# Patient Record
Sex: Male | Born: 1935
Health system: Southern US, Community
[De-identification: ages and names within clinical notes are randomized; demographics above are authoritative.]

## PROBLEM LIST (undated history)

## (undated) DIAGNOSIS — E7800 Pure hypercholesterolemia, unspecified: Secondary | ICD-10-CM

## (undated) DIAGNOSIS — B351 Tinea unguium: Secondary | ICD-10-CM

## (undated) DIAGNOSIS — R413 Other amnesia: Secondary | ICD-10-CM

## (undated) DIAGNOSIS — I10 Essential (primary) hypertension: Secondary | ICD-10-CM

## (undated) DIAGNOSIS — Z951 Presence of aortocoronary bypass graft: Secondary | ICD-10-CM

## (undated) DIAGNOSIS — F0390 Unspecified dementia without behavioral disturbance: Secondary | ICD-10-CM

## (undated) DIAGNOSIS — C61 Malignant neoplasm of prostate: Secondary | ICD-10-CM

## (undated) DIAGNOSIS — A498 Other bacterial infections of unspecified site: Secondary | ICD-10-CM

## (undated) DIAGNOSIS — I219 Acute myocardial infarction, unspecified: Secondary | ICD-10-CM

## (undated) DIAGNOSIS — N390 Urinary tract infection, site not specified: Secondary | ICD-10-CM

## (undated) DIAGNOSIS — I2510 Atherosclerotic heart disease of native coronary artery without angina pectoris: Secondary | ICD-10-CM

## (undated) DIAGNOSIS — H9190 Unspecified hearing loss, unspecified ear: Secondary | ICD-10-CM

## (undated) DIAGNOSIS — N139 Obstructive and reflux uropathy, unspecified: Secondary | ICD-10-CM

## (undated) HISTORY — DX: Essential (primary) hypertension: I10

## (undated) HISTORY — PX: MULTIPLE TOOTH EXTRACTIONS: SHX2053

## (undated) HISTORY — DX: Tinea unguium: B35.1

## (undated) HISTORY — DX: Pure hypercholesterolemia, unspecified: E78.00

## (undated) HISTORY — PX: CORONARY ARTERY BYPASS GRAFT: SHX141

## (undated) HISTORY — DX: Other bacterial infections of unspecified site: A49.8

## (undated) HISTORY — PX: APPENDECTOMY: SHX54

## (undated) HISTORY — DX: Acute myocardial infarction, unspecified: I21.9

## (undated) HISTORY — DX: Other amnesia: R41.3

## (undated) HISTORY — DX: Obstructive and reflux uropathy, unspecified: N13.9

## (undated) HISTORY — DX: Urinary tract infection, site not specified: N39.0

## (undated) HISTORY — DX: Unspecified hearing loss, unspecified ear: H91.90

---

## 1989-12-21 DIAGNOSIS — I2510 Atherosclerotic heart disease of native coronary artery without angina pectoris: Secondary | ICD-10-CM

## 1989-12-21 HISTORY — DX: Atherosclerotic heart disease of native coronary artery without angina pectoris: I25.10

## 1989-12-27 DIAGNOSIS — Z951 Presence of aortocoronary bypass graft: Secondary | ICD-10-CM

## 1989-12-27 DIAGNOSIS — I2510 Atherosclerotic heart disease of native coronary artery without angina pectoris: Secondary | ICD-10-CM | POA: Diagnosis present

## 1989-12-27 HISTORY — DX: Presence of aortocoronary bypass graft: Z95.1

## 1999-12-05 ENCOUNTER — Encounter: Payer: Self-pay | Admitting: Cardiology

## 1999-12-05 ENCOUNTER — Encounter: Admission: RE | Admit: 1999-12-05 | Discharge: 1999-12-05 | Payer: Self-pay | Admitting: Cardiology

## 2001-12-30 ENCOUNTER — Encounter: Admission: RE | Admit: 2001-12-30 | Discharge: 2001-12-30 | Payer: Self-pay | Admitting: Cardiology

## 2001-12-30 ENCOUNTER — Encounter: Payer: Self-pay | Admitting: Cardiology

## 2002-02-10 ENCOUNTER — Encounter (INDEPENDENT_AMBULATORY_CARE_PROVIDER_SITE_OTHER): Payer: Self-pay | Admitting: Specialist

## 2002-02-10 ENCOUNTER — Ambulatory Visit (HOSPITAL_COMMUNITY): Admission: RE | Admit: 2002-02-10 | Discharge: 2002-02-10 | Payer: Self-pay | Admitting: *Deleted

## 2004-03-21 ENCOUNTER — Encounter (INDEPENDENT_AMBULATORY_CARE_PROVIDER_SITE_OTHER): Payer: Self-pay | Admitting: *Deleted

## 2004-03-21 ENCOUNTER — Ambulatory Visit (HOSPITAL_COMMUNITY): Admission: RE | Admit: 2004-03-21 | Discharge: 2004-03-21 | Payer: Self-pay | Admitting: *Deleted

## 2005-03-07 ENCOUNTER — Encounter: Admission: RE | Admit: 2005-03-07 | Discharge: 2005-03-07 | Payer: Self-pay | Admitting: Urology

## 2006-09-03 ENCOUNTER — Ambulatory Visit (HOSPITAL_COMMUNITY): Admission: RE | Admit: 2006-09-03 | Discharge: 2006-09-03 | Payer: Self-pay | Admitting: *Deleted

## 2008-02-15 ENCOUNTER — Encounter: Admission: RE | Admit: 2008-02-15 | Discharge: 2008-02-15 | Payer: Self-pay | Admitting: Internal Medicine

## 2008-04-09 ENCOUNTER — Encounter: Admission: RE | Admit: 2008-04-09 | Discharge: 2008-04-09 | Payer: Self-pay | Admitting: Internal Medicine

## 2008-07-01 ENCOUNTER — Encounter: Admission: RE | Admit: 2008-07-01 | Discharge: 2008-07-01 | Payer: Self-pay | Admitting: Internal Medicine

## 2009-11-07 ENCOUNTER — Encounter: Admission: RE | Admit: 2009-11-07 | Discharge: 2010-01-04 | Payer: Self-pay | Admitting: *Deleted

## 2010-12-08 NOTE — Op Note (Signed)
NAME:  Henry Curtis, Henry Curtis                 ACCOUNT NO.:  [id number]   MEDICAL RECORD NO.:  [id number]          PATIENT TYPE:  AMB   LOCATION:  ENDO                         FACILITY:  [facility]   PHYSICIAN:  Georgiana Spinner, M.D.    DATE OF BIRTH:  Apr 21, 1922   DATE OF PROCEDURE:  09/03/2006  DATE OF DISCHARGE:                               OPERATIVE REPORT   PROCEDURE:  Colonoscopy.   INDICATIONS:  Colon polyps.   ANESTHESIA:  Fentanyl 75 mcg and Versed 6 mg.   PROCEDURE:  With the patient mildly sedated in the left lateral  decubitus position, the Pentax videoscopic colonoscope was inserted in  the rectum, passed through an area of diverticular filled sigmoid colon  to reach the cecum, identified by ileocecal valve and base of cecum.  We  could not get fully into the cecum but we were able to visualize it.  No  gross lesions were seen, despite turning to his back and right side and  with pressure applied, so elected from that point to withdraw the  colonoscope taking circumferential views of colonic mucosa stopping only  in the rectum which appeared normal on direct and showed hemorrhoids on  retroflexed view.  The endoscope was straightened and withdrawn.  The  patient's vital signs and pulse oximeter remained stable.  The patient  tolerated procedure well without apparent complications.   FINDINGS:  Diverticulosis of sigmoid colon, moderately severe, internal  hemorrhoids.   PLAN:  Repeat examination possibly in 5 years           ______________________________  Georgiana Spinner, M.D.     GMO/MEDQ  D:  09/03/2006  T:  09/03/2006  Job:  [id number]

## 2010-12-08 NOTE — Op Note (Signed)
NAME:  Henry Curtis, Henry Curtis                           ACCOUNT NO.:  [id number]   MEDICAL RECORD NO.:  [id number]                   PATIENT TYPE:  AMB   LOCATION:  ENDO                                 FACILITY:  [facility]   PHYSICIAN:  Georgiana Spinner, M.D.                 DATE OF BIRTH:  May 10, 1922   DATE OF PROCEDURE:  DATE OF DISCHARGE:                                 OPERATIVE REPORT   PROCEDURE:  Colonoscopy with biopsy.   INDICATIONS FOR PROCEDURE:  Colon polyps.   ANESTHESIA:  Demerol 60, Versed 6 mg.   DESCRIPTION OF PROCEDURE:  With the patient mildly sedated in the left  lateral decubitus position, rectal examination was performed which was  unremarkable.  Subsequently the Olympus videoscopic colonoscope was inserted  in the rectum, passed under direct vision to the ascending colon and after  numerous position changes and pressure on the abdomen, we were able to reach  to the cecum identified by the __________  and ileocecal valve both of which  were photographed.  From this point, the colonoscope was slowly withdrawn,  taking circumferential views of the colonic mucosa __________  as we  withdrew all the way to the rectum and the area of the hepatic flexure where  a polyp was seen, photographed and removed with hot biopsy forceps  technique, setting of 20/200 with the ERBE pulse generator __________  splenic flexure where a second polyp distal to the bladder, sessile polyp.  It too was photographed.  This too was removed with same hot biopsy forceps  technique.  We then withdrew all the way to the rectum which appeared normal  __________  .  The endoscope was straightened __________  .  The patient's  vital signs __________  .                                               Georgiana Spinner, M.D.    GMO/MEDQ  D:  03/21/2004  T:  03/21/2004  Job:  [id number]   cc:   Al Decant. Janey Greaser, MD  [address]  [place]  [place] [address]  Fax: [phone]

## 2010-12-08 NOTE — Procedures (Signed)
[place]  Patient:    Henry Curtis, Henry Curtis Visit Number: [id number] MRN: [id number]          Service Type: Attending:  Sabino Gasser, M.D. Dictated by:   Sabino Gasser, M.D.                             Procedure Report  PROCEDURE:  Colonoscopy with polypectomy and biopsy.  SURGEON:  Sabino Gasser, M.D.  INDICATIONS:  Colon polyps.  ANESTHESIA:  None further given.  DESCRIPTION OF PROCEDURE:  With the patient mildly sedated in the left lateral decubitus position, the Olympus videoscopic colonoscope was inserted in the rectum and passed under direct vision to the cecum, identified by the ileocecal valve and appendiceal orifice.  In the cecum was a small polyp that was removed using cold biopsies.  Subsequently, the endoscope was then slowly withdrawn, taking circumferential views of the remaining colonic mucosa, stopping then next in the cecum where polyps were adjacent to the ileocecal valve, and these were removed after being photographed.  They were removed using snare cautery technique with a setting of 20/20 blunted current.  All the tissue was retrieved.  We next stopped in the sigmoid colon where a larger polyp at approximately 25 cm was seen and removed using snare cautery technique.  This was done and the tissue retrieved.  The endoscope was withdrawn all the way to the rectum as mentioned, which otherwise appeared normal on direct and retroflexed view.  The endoscope was then straightened and withdrawn.  The patients vital signs and pulse oximeter remained stable.  The patient tolerated the procedure well without apparent complications.  FINDINGS:  Polyps of cecum and sigmoid.  PLAN:  Await biopsy report.  The patient will call me for results and follow up with me as an outpatient. Dictated by:   Sabino Gasser, M.D. Attending:  Sabino Gasser, M.D. DD:  02/10/02 TD:  02/13/02 Job: [id number]

## 2011-02-08 ENCOUNTER — Encounter: Payer: Self-pay | Admitting: Podiatry

## 2011-05-28 ENCOUNTER — Other Ambulatory Visit (HOSPITAL_COMMUNITY): Payer: Self-pay | Admitting: Urology

## 2011-05-28 DIAGNOSIS — C61 Malignant neoplasm of prostate: Secondary | ICD-10-CM

## 2011-06-04 ENCOUNTER — Encounter (HOSPITAL_COMMUNITY)
Admission: RE | Admit: 2011-06-04 | Discharge: 2011-06-04 | Disposition: A | Discharge status: Home / Self Care | Payer: Medicare Other | Source: Ambulatory Visit | Attending: Urology | Admitting: Urology

## 2011-06-04 DIAGNOSIS — C61 Malignant neoplasm of prostate: Secondary | ICD-10-CM

## 2011-06-04 MED ORDER — TECHNETIUM TC 99M MEDRONATE IV KIT: 25.0000 | PACK | Freq: Once | INTRAVENOUS | Status: DC | PRN

## 2011-07-30 ENCOUNTER — Ambulatory Visit: Payer: Medicare Other | Attending: Internal Medicine | Admitting: Physical Therapy

## 2011-07-30 DIAGNOSIS — R269 Unspecified abnormalities of gait and mobility: Secondary | ICD-10-CM | POA: Insufficient documentation

## 2011-07-30 DIAGNOSIS — IMO0001 Reserved for inherently not codable concepts without codable children: Secondary | ICD-10-CM | POA: Insufficient documentation

## 2011-08-06 ENCOUNTER — Ambulatory Visit: Payer: Medicare Other | Admitting: Physical Therapy

## 2011-08-10 ENCOUNTER — Ambulatory Visit: Payer: Medicare Other | Admitting: Physical Therapy

## 2011-08-13 ENCOUNTER — Ambulatory Visit: Payer: Medicare Other | Admitting: Physical Therapy

## 2011-08-15 ENCOUNTER — Ambulatory Visit: Payer: Medicare Other | Admitting: Physical Therapy

## 2011-08-16 ENCOUNTER — Encounter: Payer: Medicare Other | Admitting: Physical Therapy

## 2011-08-20 ENCOUNTER — Ambulatory Visit: Payer: Medicare Other | Admitting: Physical Therapy

## 2011-08-24 ENCOUNTER — Ambulatory Visit: Payer: Medicare Other | Attending: Internal Medicine | Admitting: Physical Therapy

## 2011-08-24 DIAGNOSIS — R269 Unspecified abnormalities of gait and mobility: Secondary | ICD-10-CM | POA: Insufficient documentation

## 2011-08-24 DIAGNOSIS — IMO0001 Reserved for inherently not codable concepts without codable children: Secondary | ICD-10-CM | POA: Insufficient documentation

## 2011-08-27 ENCOUNTER — Ambulatory Visit: Payer: Medicare Other | Admitting: Physical Therapy

## 2011-08-30 ENCOUNTER — Ambulatory Visit: Payer: Medicare Other | Admitting: Physical Therapy

## 2011-09-03 ENCOUNTER — Ambulatory Visit: Payer: Medicare Other | Admitting: Physical Therapy

## 2011-09-10 ENCOUNTER — Ambulatory Visit: Payer: Medicare Other | Admitting: Physical Therapy

## 2011-09-19 ENCOUNTER — Ambulatory Visit: Payer: Medicare Other | Admitting: Physical Therapy

## 2011-09-21 ENCOUNTER — Ambulatory Visit: Payer: Medicare Other | Attending: Internal Medicine | Admitting: Physical Therapy

## 2011-09-21 DIAGNOSIS — R269 Unspecified abnormalities of gait and mobility: Secondary | ICD-10-CM | POA: Insufficient documentation

## 2011-09-21 DIAGNOSIS — IMO0001 Reserved for inherently not codable concepts without codable children: Secondary | ICD-10-CM | POA: Insufficient documentation

## 2011-09-26 ENCOUNTER — Ambulatory Visit: Payer: Medicare Other | Admitting: Physical Therapy

## 2011-09-28 ENCOUNTER — Ambulatory Visit: Payer: Medicare Other | Admitting: Physical Therapy

## 2011-10-02 ENCOUNTER — Other Ambulatory Visit: Payer: Self-pay | Admitting: Internal Medicine

## 2011-10-02 DIAGNOSIS — M25562 Pain in left knee: Secondary | ICD-10-CM

## 2011-10-03 ENCOUNTER — Encounter: Payer: Medicare Other | Admitting: Physical Therapy

## 2011-10-05 ENCOUNTER — Ambulatory Visit
Admission: RE | Admit: 2011-10-05 | Discharge: 2011-10-05 | Disposition: A | Discharge status: Home / Self Care | Payer: Medicare Other | Source: Ambulatory Visit | Attending: Internal Medicine | Admitting: Internal Medicine

## 2011-10-05 ENCOUNTER — Encounter: Payer: Medicare Other | Admitting: Physical Therapy

## 2011-10-05 DIAGNOSIS — M25562 Pain in left knee: Secondary | ICD-10-CM

## 2011-10-10 ENCOUNTER — Encounter: Payer: Medicare Other | Admitting: Physical Therapy

## 2011-10-12 ENCOUNTER — Encounter: Payer: Medicare Other | Admitting: Physical Therapy

## 2013-01-19 ENCOUNTER — Other Ambulatory Visit: Payer: Self-pay | Admitting: Urology

## 2013-01-19 DIAGNOSIS — C61 Malignant neoplasm of prostate: Secondary | ICD-10-CM

## 2013-04-06 ENCOUNTER — Encounter (HOSPITAL_COMMUNITY): Payer: Self-pay

## 2013-04-06 ENCOUNTER — Encounter (HOSPITAL_COMMUNITY)
Admission: RE | Admit: 2013-04-06 | Discharge: 2013-04-06 | Disposition: A | Discharge status: Home / Self Care | Payer: Medicare Other | Source: Ambulatory Visit | Attending: Urology | Admitting: Urology

## 2013-04-06 DIAGNOSIS — C61 Malignant neoplasm of prostate: Secondary | ICD-10-CM | POA: Insufficient documentation

## 2013-04-06 HISTORY — DX: Malignant neoplasm of prostate: C61

## 2013-04-06 MED ORDER — TECHNETIUM TC 99M MEDRONATE IV KIT
25.0000 | PACK | Freq: Once | INTRAVENOUS | Status: AC | PRN
  Administered 2013-04-06: 25 via INTRAVENOUS

## 2013-05-13 ENCOUNTER — Ambulatory Visit: Payer: Self-pay | Admitting: Podiatry

## 2013-06-08 ENCOUNTER — Ambulatory Visit: Payer: Self-pay | Admitting: Podiatry

## 2013-06-08 ENCOUNTER — Ambulatory Visit (INDEPENDENT_AMBULATORY_CARE_PROVIDER_SITE_OTHER): Payer: Medicare Other | Admitting: Podiatry

## 2013-06-08 ENCOUNTER — Encounter: Payer: Self-pay | Admitting: Podiatry

## 2013-06-08 VITALS — BP 151/82 | HR 76 | Resp 24 | Ht 68.0 in | Wt 180.0 lb

## 2013-06-08 DIAGNOSIS — B351 Tinea unguium: Secondary | ICD-10-CM

## 2013-06-08 DIAGNOSIS — M79609 Pain in unspecified limb: Secondary | ICD-10-CM

## 2013-06-08 NOTE — Patient Instructions (Signed)
Apply triple antibiotic ointment to the fourth right toe nail daily, cover with Band-Aid until healed.

## 2013-06-08 NOTE — Progress Notes (Signed)
Patient ID: Henry Curtis, male   DOB: November 09, 1921, 79 y.o.   MRN: [id number]  Subjective: He 79 year-old diabetic male presents today requesting debridement of painful toenails. His last visit was 07/31/2012.  Objective: Incurvated, deformed, discolored toenails with palpable tenderness in all nail plates.  Assessment: Symptomatic onychomycoses x10 Diabetes  Plan: All 10 toenails are debrided back down to bleeding. Reappoint at three-month intervals recommended.

## 2013-09-07 ENCOUNTER — Ambulatory Visit: Payer: Medicare Other | Admitting: Podiatry

## 2013-11-23 ENCOUNTER — Ambulatory Visit (INDEPENDENT_AMBULATORY_CARE_PROVIDER_SITE_OTHER): Payer: Medicare Other | Admitting: Podiatry

## 2013-11-23 ENCOUNTER — Encounter: Payer: Self-pay | Admitting: Podiatry

## 2013-11-23 DIAGNOSIS — M79609 Pain in unspecified limb: Secondary | ICD-10-CM

## 2013-11-23 DIAGNOSIS — B351 Tinea unguium: Secondary | ICD-10-CM

## 2013-11-23 DIAGNOSIS — L84 Corns and callosities: Secondary | ICD-10-CM

## 2013-11-24 NOTE — Progress Notes (Signed)
Subjective:     Patient ID: Henry Curtis, male   DOB: 05/04/1922, 80 y.o.   MRN: [id number]  HPI patient presents with nail disease and pain 1-5 both feet and lesion fifth toe left it's painful   Review of Systems     Objective:   Physical Exam Neurovascular status intact with patient well oriented x3 and found to have brittle painful nail bed 1-5 both feet and keratotic lesions that are painful    Assessment:     Mycotic nail infection with pain 1-5 both feet and lesion formation    Plan:     Debridement of nailbeds 1-5 both feet and lesions on both feet with no iatrogenic bleeding noted

## 2014-01-14 ENCOUNTER — Ambulatory Visit: Payer: Medicare Other | Admitting: Podiatry

## 2014-02-08 ENCOUNTER — Ambulatory Visit (INDEPENDENT_AMBULATORY_CARE_PROVIDER_SITE_OTHER): Payer: Medicare Other | Admitting: Podiatry

## 2014-02-08 DIAGNOSIS — B351 Tinea unguium: Secondary | ICD-10-CM

## 2014-02-08 DIAGNOSIS — M79673 Pain in unspecified foot: Secondary | ICD-10-CM

## 2014-02-08 DIAGNOSIS — M79609 Pain in unspecified limb: Secondary | ICD-10-CM

## 2014-02-08 NOTE — Progress Notes (Signed)
   Subjective:    Patient ID: Henry Curtis, male    DOB: 1922/07/21, 80 y.o.   MRN: [id number]  HPI Pt needs nail trim   Review of Systems     Objective:   Physical Exam        Assessment & Plan:

## 2014-02-10 NOTE — Progress Notes (Signed)
Subjective:     Patient ID: Henry Curtis, male   DOB: [birth year], 80 y.o.   MRN: [id number]  HPI patient presents with thick yellow nailbeds 1-5 both feet that are impossible for him to cut   Review of Systems     Objective:   Physical Exam Neurovascular status intact with no did nails that are thick E. long gaited painful and brittle when pressed    Assessment:     Mycotic nail infection with pain 1-5 both feet    Plan:     Debris painful nailbeds 1-5 of both feet

## 2014-04-19 ENCOUNTER — Ambulatory Visit: Payer: Medicare Other | Admitting: Podiatry

## 2014-04-19 ENCOUNTER — Ambulatory Visit (INDEPENDENT_AMBULATORY_CARE_PROVIDER_SITE_OTHER): Payer: Medicare Other | Admitting: Podiatry

## 2014-04-19 DIAGNOSIS — M79609 Pain in unspecified limb: Secondary | ICD-10-CM

## 2014-04-19 DIAGNOSIS — B351 Tinea unguium: Secondary | ICD-10-CM

## 2014-04-19 DIAGNOSIS — M79673 Pain in unspecified foot: Secondary | ICD-10-CM

## 2014-04-19 NOTE — Progress Notes (Signed)
   Subjective:    Patient ID: Henry Curtis, male    DOB: 06/11/1922, 80 y.o.   MRN: [id number]  HPI Pt presents for nail debridement   Review of Systems     Objective:   Physical Exam        Assessment & Plan:

## 2014-04-19 NOTE — Progress Notes (Signed)
Subjective:     Patient ID: Henry Curtis, male   DOB: 04-23-1922, 80 y.o.   MRN: [id number]  HPI patient presents with thick yellow brittle nailbeds 1-5 both feet   Review of Systems     Objective:   Physical Exam Neurovascular status intact with  thick brittle toenails 1-5 both feet that are painful when pressed    Assessment:     Mycotic nail infection with pain 1-5 both feet    Plan:     Debride painful nailbeds 1-5 both feet with no iatrogenic bleeding noted

## 2014-06-23 ENCOUNTER — Inpatient Hospital Stay (HOSPITAL_COMMUNITY): Payer: Medicare Other

## 2014-06-23 ENCOUNTER — Emergency Department (HOSPITAL_COMMUNITY): Payer: Medicare Other

## 2014-06-23 ENCOUNTER — Encounter (HOSPITAL_COMMUNITY): Payer: Self-pay | Admitting: *Deleted

## 2014-06-23 ENCOUNTER — Inpatient Hospital Stay (HOSPITAL_COMMUNITY)
Admission: EM | Admit: 2014-06-23 | Discharge: 2014-06-29 | DRG: 246 | Disposition: A | Discharge status: Skilled Nursing Facility | Payer: Medicare Other | Attending: Internal Medicine | Admitting: Internal Medicine

## 2014-06-23 DIAGNOSIS — R9431 Abnormal electrocardiogram [ECG] [EKG]: Secondary | ICD-10-CM | POA: Diagnosis present

## 2014-06-23 DIAGNOSIS — I252 Old myocardial infarction: Secondary | ICD-10-CM

## 2014-06-23 DIAGNOSIS — E860 Dehydration: Secondary | ICD-10-CM | POA: Diagnosis present

## 2014-06-23 DIAGNOSIS — Z8546 Personal history of malignant neoplasm of prostate: Secondary | ICD-10-CM | POA: Diagnosis not present

## 2014-06-23 DIAGNOSIS — G9340 Encephalopathy, unspecified: Secondary | ICD-10-CM | POA: Diagnosis present

## 2014-06-23 DIAGNOSIS — I25810 Atherosclerosis of coronary artery bypass graft(s) without angina pectoris: Secondary | ICD-10-CM | POA: Diagnosis present

## 2014-06-23 DIAGNOSIS — Z79899 Other long term (current) drug therapy: Secondary | ICD-10-CM | POA: Diagnosis not present

## 2014-06-23 DIAGNOSIS — R000 Tachycardia, unspecified: Secondary | ICD-10-CM | POA: Diagnosis not present

## 2014-06-23 DIAGNOSIS — D649 Anemia, unspecified: Secondary | ICD-10-CM | POA: Diagnosis present

## 2014-06-23 DIAGNOSIS — A0472 Enterocolitis due to Clostridium difficile, not specified as recurrent: Secondary | ICD-10-CM | POA: Diagnosis not present

## 2014-06-23 DIAGNOSIS — I2582 Chronic total occlusion of coronary artery: Secondary | ICD-10-CM | POA: Diagnosis present

## 2014-06-23 DIAGNOSIS — I2510 Atherosclerotic heart disease of native coronary artery without angina pectoris: Secondary | ICD-10-CM | POA: Diagnosis present

## 2014-06-23 DIAGNOSIS — A048 Other specified bacterial intestinal infections: Secondary | ICD-10-CM | POA: Diagnosis not present

## 2014-06-23 DIAGNOSIS — E876 Hypokalemia: Secondary | ICD-10-CM | POA: Diagnosis not present

## 2014-06-23 DIAGNOSIS — I9589 Other hypotension: Secondary | ICD-10-CM | POA: Diagnosis present

## 2014-06-23 DIAGNOSIS — I214 Non-ST elevation (NSTEMI) myocardial infarction: Secondary | ICD-10-CM | POA: Insufficient documentation

## 2014-06-23 DIAGNOSIS — N390 Urinary tract infection, site not specified: Secondary | ICD-10-CM | POA: Diagnosis present

## 2014-06-23 DIAGNOSIS — R339 Retention of urine, unspecified: Secondary | ICD-10-CM | POA: Diagnosis not present

## 2014-06-23 DIAGNOSIS — A047 Enterocolitis due to Clostridium difficile: Secondary | ICD-10-CM | POA: Diagnosis not present

## 2014-06-23 DIAGNOSIS — Z87891 Personal history of nicotine dependence: Secondary | ICD-10-CM | POA: Diagnosis not present

## 2014-06-23 DIAGNOSIS — R5381 Other malaise: Secondary | ICD-10-CM | POA: Diagnosis present

## 2014-06-23 DIAGNOSIS — Z951 Presence of aortocoronary bypass graft: Secondary | ICD-10-CM

## 2014-06-23 DIAGNOSIS — J810 Acute pulmonary edema: Secondary | ICD-10-CM | POA: Diagnosis not present

## 2014-06-23 DIAGNOSIS — I10 Essential (primary) hypertension: Secondary | ICD-10-CM | POA: Diagnosis present

## 2014-06-23 DIAGNOSIS — N179 Acute kidney failure, unspecified: Secondary | ICD-10-CM | POA: Diagnosis present

## 2014-06-23 DIAGNOSIS — R410 Disorientation, unspecified: Secondary | ICD-10-CM

## 2014-06-23 DIAGNOSIS — R338 Other retention of urine: Secondary | ICD-10-CM | POA: Diagnosis present

## 2014-06-23 DIAGNOSIS — I219 Acute myocardial infarction, unspecified: Secondary | ICD-10-CM

## 2014-06-23 HISTORY — DX: Presence of aortocoronary bypass graft: Z95.1

## 2014-06-23 HISTORY — DX: Atherosclerotic heart disease of native coronary artery without angina pectoris: I25.10

## 2014-06-23 LAB — COMPREHENSIVE METABOLIC PANEL
ALT: 19 U/L (ref 0–53)
AST: 42 U/L — ABNORMAL HIGH (ref 0–37)
Albumin: 3.2 g/dL — ABNORMAL LOW (ref 3.5–5.2)
Alkaline Phosphatase: 60 U/L (ref 39–117)
Anion gap: 13 (ref 5–15)
BUN: 34 mg/dL — ABNORMAL HIGH (ref 6–23)
CALCIUM: 9 mg/dL (ref 8.4–10.5)
CO2: 24 meq/L (ref 19–32)
CREATININE: 1.41 mg/dL — AB (ref 0.50–1.35)
Chloride: 102 mEq/L (ref 96–112)
GFR, EST AFRICAN AMERICAN: 49 mL/min — AB (ref >90)
GFR, EST NON AFRICAN AMERICAN: 43 mL/min — AB (ref >90)
GLUCOSE: 104 mg/dL — AB (ref 70–99)
Potassium: 4 mEq/L (ref 3.7–5.3)
Sodium: 139 mEq/L (ref 137–147)
Total Bilirubin: 0.4 mg/dL (ref 0.3–1.2)
Total Protein: 7 g/dL (ref 6.0–8.3)

## 2014-06-23 LAB — URINE MICROSCOPIC-ADD ON

## 2014-06-23 LAB — CBC WITH DIFFERENTIAL/PLATELET
Basophils Absolute: 0 10*3/uL (ref 0.0–0.1)
Basophils Relative: 0 % (ref 0–1)
EOS PCT: 2 % (ref 0–5)
Eosinophils Absolute: 0.2 10*3/uL (ref 0.0–0.7)
HEMATOCRIT: 36.4 % — AB (ref 39.0–52.0)
Hemoglobin: 12.4 g/dL — ABNORMAL LOW (ref 13.0–17.0)
LYMPHS ABS: 2 10*3/uL (ref 0.7–4.0)
LYMPHS PCT: 19 % (ref 12–46)
MCH: 32.1 pg (ref 26.0–34.0)
MCHC: 34.1 g/dL (ref 30.0–36.0)
MCV: 94.3 fL (ref 78.0–100.0)
MONO ABS: 1.7 10*3/uL — AB (ref 0.1–1.0)
Monocytes Relative: 17 % — ABNORMAL HIGH (ref 3–12)
Neutro Abs: 6.5 10*3/uL (ref 1.7–7.7)
Neutrophils Relative %: 62 % (ref 43–77)
Platelets: 159 10*3/uL (ref 150–400)
RBC: 3.86 MIL/uL — AB (ref 4.22–5.81)
RDW: 13.1 % (ref 11.5–15.5)
WBC: 10.3 10*3/uL (ref 4.0–10.5)

## 2014-06-23 LAB — URINALYSIS, ROUTINE W REFLEX MICROSCOPIC
Glucose, UA: NEGATIVE mg/dL
Ketones, ur: 15 mg/dL — AB
LEUKOCYTES UA: NEGATIVE
Nitrite: NEGATIVE
PROTEIN: 30 mg/dL — AB
Specific Gravity, Urine: 1.027 (ref 1.005–1.030)
UROBILINOGEN UA: 1 mg/dL (ref 0.0–1.0)
pH: 5 (ref 5.0–8.0)

## 2014-06-23 LAB — I-STAT TROPONIN, ED: TROPONIN I, POC: 8.58 ng/mL — AB (ref 0.00–0.08)

## 2014-06-23 LAB — TROPONIN I
Troponin I: 6.19 ng/mL (ref <0.30)
Troponin I: 6.91 ng/mL (ref <0.30)

## 2014-06-23 MED ORDER — LOSARTAN POTASSIUM 50 MG PO TABS
100.0000 mg | ORAL_TABLET | Freq: Every day | ORAL | Status: DC
  Filled 2014-06-23: qty 2

## 2014-06-23 MED ORDER — ONDANSETRON HCL 4 MG/2ML IJ SOLN: 4.0000 mg | Freq: Four times a day (QID) | INTRAMUSCULAR | Status: DC | PRN

## 2014-06-23 MED ORDER — ASPIRIN 81 MG PO CHEW
324.0000 mg | CHEWABLE_TABLET | Freq: Once | ORAL | Status: AC
  Administered 2014-06-23: 324 mg via ORAL
  Filled 2014-06-23: qty 4

## 2014-06-23 MED ORDER — SODIUM CHLORIDE 0.9 % IV SOLN: INTRAVENOUS | Status: AC

## 2014-06-23 MED ORDER — HEPARIN BOLUS VIA INFUSION
3000.0000 [IU] | Freq: Once | INTRAVENOUS | Status: AC
  Administered 2014-06-23: 3000 [IU] via INTRAVENOUS
  Filled 2014-06-23: qty 3000

## 2014-06-23 MED ORDER — SODIUM CHLORIDE 0.9 % IJ SOLN
3.0000 mL | Freq: Two times a day (BID) | INTRAMUSCULAR | Status: DC
  Administered 2014-06-23 – 2014-06-26 (×2): 3 mL via INTRAVENOUS

## 2014-06-23 MED ORDER — ASPIRIN EC 325 MG PO TBEC
325.0000 mg | DELAYED_RELEASE_TABLET | Freq: Every day | ORAL | Status: DC
  Administered 2014-06-24 – 2014-06-29 (×5): 325 mg via ORAL
  Filled 2014-06-23 (×6): qty 1

## 2014-06-23 MED ORDER — ATORVASTATIN CALCIUM 10 MG PO TABS: 10.0000 mg | ORAL_TABLET | Freq: Every day | ORAL | Status: DC

## 2014-06-23 MED ORDER — ONDANSETRON HCL 4 MG PO TABS: 4.0000 mg | ORAL_TABLET | Freq: Four times a day (QID) | ORAL | Status: DC | PRN

## 2014-06-23 MED ORDER — HEPARIN (PORCINE) IN NACL 100-0.45 UNIT/ML-% IJ SOLN
1100.0000 [IU]/h | INTRAMUSCULAR | Status: DC
  Administered 2014-06-24: 10000 [IU]/h via INTRAVENOUS
  Filled 2014-06-23 (×10): qty 250

## 2014-06-23 MED ORDER — CEFTRIAXONE SODIUM IN DEXTROSE 20 MG/ML IV SOLN
1.0000 g | Freq: Every day | INTRAVENOUS | Status: DC
  Administered 2014-06-24 – 2014-06-27 (×5): 1 g via INTRAVENOUS
  Filled 2014-06-23 (×7): qty 50

## 2014-06-23 MED ORDER — SODIUM CHLORIDE 0.9 % IV SOLN
INTRAVENOUS | Status: AC
  Administered 2014-06-24: 75 mL/h via INTRAVENOUS

## 2014-06-23 MED ORDER — ATORVASTATIN CALCIUM 80 MG PO TABS
80.0000 mg | ORAL_TABLET | Freq: Every day | ORAL | Status: DC
  Administered 2014-06-24 – 2014-06-28 (×5): 80 mg via ORAL
  Filled 2014-06-23 (×6): qty 1

## 2014-06-23 MED ORDER — ACETAMINOPHEN 325 MG PO TABS
650.0000 mg | ORAL_TABLET | Freq: Four times a day (QID) | ORAL | Status: DC | PRN
  Administered 2014-06-29: 650 mg via ORAL
  Filled 2014-06-23: qty 2

## 2014-06-23 MED ORDER — ACETAMINOPHEN 650 MG RE SUPP: 650.0000 mg | Freq: Four times a day (QID) | RECTAL | Status: DC | PRN

## 2014-06-23 MED ORDER — METOPROLOL TARTRATE 12.5 MG HALF TABLET
12.5000 mg | ORAL_TABLET | Freq: Two times a day (BID) | ORAL | Status: DC
  Filled 2014-06-23 (×3): qty 1

## 2014-06-23 NOTE — ED Notes (Signed)
Dr. Kakrakandy at bedside. 

## 2014-06-23 NOTE — ED Notes (Signed)
PT arrived via [facility] from [facility]. [facility] sent him via ambulance rt new onset of inverted T waves on EKG and a UTI. EMS reports pt denies any SOB, dizziness, chest pain. Daughter reported to EMS the pt was confused on Monday, but seems better today. PT is alert and oriented x4.

## 2014-06-23 NOTE — ED Notes (Signed)
Jeneen Rinks, MD aware of abnormal lab test results

## 2014-06-23 NOTE — ED Notes (Signed)
Report attempted 

## 2014-06-23 NOTE — H&P (Signed)
[facility] History and Physical  ADONYS WILDES GNF:[id number] DOB: 1922/01/30 DOA: 06/23/2014  Referring physician: ER physician. PCP: Tommy Medal, MD   Chief Complaint: Referred by PCP for abnormal EKG.  HPI: Henry Curtis is a 80 y.o. male with history of CAD status post CABG and hypertension was found to be confused and not getting out of his bed by patient the daughter. Patient's daughter drove from [place] to check on her dad and took patient to his PCP to today. At the PCPs office patient's UA showing features concerning for UTI and EKG was showing ST depression and referred to the ER. In the ER patient was found to be confused at times and CTA did not show any acute and UA was not showing clear signs of UTI. Patient's troponin came positive and on-call cardiologist was consulted. EKG was showing ST depression. Patient otherwise denies any chest pain or shortness of breath. On exam patient is following commands and is presently alert awake oriented to time place and person. Patient daughter states that he usually is independent and and patient not ambulating and being active is a big change. As per the patient's daughter of recent patient has become more forgetful.  Review of Systems: As presented in the history of presenting illness, rest negative.  Past Medical History  Diagnosis Date  . Prostate cancer   . Hypertension   . Myocardial infarction    Past Surgical History  Procedure Laterality Date  . Appendectomy    . Multiple tooth extractions Bilateral   . Coronary artery bypass graft     Social History:  reports that he has quit smoking. He does not have any smokeless tobacco history on file. He reports that he does not drink alcohol or use illicit drugs. Where does patient live home. Can patient participate in ADLs? Yes.  No Known Allergies  Family History:  Family History  Problem Relation Age of Onset  . Diabetes Father   . Deep vein thrombosis Father        Prior to Admission medications   Medication Sig Start Date End Date Taking? Authorizing Provider  losartan (COZAAR) 100 MG tablet Take 100 mg by mouth daily.   Yes Historical Provider, MD    Physical Exam: Filed Vitals:   06/23/14 2045 06/23/14 2100 06/23/14 2115 06/23/14 2149  BP: 98/64 105/54 106/75 113/60  Pulse: 55 60 146 55  Temp:    98.1 F (36.7 C)  TempSrc:    Oral  Resp: 21 21 17 18   Height:      Weight:    76.159 kg (167 lb 14.4 oz)  SpO2: 97% 97% 98% 99%     General:  Well-developed and nourished.  Eyes: Anicteric no pallor.  ENT: No discharge from the ears eyes nose mouth.  Neck: No mass felt. No neck rigidity.  Cardiovascular: S1-S2 heard.  Respiratory: No rhonchi or crepitations.  Abdomen: Soft nontender bowel sounds present.  Skin: No rash.  Musculoskeletal: No edema.  Psychiatric: Patient presently is alert awake and oriented to time place and person.  Neurologic: Alert awake oriented to time place and person. Moves all extremities.  Labs on Admission:  Basic Metabolic Panel:  Recent Labs Lab 06/23/14 1800  NA 139  K 4.0  CL 102  CO2 24  GLUCOSE 104*  BUN 34*  CREATININE 1.41*  CALCIUM 9.0   Liver Function Tests:  Recent Labs Lab 06/23/14 1800  AST 42*  ALT 19  ALKPHOS 60  BILITOT 0.4  PROT 7.0  ALBUMIN 3.2*   No results for input(s): LIPASE, AMYLASE in the last 168 hours. No results for input(s): AMMONIA in the last 168 hours. CBC:  Recent Labs Lab 06/23/14 1800  WBC 10.3  NEUTROABS 6.5  HGB 12.4*  HCT 36.4*  MCV 94.3  PLT 159   Cardiac Enzymes:  Recent Labs Lab 06/23/14 1800  TROPONINI 6.19*    BNP (last 3 results) No results for input(s): PROBNP in the last 8760 hours. CBG: No results for input(s): GLUCAP in the last 168 hours.  Radiological Exams on Admission: Ct Head Wo Contrast  06/23/2014   CLINICAL DATA:  Mental status change.  Confusion.  EXAM: CT HEAD WITHOUT CONTRAST  TECHNIQUE: Contiguous  axial images were obtained from the base of the skull through the vertex without intravenous contrast.  COMPARISON:  None.  FINDINGS: Stable age related cerebral atrophy, ventriculomegaly and periventricular white matter disease. No extra-axial fluid collections are identified. No CT findings for acute hemispheric infarction or intracranial hemorrhage. No mass lesions. The brainstem and cerebellum are normal.  The No acute bony findings. The paranasal sinuses and mastoid air cells are clear. The globes are intact.  IMPRESSION: Age related cerebral atrophy, ventriculomegaly periventricular white matter disease. No acute intracranial findings or mass lesions.   Electronically Signed   By: Kalman Jewels M.D.   On: 06/23/2014 19:55    EKG: Independently reviewed. Normal sinus rhythm with ST depression in the lateral leads.  Assessment/Plan Active Problems:   Non-ST elevation MI (NSTEMI)   Acute renal failure   Acute encephalopathy   Essential hypertension   Non-ST elevation (NSTEMI) myocardial infarction   1. Non-ST elevation MI - I have discussed with on-call cardiologist Dr. Jules Husbands who has advised to start patient on heparin infusion and aspirin. Patient will be on statins and beta blocker. Nothing by mouth in anticipation of possible cardiac cath. Cycle cardiac markers check 2-D echo. 2. Acute encephalopathy status confusional state - probably could be from #1. Since it is acute change as per the family will check MRI brain. As per patient's daughter UA done at patient's PCPs office was showing features concerning for UTI but in the hospital it was equivocal. Could also be from patient developing memory issues. 3. Possible UTI - patient has been placed on ceftriaxone. Check urine culture. 4. Renal failure probably acute no baseline creatinine to compare - patient is getting gentle hydration and if creatinine does not improve may have to hold his ARB. 5. Hypertension - continue home  medications. See #4.    Code Status: Full code.  Family Communication: Patient's daughter at the bedside.  Disposition Plan: Admit to inpatient.    Vegas Fritze N. [facility] Pager [phone].  If 7PM-7AM, please contact night-coverage [email or web address] Password [facility] 06/23/2014, 10:51 PM

## 2014-06-23 NOTE — Consult Note (Signed)
Reason for Consult: NSTEMI Referring Physician: Dr. Hal Hope Primary Cardiologist: new, previously Dr. Mindi Curtis is an 80 y.o. male.  HPI: [name] is an 80 yo man with PMH of hypertension and CAD with CABG in the 90s who was brought in by his daughter who drove in from [place] because he has been staying in bed. She typically calls at 5 pm each day and when she called yesterday (Tuesday), he was still in bed and he thought it was 5 am. He has a twin sister who checked on him and thought he was just staying in bed. Otherwise, he completely denies chest pain, dyspnea, shortness of breath or any other symptoms. However, per his daughter this is completely atypical of him and he hasn't behaved like this since the 51s when he had a kidney stone. He was noted to have elevated troponin in the ER. He received aspirin and heparin has been ordered.   Past Medical History  Diagnosis Date  . Prostate cancer   . Hypertension   . Myocardial infarction     Past Surgical History  Procedure Laterality Date  . Appendectomy    . Multiple tooth extractions Bilateral   . Coronary artery bypass graft      Family History  Problem Relation Age of Onset  . Diabetes Father   . Deep vein thrombosis Father     Social History:  reports that he has quit smoking. He does not have any smokeless tobacco history on file. He reports that he does not drink alcohol or use illicit drugs.  Allergies: No Known Allergies  Medications:  I have reviewed the patient's current medications. Prior to Admission:  Prescriptions prior to admission  Medication Sig Dispense Refill Last Dose  . losartan (COZAAR) 100 MG tablet Take 100 mg by mouth daily.   06/23/2014 at Unknown time   Scheduled: . [START ON 06/24/2014] aspirin EC  325 mg Oral Daily  . [START ON 06/24/2014] atorvastatin  10 mg Oral q1800  . cefTRIAXone (ROCEPHIN)  IV  1 g Intravenous QHS  . [START ON 06/24/2014] losartan  100 mg Oral Daily  .  metoprolol tartrate  12.5 mg Oral BID  . sodium chloride  3 mL Intravenous Q12H    Results for orders placed or performed during the hospital encounter of 06/23/14 (from the past 48 hour(s))  CBC with Differential     Status: Abnormal   Collection Time: 06/23/14  6:00 PM  Result Value Ref Range   WBC 10.3 4.0 - 10.5 K/uL   RBC 3.86 (L) 4.22 - 5.81 MIL/uL   Hemoglobin 12.4 (L) 13.0 - 17.0 g/dL   HCT 36.4 (L) 39.0 - 52.0 %   MCV 94.3 78.0 - 100.0 fL   MCH 32.1 26.0 - 34.0 pg   MCHC 34.1 30.0 - 36.0 g/dL   RDW 13.1 11.5 - 15.5 %   Platelets 159 150 - 400 K/uL   Neutrophils Relative % 62 43 - 77 %   Neutro Abs 6.5 1.7 - 7.7 K/uL   Lymphocytes Relative 19 12 - 46 %   Lymphs Abs 2.0 0.7 - 4.0 K/uL   Monocytes Relative 17 (H) 3 - 12 %   Monocytes Absolute 1.7 (H) 0.1 - 1.0 K/uL   Eosinophils Relative 2 0 - 5 %   Eosinophils Absolute 0.2 0.0 - 0.7 K/uL   Basophils Relative 0 0 - 1 %   Basophils Absolute 0.0 0.0 - 0.1 K/uL  Comprehensive metabolic  panel     Status: Abnormal   Collection Time: 06/23/14  6:00 PM  Result Value Ref Range   Sodium 139 137 - 147 mEq/L   Potassium 4.0 3.7 - 5.3 mEq/L   Chloride 102 96 - 112 mEq/L   CO2 24 19 - 32 mEq/L   Glucose, Bld 104 (H) 70 - 99 mg/dL   BUN 34 (H) 6 - 23 mg/dL   Creatinine, Ser 1.41 (H) 0.50 - 1.35 mg/dL   Calcium 9.0 8.4 - 10.5 mg/dL   Total Protein 7.0 6.0 - 8.3 g/dL   Albumin 3.2 (L) 3.5 - 5.2 g/dL   AST 42 (H) 0 - 37 U/L   ALT 19 0 - 53 U/L   Alkaline Phosphatase 60 39 - 117 U/L   Total Bilirubin 0.4 0.3 - 1.2 mg/dL   GFR calc non Af Amer 43 (L) >90 mL/min   GFR calc Af Amer 49 (L) >90 mL/min    Comment: (NOTE) The eGFR has been calculated using the CKD EPI equation. This calculation has not been validated in all clinical situations. eGFR's persistently <90 mL/min signify possible Chronic Kidney Disease.    Anion gap 13 5 - 15  Troponin I     Status: Abnormal   Collection Time: 06/23/14  6:00 PM  Result Value Ref Range     Troponin I 6.19 (HH) <0.30 ng/mL    Comment:        Due to the release kinetics of cTnI, a negative result within the first hours of the onset of symptoms does not rule out myocardial infarction with certainty. If myocardial infarction is still suspected, repeat the test at appropriate intervals. CRITICAL RESULT CALLED TO, READ BACK BY AND VERIFIED WITH: Noralyn Pick 1914 06/23/14 WBOND   I-Stat Troponin, ED (not at [facility])     Status: Abnormal   Collection Time: 06/23/14  6:19 PM  Result Value Ref Range   Troponin i, poc 8.58 (HH) 0.00 - 0.08 ng/mL   Comment NOTIFIED PHYSICIAN    Comment 3            Comment: Due to the release kinetics of cTnI, a negative result within the first hours of the onset of symptoms does not rule out myocardial infarction with certainty. If myocardial infarction is still suspected, repeat the test at appropriate intervals.   Urinalysis, Routine w reflex microscopic     Status: Abnormal   Collection Time: 06/23/14  6:48 PM  Result Value Ref Range   Color, Urine AMBER (A) YELLOW    Comment: BIOCHEMICALS MAY BE AFFECTED BY COLOR   APPearance CLEAR CLEAR   Specific Gravity, Urine 1.027 1.005 - 1.030   pH 5.0 5.0 - 8.0   Glucose, UA NEGATIVE NEGATIVE mg/dL   Hgb urine dipstick SMALL (A) NEGATIVE   Bilirubin Urine SMALL (A) NEGATIVE   Ketones, ur 15 (A) NEGATIVE mg/dL   Protein, ur 30 (A) NEGATIVE mg/dL   Urobilinogen, UA 1.0 0.0 - 1.0 mg/dL   Nitrite NEGATIVE NEGATIVE   Leukocytes, UA NEGATIVE NEGATIVE  Urine microscopic-add on     Status: None   Collection Time: 06/23/14  6:48 PM  Result Value Ref Range   Squamous Epithelial / LPF RARE RARE   WBC, UA 0-2 <3 WBC/hpf   RBC / HPF 7-10 <3 RBC/hpf   Bacteria, UA RARE RARE   Urine-Other MUCOUS PRESENT     Ct Head Wo Contrast  06/23/2014   CLINICAL DATA:  Mental status change.  Confusion.  EXAM: CT HEAD WITHOUT CONTRAST  TECHNIQUE: Contiguous axial images were obtained from the base of the skull  through the vertex without intravenous contrast.  COMPARISON:  None.  FINDINGS: Stable age related cerebral atrophy, ventriculomegaly and periventricular white matter disease. No extra-axial fluid collections are identified. No CT findings for acute hemispheric infarction or intracranial hemorrhage. No mass lesions. The brainstem and cerebellum are normal.  The No acute bony findings. The paranasal sinuses and mastoid air cells are clear. The globes are intact.  IMPRESSION: Age related cerebral atrophy, ventriculomegaly periventricular white matter disease. No acute intracranial findings or mass lesions.   Electronically Signed   By: Kalman Jewels M.D.   On: 06/23/2014 19:55    Review of Systems  Constitutional: Positive for malaise/fatigue. Negative for fever, chills and diaphoresis.  HENT: Positive for hearing loss. Negative for ear discharge.   Eyes: Negative for double vision and pain.  Respiratory: Negative for cough and shortness of breath.   Cardiovascular: Negative for chest pain, claudication and leg swelling.  Gastrointestinal: Negative for heartburn, nausea and vomiting.  Genitourinary: Negative for dysuria and hematuria.  Musculoskeletal: Negative for myalgias and neck pain.  Skin: Negative for rash.  Neurological: Negative for dizziness, tingling, tremors and headaches.  Endo/Heme/Allergies: Negative for polydipsia. Does not bruise/bleed easily.  Psychiatric/Behavioral: Negative for depression, suicidal ideas, hallucinations and substance abuse.   Blood pressure 113/60, pulse 55, temperature 98.1 F (36.7 C), temperature source Oral, resp. rate 18, height _0  (1.753 m), weight 76.159 kg (167 lb 14.4 oz), SpO2 99 %. Physical Exam  Nursing note and vitals reviewed. Constitutional: He is oriented to person, place, and time. He appears well-developed and well-nourished. No distress.  HENT:  Head: Normocephalic and atraumatic.  Nose: Nose normal.  Mouth/Throat: Oropharynx is  clear and moist. No oropharyngeal exudate.  Eyes: Conjunctivae and EOM are normal. Pupils are equal, round, and reactive to light. No scleral icterus.  Neck: Normal range of motion. Neck supple.  Cardiovascular: Normal rate, regular rhythm, normal heart sounds and intact distal pulses.  Exam reveals no gallop.   Respiratory: Effort normal and breath sounds normal. No respiratory distress. He has no wheezes. He has no rales.  GI: Soft. Bowel sounds are normal. He exhibits no distension. There is no tenderness. There is no rebound.  Musculoskeletal: Normal range of motion. He exhibits no edema or tenderness.  Neurological: He is alert and oriented to person, place, and time. No cranial nerve deficit. Coordination normal.  Skin: Skin is warm and dry. No rash noted. He is not diaphoretic. No erythema.  Psychiatric: He has a normal mood and affect. His behavior is normal. Thought content normal.  labs reviewed; bun/cr 34/1.41, ast/alt 42/19, Trop I 6.2 CTH - no acute process, some ? Chronic changes EKG: ST depression laterally (ischemia) and inferior infarct, old  Assessment/Plan: Mr. Kristain Hu is an 80 yo man with PMH of hypertension, CAD and previous CABG who presents with daughter with some changes in behavior largely fatigue/weakness and found to have an NSTEMI. He appears much younger than stated age. I discussed potential LHC with patient and family including risks of bleeding, renal failure/dialysis and rarely death. We will gently hydrate tonight. Based on telemetry and symptoms overnight, will determine if LHC plans tomorrow.  - NPO after MN for potential LHC - telemetry, update hba1c, tsh, lipid panel - daily aspirin, heparin gtt now - atorvastatin 80 mg qHS first dose now - low dose metoprolol 12.5 mg bid with  holding parameters - update echocardiogram  - gentle IV fluids 75 ml/hr x 8 hours  Sabian Kuba 06/23/2014, 11:00 PM

## 2014-06-23 NOTE — Progress Notes (Signed)
ANTICOAGULATION CONSULT NOTE - Initial Consult  Pharmacy Consult for heparin Indication: chest pain/ACS  No Known Allergies  Patient Measurements: Height: 5\' 9"  (175.3 cm) Weight: 167 lb 14.4 oz (76.159 kg) IBW/kg (Calculated) : 70.7 Heparin Dosing Weight:   Vital Signs: Temp: 98.1 F (36.7 C) (12/02 2149) Temp Source: Oral (12/02 2149) BP: 113/60 mmHg (12/02 2149) Pulse Rate: 55 (12/02 2149)  Labs:  Recent Labs  06/23/14 1800  HGB 12.4*  HCT 36.4*  PLT 159  CREATININE 1.41*  TROPONINI 6.19*    Estimated Creatinine Clearance: 35.5 mL/min (by C-G formula based on Cr of 1.41).   Medical History: Past Medical History  Diagnosis Date  . Prostate cancer   . Hypertension   . Myocardial infarction     Medications:  Prescriptions prior to admission  Medication Sig Dispense Refill Last Dose  . losartan (COZAAR) 100 MG tablet Take 100 mg by mouth daily.   06/23/2014 at Unknown time    Assessment: 80 yo male with PMH of htn cad and cabg in the 78's. Troponins elevated. Heparin per acs protocol.  Goal of Therapy:  Heparin level 0.3-0.7 units/ml Monitor platelets by anticoagulation protocol: Yes   Plan:  Heparin 3000 unitsx1 then 1000 units/hr. HL in 8 hours.  Daily HL and cbc starting am 12/4   Curlene Dolphin 06/23/2014,11:11 PM

## 2014-06-23 NOTE — ED Notes (Signed)
Troponin 6.57. Dr. Jeneen Rinks informed.

## 2014-06-23 NOTE — ED Notes (Addendum)
Daughter/POA, Juliann Pulse, reports she flew in today rt the conversation she had with her father on Monday. She states that she calls him every day at [facility]. On Monday and Tuesday she reports that her father was very confused and hadn't been out of the bed all day and he believed it was 5 AM. Daughter states that this concerned her since that is so far from baseline. The daughter also states that he seems back to baseline now with the exception of not doing ADL's-showering and shaving.

## 2014-06-23 NOTE — ED Provider Notes (Addendum)
CSN: [id number]     Arrival date & time 06/23/14  1753 History   First MD Initiated Contact with Patient 06/23/14 1801     Chief Complaint  Patient presents with  . Urinary Tract Infection  . Abnormal ECG      HPI  Patient presents for evaluation via EMS from [facility]. Patient lives alone and functions well at home. He has a sister that checks on him intermittently. Monday. Patient slept all day until 5 PM which is highly unusual for the patient. He states that he has felt "confused" off and on since Monday. He felt weak yesterday and was up and around but not active. No chest pain no shortness of breath no dysuria or fever.  Past Medical History  Diagnosis Date  . Prostate cancer   . Hypertension   . Myocardial infarction    Past Surgical History  Procedure Laterality Date  . Appendectomy    . Multiple tooth extractions Bilateral   . Coronary artery bypass graft     Family History  Problem Relation Age of Onset  . Diabetes Father   . Deep vein thrombosis Father    History  Substance Use Topics  . Smoking status: Former Research scientist (life sciences)  . Smokeless tobacco: Not on file  . Alcohol Use: No    Review of Systems  Constitutional: Positive for activity change. Negative for fever, chills, diaphoresis, appetite change and fatigue.  HENT: Negative for mouth sores, sore throat and trouble swallowing.   Eyes: Negative for visual disturbance.  Respiratory: Negative for cough, chest tightness, shortness of breath and wheezing.   Cardiovascular: Negative for chest pain.  Gastrointestinal: Negative for nausea, vomiting, abdominal pain, diarrhea and abdominal distention.  Endocrine: Negative for polydipsia, polyphagia and polyuria.  Genitourinary: Negative for dysuria, frequency and hematuria.  Musculoskeletal: Negative for gait problem.  Skin: Negative for color change, pallor and rash.  Neurological: Positive for weakness. Negative for dizziness, syncope, light-headedness and  headaches.       Confusion  Hematological: Does not bruise/bleed easily.  Psychiatric/Behavioral: Negative for behavioral problems and confusion.      Allergies  Review of patient's allergies indicates no known allergies.  Home Medications   Prior to Admission medications   Medication Sig Start Date End Date Taking? Authorizing Provider  losartan (COZAAR) 100 MG tablet Take 100 mg by mouth daily.   Yes Historical Provider, MD   BP 106/75 mmHg  Pulse 146  Temp(Src) 98.3 F (36.8 C) (Oral)  Resp 17  Ht 5\' 9"  (1.753 m)  Wt 173 lb (78.472 kg)  BMI 25.54 kg/m2  SpO2 98% Physical Exam  Constitutional: No distress.  Generalized weakness.  HENT:  Head: Normocephalic.  Eyes: Conjunctivae are normal. Pupils are equal, round, and reactive to light. No scleral icterus.  Neck: Normal range of motion. Neck supple. No thyromegaly present.  Cardiovascular: Normal rate and regular rhythm.  Exam reveals no gallop and no friction rub.   No murmur heard. Pulmonary/Chest: Effort normal and breath sounds normal. No respiratory distress. He has no wheezes. He has no rales.  Abdominal: Soft. Bowel sounds are normal. He exhibits no distension. There is no tenderness. There is no rebound.  Musculoskeletal: Normal range of motion.  Neurological: He is alert.  No focal deficits or asymmetry. Generalized weakness.  Skin: Skin is warm and dry. No rash noted.  Psychiatric: He has a normal mood and affect. His behavior is normal.    ED Course  Procedures (including critical care time)  Labs Review Labs Reviewed  CBC WITH DIFFERENTIAL - Abnormal; Notable for the following:    RBC 3.86 (*)    Hemoglobin 12.4 (*)    HCT 36.4 (*)    Monocytes Relative 17 (*)    Monocytes Absolute 1.7 (*)    All other components within normal limits  COMPREHENSIVE METABOLIC PANEL - Abnormal; Notable for the following:    Glucose, Bld 104 (*)    BUN 34 (*)    Creatinine, Ser 1.41 (*)    Albumin 3.2 (*)    AST 42  (*)    GFR calc non Af Amer 43 (*)    GFR calc Af Amer 49 (*)    All other components within normal limits  URINALYSIS, ROUTINE W REFLEX MICROSCOPIC - Abnormal; Notable for the following:    Color, Urine AMBER (*)    Hgb urine dipstick SMALL (*)    Bilirubin Urine SMALL (*)    Ketones, ur 15 (*)    Protein, ur 30 (*)    All other components within normal limits  TROPONIN I - Abnormal; Notable for the following:    Troponin I 6.19 (*)    All other components within normal limits  I-STAT TROPOININ, ED - Abnormal; Notable for the following:    Troponin i, poc 8.58 (*)    All other components within normal limits  URINE CULTURE  URINE MICROSCOPIC-ADD ON    Imaging Review Ct Head Wo Contrast  06/23/2014   CLINICAL DATA:  Mental status change.  Confusion.  EXAM: CT HEAD WITHOUT CONTRAST  TECHNIQUE: Contiguous axial images were obtained from the base of the skull through the vertex without intravenous contrast.  COMPARISON:  None.  FINDINGS: Stable age related cerebral atrophy, ventriculomegaly and periventricular white matter disease. No extra-axial fluid collections are identified. No CT findings for acute hemispheric infarction or intracranial hemorrhage. No mass lesions. The brainstem and cerebellum are normal.  The No acute bony findings. The paranasal sinuses and mastoid air cells are clear. The globes are intact.  IMPRESSION: Age related cerebral atrophy, ventriculomegaly periventricular white matter disease. No acute intracranial findings or mass lesions.   Electronically Signed   By: Kalman Jewels M.D.   On: 06/23/2014 19:55     EKG Interpretation   Date/Time:  Wednesday June 23 2014 18:03:35 EST Ventricular Rate:  61 PR Interval:  208 QRS Duration: 101 QT Interval:  427 QTC Calculation: 430 R Axis:   0 Text Interpretation:  Sinus rhythm Inferior infarct, age indeterminate  Abnrm T, consider ischemia, anterolateral lds Confirmed by Jeneen Rinks  MD, [place]  [phone]) on 06/23/2014  9:34:24 PM      MDM   Final diagnoses:  Confusion  Non-ST elevation (NSTEMI) myocardial infarction    CT shows no acute changes. Urine showed some signs of infection. Culture is pending. He remains asymptomatic. Elevation of troponin noted. Abnormal EKG noted with ST depression and T-wave inversions. Discussed with Dr. Ron Parker of cardiology. Patient will be seen in consult. Discussed with hospitalist. Patient to be admitted.    Tanna Furry, MD 06/23/14 2134  Tanna Furry, MD 07/22/14 [phone]

## 2014-06-23 NOTE — ED Notes (Signed)
Pt denies Dizziness, SOB, and chest pain.

## 2014-06-24 ENCOUNTER — Inpatient Hospital Stay (HOSPITAL_COMMUNITY): Payer: Medicare Other

## 2014-06-24 DIAGNOSIS — I9589 Other hypotension: Secondary | ICD-10-CM | POA: Diagnosis present

## 2014-06-24 LAB — LIPID PANEL
CHOL/HDL RATIO: 3.5 ratio
Cholesterol: 147 mg/dL (ref 0–200)
HDL: 42 mg/dL (ref >39)
LDL CALC: 94 mg/dL (ref 0–99)
TRIGLYCERIDES: 57 mg/dL (ref <150)
VLDL: 11 mg/dL (ref 0–40)

## 2014-06-24 LAB — CBC WITH DIFFERENTIAL/PLATELET
BASOS PCT: 0 % (ref 0–1)
Basophils Absolute: 0 10*3/uL (ref 0.0–0.1)
EOS ABS: 0.2 10*3/uL (ref 0.0–0.7)
Eosinophils Relative: 3 % (ref 0–5)
HEMATOCRIT: 33 % — AB (ref 39.0–52.0)
Hemoglobin: 11.3 g/dL — ABNORMAL LOW (ref 13.0–17.0)
LYMPHS ABS: 1.8 10*3/uL (ref 0.7–4.0)
Lymphocytes Relative: 25 % (ref 12–46)
MCH: 32 pg (ref 26.0–34.0)
MCHC: 34.2 g/dL (ref 30.0–36.0)
MCV: 93.5 fL (ref 78.0–100.0)
MONO ABS: 1.1 10*3/uL — AB (ref 0.1–1.0)
MONOS PCT: 16 % — AB (ref 3–12)
NEUTROS PCT: 56 % (ref 43–77)
Neutro Abs: 3.9 10*3/uL (ref 1.7–7.7)
Platelets: 153 10*3/uL (ref 150–400)
RBC: 3.53 MIL/uL — AB (ref 4.22–5.81)
RDW: 13 % (ref 11.5–15.5)
WBC: 6.9 10*3/uL (ref 4.0–10.5)

## 2014-06-24 LAB — GLUCOSE, CAPILLARY
GLUCOSE-CAPILLARY: 108 mg/dL — AB (ref 70–99)
GLUCOSE-CAPILLARY: 133 mg/dL — AB (ref 70–99)
Glucose-Capillary: 104 mg/dL — ABNORMAL HIGH (ref 70–99)
Glucose-Capillary: 104 mg/dL — ABNORMAL HIGH (ref 70–99)
Glucose-Capillary: 110 mg/dL — ABNORMAL HIGH (ref 70–99)
Glucose-Capillary: 95 mg/dL (ref 70–99)

## 2014-06-24 LAB — COMPREHENSIVE METABOLIC PANEL
ALBUMIN: 2.6 g/dL — AB (ref 3.5–5.2)
ALK PHOS: 52 U/L (ref 39–117)
ALT: 15 U/L (ref 0–53)
AST: 28 U/L (ref 0–37)
Anion gap: 10 (ref 5–15)
BUN: 27 mg/dL — ABNORMAL HIGH (ref 6–23)
CALCIUM: 8.6 mg/dL (ref 8.4–10.5)
CO2: 23 mEq/L (ref 19–32)
Chloride: 104 mEq/L (ref 96–112)
Creatinine, Ser: 1.07 mg/dL (ref 0.50–1.35)
GFR calc Af Amer: 69 mL/min — ABNORMAL LOW (ref >90)
GFR calc non Af Amer: 59 mL/min — ABNORMAL LOW (ref >90)
GLUCOSE: 101 mg/dL — AB (ref 70–99)
Potassium: 3.3 mEq/L — ABNORMAL LOW (ref 3.7–5.3)
Sodium: 137 mEq/L (ref 137–147)
TOTAL PROTEIN: 6 g/dL (ref 6.0–8.3)
Total Bilirubin: 0.8 mg/dL (ref 0.3–1.2)

## 2014-06-24 LAB — HEPARIN LEVEL (UNFRACTIONATED)
Heparin Unfractionated: 0.37 IU/mL (ref 0.30–0.70)
Heparin Unfractionated: 0.28 [IU]/mL — ABNORMAL LOW (ref 0.30–0.70)

## 2014-06-24 LAB — TROPONIN I
TROPONIN I: 6.24 ng/mL — AB (ref <0.30)
Troponin I: 4.69 ng/mL (ref <0.30)
Troponin I: 2.91 ng/mL (ref <0.30)
Troponin I: 3.72 ng/mL (ref <0.30)

## 2014-06-24 LAB — CK TOTAL AND CKMB (NOT AT ARMC)
CK, MB: 4.2 ng/mL — AB (ref 0.3–4.0)
RELATIVE INDEX: 4.2 — AB (ref 0.0–2.5)
Total CK: 101 U/L (ref 7–232)

## 2014-06-24 LAB — URINE CULTURE
COLONY COUNT: NO GROWTH
Culture: NO GROWTH

## 2014-06-24 LAB — TSH: TSH: 2.83 u[IU]/mL (ref 0.350–4.500)

## 2014-06-24 LAB — HEMOGLOBIN A1C
HEMOGLOBIN A1C: 5.2 % (ref <5.7)
Mean Plasma Glucose: 103 mg/dL (ref <117)

## 2014-06-24 MED ORDER — HEPARIN BOLUS VIA INFUSION
1000.0000 [IU] | INTRAVENOUS | Status: AC
  Administered 2014-06-24: 1000 [IU] via INTRAVENOUS
  Filled 2014-06-24: qty 1000

## 2014-06-24 MED ORDER — SODIUM CHLORIDE 0.9 % IJ SOLN: 3.0000 mL | INTRAMUSCULAR | Status: DC | PRN

## 2014-06-24 MED ORDER — PERFLUTREN LIPID MICROSPHERE
1.0000 mL | INTRAVENOUS | Status: AC | PRN
  Administered 2014-06-24: 3 mL via INTRAVENOUS
  Filled 2014-06-24: qty 10

## 2014-06-24 MED ORDER — SODIUM CHLORIDE 0.9 % IV SOLN
INTRAVENOUS | Status: DC
  Administered 2014-06-25: 06:00:00 via INTRAVENOUS

## 2014-06-24 MED ORDER — ASPIRIN 81 MG PO CHEW: 81.0000 mg | CHEWABLE_TABLET | ORAL | Status: DC

## 2014-06-24 MED ORDER — SODIUM CHLORIDE 0.9 % IJ SOLN
3.0000 mL | Freq: Two times a day (BID) | INTRAMUSCULAR | Status: DC
  Administered 2014-06-24: 3 mL via INTRAVENOUS

## 2014-06-24 MED ORDER — ENSURE COMPLETE PO LIQD
237.0000 mL | Freq: Every day | ORAL | Status: DC
  Administered 2014-06-27 – 2014-06-28 (×2): 237 mL via ORAL

## 2014-06-24 MED ORDER — HEPARIN (PORCINE) IN NACL 100-0.45 UNIT/ML-% IJ SOLN
1250.0000 [IU]/h | INTRAMUSCULAR | Status: DC
  Administered 2014-06-24: 1250 [IU]/h via INTRAVENOUS
  Filled 2014-06-24 (×2): qty 250

## 2014-06-24 MED ORDER — POTASSIUM CHLORIDE CRYS ER 20 MEQ PO TBCR
40.0000 meq | EXTENDED_RELEASE_TABLET | Freq: Once | ORAL | Status: AC
  Administered 2014-06-24: 40 meq via ORAL
  Filled 2014-06-24: qty 2

## 2014-06-24 MED ORDER — SODIUM CHLORIDE 0.9 % IV SOLN: 250.0000 mL | INTRAVENOUS | Status: DC | PRN

## 2014-06-24 NOTE — Progress Notes (Signed)
Utilization review completed.  

## 2014-06-24 NOTE — Progress Notes (Addendum)
ANTICOAGULATION CONSULT NOTE - Follow up  Pharmacy Consult for heparin Indication: chest pain/ACS  No Known Allergies  Patient Measurements: Height: 5\' 9"  (175.3 cm) Weight: 167 lb 6.4 oz (75.932 kg) IBW/kg (Calculated) : 70.7 Heparin Dosing Weight: 75.9 kg  Vital Signs: Temp: 98.3 F (36.8 C) (12/03 1920) Temp Source: Oral (12/03 1920) BP: 99/47 mmHg (12/03 1920) Pulse Rate: 65 (12/03 1920)  Labs:  Recent Labs  06/23/14 1800  06/24/14 0406 06/24/14 0741 06/24/14 1027 06/24/14 1646 06/24/14 1845  HGB 12.4*  --  11.3*  --   --   --   --   HCT 36.4*  --  33.0*  --   --   --   --   PLT 159  --  153  --   --   --   --   HEPARINUNFRC  --   --   --  0.37  --   --  0.28*  CREATININE 1.41*  --  1.07  --   --   --   --   CKTOTAL  --   --   --   --  101  --   --   CKMB  --   --   --   --  4.2*  --   --   TROPONINI 6.19*  < > 6.24*  --  4.69* 2.91*  --   < > = values in this interval not displayed.  Estimated Creatinine Clearance: 46.8 mL/min (by C-G formula based on Cr of 1.07).   Medical History: Past Medical History  Diagnosis Date  . Prostate cancer   . Hypertension   . Myocardial infarction     Medications:  Prescriptions prior to admission  Medication Sig Dispense Refill Last Dose  . losartan (COZAAR) 100 MG tablet Take 100 mg by mouth daily.   06/23/2014 at Unknown time    Assessment: 8 heparin level decreased to 0.28 on heparin drip 1100 units/hr for ACS in this 80 yo male.  This AM the heparin level was 0.37 on 1000 units/hr of heparin. CBC trending down, but stable.  Platelets within normal.   No bleeding noted.   Goal of Therapy:  Heparin level 0.3-0.7 units/ml Monitor platelets by anticoagulation protocol: Yes   Plan:  Heparin bolus 1000 units x1 and increase heparin rate to 1250 units/hr. HL with 5AM labs. Daily HL and CBC  Nicole Cella, RPh Clinical Pharmacist Pager: [phone] 06/24/2014,8:55 PM

## 2014-06-24 NOTE — Progress Notes (Addendum)
INITIAL NUTRITION ASSESSMENT  DOCUMENTATION CODES Per approved criteria  -Not Applicable   INTERVENTION: Chocolate Ensure Complete po daily, each supplement provides 350 kcal and 13 grams of protein RD to follow for nutrition care plan  NUTRITION DIAGNOSIS: No nutrition diagnosis at this time  Goal: Pt to meet >/= 90% of their estimated nutrition needs   Monitor:  PO & supplemental intake, weight, labs, I/O's  Reason for Assessment: Malnutrition Screening Tool Report  80 y.o. male  Admitting Dx: non-ST elevation MI  ASSESSMENT: 80 year old male with PMH of CAD s/p CABG, hypertension, was brought to the ED by his daughter after he was found to be more confused and had not gotten out of bed for past 2 days as per his daughter.   In the ED patient was confused. A CT angiogram of the chest was negative for PE. Patient's KG showed ST depression and troponin was artery elevated. Patient placed on IV heparin drip and cardiology consulted.  RD spoke with patient's daughter at bedside.  Reports pt's eating well.  PO intake 100% per flowsheet records.  Endorses however pt's has had some progressive weight loss over the past few months.  Unable to quantify amount or time frame.  RD offered oral nutrition supplements.  Pt's daughter would like her Father to receive during hospitalization stating "he probably needs the electrolytes."  No muscle or subcutaneous fat depletion noticed.  Height: Ht Readings from Last 1 Encounters:  06/23/14 5\' 9"  (1.753 m)    Weight: Wt Readings from Last 1 Encounters:  06/24/14 167 lb 6.4 oz (75.932 kg)    Ideal Body Weight: 160 lb  % Ideal Body Weight: 104%  Wt Readings from Last 10 Encounters:  06/24/14 167 lb 6.4 oz (75.932 kg)  06/08/13 180 lb (81.647 kg)    Usual Body Weight: ---  % Usual Body Weight: ---  BMI:  Body mass index is 24.71 kg/(m^2).  Estimated Nutritional Needs: Kcal: 1750-1950 Protein: 85-95 gm Fluid: 1.7-1.9  L  Skin: Intact  Diet Order: Heart Healthy  EDUCATION NEEDS: -No education needs identified at this time   Intake/Output Summary (Last 24 hours) at 06/24/14 1412 Last data filed at 06/24/14 1245  Gross per 24 hour  Intake    123 ml  Output      0 ml  Net    123 ml    Labs:   Recent Labs Lab 06/23/14 1800 06/24/14 0406  NA 139 137  K 4.0 3.3*  CL 102 104  CO2 24 23  BUN 34* 27*  CREATININE 1.41* 1.07  CALCIUM 9.0 8.6  GLUCOSE 104* 101*    CBG (last 3)   Recent Labs  06/24/14 0614 06/24/14 0833 06/24/14 1133  GLUCAP 108* 104* 133*    Scheduled Meds: . aspirin EC  325 mg Oral Daily  . atorvastatin  80 mg Oral q1800  . cefTRIAXone (ROCEPHIN)  IV  1 g Intravenous QHS  . sodium chloride  3 mL Intravenous Q12H  . sodium chloride  3 mL Intravenous Q12H    Continuous Infusions: . sodium chloride 75 mL/hr (06/24/14 0017)  . heparin 1,100 Units/hr (06/24/14 1057)    Past Medical History  Diagnosis Date  . Prostate cancer   . Hypertension   . Myocardial infarction     Past Surgical History  Procedure Laterality Date  . Appendectomy    . Multiple tooth extractions Bilateral   . Coronary artery bypass graft      Arthur Holms, RD, LDN  Pager #: [phone] After-Hours Pager #: [phone]

## 2014-06-24 NOTE — Progress Notes (Signed)
Echocardiogram 2D Echocardiogram with Definity has been performed.  Mahaila Tischer 06/24/2014, 10:58 AM

## 2014-06-24 NOTE — Plan of Care (Signed)
Problem: Phase I Progression Outcomes Goal: Anginal pain relieved Outcome: Not Applicable Date Met:  02/89/02 Goal: Aspirin unless contraindicated Outcome: Completed/Met Date Met:  06/24/14 Goal: Voiding-avoid urinary catheter unless indicated Outcome: Completed/Met Date Met:  06/24/14

## 2014-06-24 NOTE — Progress Notes (Signed)
Patient Name: Henry Curtis Date of Encounter: 06/24/2014  Primary Cardiologist: new, previously Dr. Pauline Aus - none since Dr. Pauline Aus retired   Active Problems:   Non-ST elevation MI (NSTEMI)   Acute renal failure   Acute encephalopathy   Essential hypertension   Non-ST elevation (NSTEMI) myocardial infarction    SUBJECTIVE  Denies any CP or SOB. Did not sleep good due to loud noice. Denies having any CP prior to arrival.  CURRENT MEDS . aspirin EC  325 mg Oral Daily  . atorvastatin  80 mg Oral q1800  . cefTRIAXone (ROCEPHIN)  IV  1 g Intravenous QHS  . losartan  100 mg Oral Daily  . metoprolol tartrate  12.5 mg Oral BID  . potassium chloride SA  40 mEq Oral Once  . sodium chloride  3 mL Intravenous Q12H    OBJECTIVE  Filed Vitals:   06/23/14 2149 06/24/14 0032 06/24/14 0428 06/24/14 0436  BP: 113/60 94/60 84/49  86/50  Pulse: 55 62 52   Temp: 98.1 F (36.7 C)  98.2 F (36.8 C)   TempSrc: Oral  Oral   Resp: 18  19   Height:      Weight: 167 lb 14.4 oz (76.159 kg)  167 lb 6.4 oz (75.932 kg)   SpO2: 99% 98% 96%     Intake/Output Summary (Last 24 hours) at 06/24/14 0803 Last data filed at 06/23/14 2300  Gross per 24 hour  Intake      3 ml  Output      0 ml  Net      3 ml   Filed Weights   06/23/14 1803 06/23/14 2149 06/24/14 0428  Weight: 173 lb (78.472 kg) 167 lb 14.4 oz (76.159 kg) 167 lb 6.4 oz (75.932 kg)    PHYSICAL EXAM  General: Pleasant, NAD. Neuro: Alert and oriented X 3. Moves all extremities spontaneously. Psych: Normal affect. HEENT:  Normal  Neck: Supple without bruits or JVD. Lungs:  Resp regular and unlabored, CTA. Heart: RRR no s3, s4, or murmurs. Abdomen: Soft, non-tender, non-distended, BS + x 4.  Extremities: No clubbing, cyanosis or edema. DP/PT/Radials 2+ and equal bilaterally.  Accessory Clinical Findings  CBC  Recent Labs  06/23/14 1800 06/24/14 0406  WBC 10.3 6.9  NEUTROABS 6.5 3.9  HGB 12.4* 11.3*  HCT 36.4* 33.0*  MCV  94.3 93.5  PLT 159 332   Basic Metabolic Panel  Recent Labs  06/23/14 1800 06/24/14 0406  NA 139 137  K 4.0 3.3*  CL 102 104  CO2 24 23  GLUCOSE 104* 101*  BUN 34* 27*  CREATININE 1.41* 1.07  CALCIUM 9.0 8.6   Liver Function Tests  Recent Labs  06/23/14 1800 06/24/14 0406  AST 42* 28  ALT 19 15  ALKPHOS 60 52  BILITOT 0.4 0.8  PROT 7.0 6.0  ALBUMIN 3.2* 2.6*   Cardiac Enzymes  Recent Labs  06/23/14 1800 06/23/14 2315 06/24/14 0406  TROPONINI 6.19* 6.91* 6.24*   Fasting Lipid Panel  Recent Labs  06/24/14 0500  CHOL 147  HDL 42  LDLCALC 94  TRIG 57  CHOLHDL 3.5   Thyroid Function Tests  Recent Labs  06/24/14 0406  TSH 2.830    TELE NSR with PACs, HR largely 50s, occasional HR in 30s    ECG  NSR with TWI in V4-V6  Echocardiogram  pending    Radiology/Studies  Ct Head Wo Contrast  06/23/2014   CLINICAL DATA:  Mental status change.  Confusion.  EXAM: CT HEAD WITHOUT CONTRAST  TECHNIQUE: Contiguous axial images were obtained from the base of the skull through the vertex without intravenous contrast.  COMPARISON:  None.  FINDINGS: Stable age related cerebral atrophy, ventriculomegaly and periventricular white matter disease. No extra-axial fluid collections are identified. No CT findings for acute hemispheric infarction or intracranial hemorrhage. No mass lesions. The brainstem and cerebellum are normal.  The No acute bony findings. The paranasal sinuses and mastoid air cells are clear. The globes are intact.  IMPRESSION: Age related cerebral atrophy, ventriculomegaly periventricular white matter disease. No acute intracranial findings or mass lesions.   Electronically Signed   By: Kalman Jewels M.D.   On: 06/23/2014 19:55   Dg Chest Port 1 View  06/23/2014   CLINICAL DATA:  80 year old male with acute myocardial infarction. Initial encounter.  EXAM: PORTABLE CHEST - 1 VIEW  COMPARISON:  07/01/2008 chest CT  FINDINGS: Cardiomegaly and CABG  changes noted.  There is no evidence of focal airspace disease, pulmonary edema, suspicious pulmonary nodule/mass, pleural effusion, or pneumothorax. No acute bony abnormalities are identified.  IMPRESSION: Cardiomegaly without evidence of acute cardiopulmonary disease.   Electronically Signed   By: Hassan Rowan M.D.   On: 06/23/2014 23:48    ASSESSMENT AND PLAN  [name] is an 80 yo man with PMH of hypertension, CAD and previous CABG who presents with daughter with some changes in behavior largely fatigue/weakness and found to have an NSTEMI. He appears much younger than stated age. I discussed potential LHC with patient and family including risks of bleeding, renal failure/dialysis and rarely death. We will gently hydrate tonight. Based on telemetry and symptoms overnight, will determine if [facility] plans tomorrow.  1. NSTEMI  - no obvious CP. Came in with AMS, fatigue and weakness  - will discuss with Dr. Stanford Breed, no obvious cause for elevation trop, EKG shows significant TWI in anterior lead despite having no CP, trop significantly elevated at 6. I will tentatively schedule him for cardiac cath today.  2. CAD s/p CABG in 1990  - no prior record of graft location even though per pt, CABG was done at [facility]  - follow by Dr. Pauline Aus before he retired. No cardiology followup in recent yrs  - per daughter, prior to his CABG, he had a really bad "indigestion" feeling, although lack the same sx this time  - also per daughter, he may had 1 stent before  3. HTN - now hypotensive  - will hold losartan and metoprolol   4. Former smoker 5. Asymptomatic bradycardia: plan to hold morning BP med  Signed, Almyra Deforest PA-C Pager: [phone]  As above, patient seen and examined. Briefly he is an 80 year old male with a past medical history of coronary artery disease status post prior coronary artery bypass graft who was admitted with confusion and weakness. This has been ongoing for approximately 4 days. He  apparently has not been getting out of bed. He has not had chest pain or dyspnea. Cardiac markers were checked and his troponin has been elevated. He also is noted to have mild anemia and was dehydrated on admission. Apparently outside urinalysis suggested urinary tract infection but repeat here less convincing. I am not completely clear about etiology of presentation. He is having absolutely no cardiac symptoms. I will continue with aspirin, heparin and statin. His blood pressure is borderline. Hold ARB and beta blocker. Check echocardiogram. Hydrate. Given that he has not moved significantly in the past 4 days I wonder if  there may be a component of rhabdomyolysis. Check total CK and CK-MB. Continue to cycle troponins. We will plan cardiac catheterization tomorrow assuming above evaluation unrevealing. Henry Curtis

## 2014-06-24 NOTE — Progress Notes (Signed)
Pt HR dropped to 38 but continues to stay in the 40's-50's. Pt is asleep. Paged M.D to notify. No new orders given. Will continue to monitor.

## 2014-06-24 NOTE — Progress Notes (Signed)
[facility] PROGRESS NOTE  Henry Curtis SWH:[id number] DOB: [birth year] DOA: 06/23/2014 PCP: Tommy Medal, MD   Brief narrative 80 year old male with history of coronary artery disease status post CABG, hypertension, was brought to the ED by his daughter after he was found to be more confused and had not gotten out of bed for past 2 days as per his daughter. His daughter lives in [place] and contacts patient daily. At baseline patient lives alone and is fairly independent. 2-D prior to admission patient's daughter did not hear back from him. When she called him the next day he seemed confused. She was concerned that he was not feeling well and drove here. Patient was taken to PCP office where UA was suggestive of a UTI and EKG showed ST depression and he was referred to the ED. In the ED patient was confused. A CT angiogram of the chest was negative for PE. Patient's KG showed ST depression and troponin was artery elevated. Patient placed on IV heparin drip and cardiology consulted.  Assessment/Plan: NSTEMI Based on ST depression changes on EKG and progressively elevated troponins. Patient placed on full dose aspirin, heparin and statin. He'll do beta blocker due to low blood pressure. Vision having progressively elevated troponin peaking at 6.91. Symptoms wise he does not have any chest discomfort or shortness of breath. The patient's total CPK is normal with CK-MB of 4.2. LDL of 94. Daughter reports patient has not been intolerant to statin due to muscle aches. -Continue gentle hydration. Check 2-D echo. Stable on telemetry. -Plan on cardiac cath tomorrow.   ?UTI On empiric Rocephin. Check urine culture   Acute encephalopathy Besides possible UTI , no clear cause for encephalopathy. Could be secondary to NSTEMI. Mental status appears to be clearing.  Mild acute kidney injury Monitor with IV hydration. Holding ARB due to low blood pressure  Hypokalemia Replenish  DVT  prophylaxis: IV heparin Diet: Heart healthy, nothing by mouth after midnight  Code Status: Full code Family Communication: Daughter at bedside Disposition Plan:inpatient. Obtain PT after cardiac cath is improved. May need home health versus SNF  Consultants:  Cardiology  Procedures:  2-D echo  Cardiac cath on 12/4  Antibiotics:  Rocephin  HPI/Subjective: Patient seen and examined. Admission H&P reviewed. Denies any chest pain or shortness of breath. Denies any weakness or fatigue  Objective: Filed Vitals:   06/24/14 0436  BP: 86/50  Pulse:   Temp:   Resp:     Intake/Output Summary (Last 24 hours) at 06/24/14 1232 Last data filed at 06/23/14 2300  Gross per 24 hour  Intake      3 ml  Output      0 ml  Net      3 ml   Filed Weights   06/23/14 1803 06/23/14 2149 06/24/14 0428  Weight: 78.472 kg (173 lb) 76.159 kg (167 lb 14.4 oz) 75.932 kg (167 lb 6.4 oz)    Exam:   General: Elderly male lying in bed in no acute distress  HEENT: No pallor, moist oral mucosa  Chest: Clear to auscultation bilaterally  CVS: Normal S1 and S2, no murmurs  Abdomen: Soft, nondistended, nontender, bowel sounds present  Next images: Warm, no edema   CNS: Alert and oriented    Data Reviewed: Basic Metabolic Panel:  Recent Labs Lab 06/23/14 1800 06/24/14 0406  NA 139 137  K 4.0 3.3*  CL 102 104  CO2 24 23  GLUCOSE 104* 101*  BUN 34* 27*  CREATININE 1.41* 1.07  CALCIUM 9.0 8.6   Liver Function Tests:  Recent Labs Lab 06/23/14 1800 06/24/14 0406  AST 42* 28  ALT 19 15  ALKPHOS 60 52  BILITOT 0.4 0.8  PROT 7.0 6.0  ALBUMIN 3.2* 2.6*   No results for input(s): LIPASE, AMYLASE in the last 168 hours. No results for input(s): AMMONIA in the last 168 hours. CBC:  Recent Labs Lab 06/23/14 1800 06/24/14 0406  WBC 10.3 6.9  NEUTROABS 6.5 3.9  HGB 12.4* 11.3*  HCT 36.4* 33.0*  MCV 94.3 93.5  PLT 159 153   Cardiac Enzymes:  Recent Labs Lab  06/23/14 1800 06/23/14 2315 06/24/14 0406 06/24/14 1027  CKTOTAL  --   --   --  101  CKMB  --   --   --  4.2*  TROPONINI 6.19* 6.91* 6.24* 4.69*   BNP (last 3 results) No results for input(s): PROBNP in the last 8760 hours. CBG:  Recent Labs Lab 06/24/14 0013 06/24/14 0614 06/24/14 0833 06/24/14 1133  GLUCAP 104* 108* 104* 133*    No results found for this or any previous visit (from the past 240 hour(s)).   Studies: Ct Head Wo Contrast  06/23/2014   CLINICAL DATA:  Mental status change.  Confusion.  EXAM: CT HEAD WITHOUT CONTRAST  TECHNIQUE: Contiguous axial images were obtained from the base of the skull through the vertex without intravenous contrast.  COMPARISON:  None.  FINDINGS: Stable age related cerebral atrophy, ventriculomegaly and periventricular white matter disease. No extra-axial fluid collections are identified. No CT findings for acute hemispheric infarction or intracranial hemorrhage. No mass lesions. The brainstem and cerebellum are normal.  The No acute bony findings. The paranasal sinuses and mastoid air cells are clear. The globes are intact.  IMPRESSION: Age related cerebral atrophy, ventriculomegaly periventricular white matter disease. No acute intracranial findings or mass lesions.   Electronically Signed   By: Kalman Jewels M.D.   On: 06/23/2014 19:55   Dg Chest Port 1 View  06/23/2014   CLINICAL DATA:  80 year old male with acute myocardial infarction. Initial encounter.  EXAM: PORTABLE CHEST - 1 VIEW  COMPARISON:  07/01/2008 chest CT  FINDINGS: Cardiomegaly and CABG changes noted.  There is no evidence of focal airspace disease, pulmonary edema, suspicious pulmonary nodule/mass, pleural effusion, or pneumothorax. No acute bony abnormalities are identified.  IMPRESSION: Cardiomegaly without evidence of acute cardiopulmonary disease.   Electronically Signed   By: Hassan Rowan M.D.   On: 06/23/2014 23:48    Scheduled Meds: . aspirin EC  325 mg Oral Daily  .  atorvastatin  80 mg Oral q1800  . cefTRIAXone (ROCEPHIN)  IV  1 g Intravenous QHS  . sodium chloride  3 mL Intravenous Q12H  . sodium chloride  3 mL Intravenous Q12H   Continuous Infusions: . sodium chloride 75 mL/hr (06/24/14 0017)  . heparin 1,100 Units/hr (06/24/14 1057)      Time spent: [place], [place]  [facility] Pager [phone] If 7PM-7AM, please contact night-coverage at [email or web address], password [facility] 06/24/2014, 12:32 PM  LOS: 1 day

## 2014-06-25 ENCOUNTER — Encounter (HOSPITAL_COMMUNITY): Payer: Self-pay | Admitting: Cardiology

## 2014-06-25 ENCOUNTER — Encounter (HOSPITAL_COMMUNITY)
Admission: EM | Disposition: A | Discharge status: Skilled Nursing Facility | Payer: Medicare Other | Source: Home / Self Care | Attending: Internal Medicine

## 2014-06-25 DIAGNOSIS — I214 Non-ST elevation (NSTEMI) myocardial infarction: Principal | ICD-10-CM

## 2014-06-25 DIAGNOSIS — I25710 Atherosclerosis of autologous vein coronary artery bypass graft(s) with unstable angina pectoris: Secondary | ICD-10-CM

## 2014-06-25 DIAGNOSIS — R338 Other retention of urine: Secondary | ICD-10-CM

## 2014-06-25 DIAGNOSIS — I25110 Atherosclerotic heart disease of native coronary artery with unstable angina pectoris: Secondary | ICD-10-CM

## 2014-06-25 HISTORY — PX: LEFT HEART CATHETERIZATION WITH CORONARY/GRAFT ANGIOGRAM: SHX5450

## 2014-06-25 LAB — BASIC METABOLIC PANEL
ANION GAP: 13 (ref 5–15)
BUN: 22 mg/dL (ref 6–23)
CO2: 20 meq/L (ref 19–32)
CREATININE: 1.07 mg/dL (ref 0.50–1.35)
Calcium: 8.3 mg/dL — ABNORMAL LOW (ref 8.4–10.5)
Chloride: 102 mEq/L (ref 96–112)
GFR calc Af Amer: 69 mL/min — ABNORMAL LOW (ref >90)
GFR calc non Af Amer: 59 mL/min — ABNORMAL LOW (ref >90)
Glucose, Bld: 110 mg/dL — ABNORMAL HIGH (ref 70–99)
Potassium: 3.9 mEq/L (ref 3.7–5.3)
Sodium: 135 mEq/L — ABNORMAL LOW (ref 137–147)

## 2014-06-25 LAB — CBC
HCT: 33.2 % — ABNORMAL LOW (ref 39.0–52.0)
Hemoglobin: 11.5 g/dL — ABNORMAL LOW (ref 13.0–17.0)
MCH: 32.4 pg (ref 26.0–34.0)
MCHC: 34.6 g/dL (ref 30.0–36.0)
MCV: 93.5 fL (ref 78.0–100.0)
PLATELETS: 168 10*3/uL (ref 150–400)
RBC: 3.55 MIL/uL — AB (ref 4.22–5.81)
RDW: 12.8 % (ref 11.5–15.5)
WBC: 7.6 10*3/uL (ref 4.0–10.5)

## 2014-06-25 LAB — GLUCOSE, CAPILLARY
GLUCOSE-CAPILLARY: 104 mg/dL — AB (ref 70–99)
Glucose-Capillary: 110 mg/dL — ABNORMAL HIGH (ref 70–99)
Glucose-Capillary: 120 mg/dL — ABNORMAL HIGH (ref 70–99)

## 2014-06-25 LAB — HEPARIN LEVEL (UNFRACTIONATED): Heparin Unfractionated: 0.55 IU/mL (ref 0.30–0.70)

## 2014-06-25 LAB — PROTIME-INR
INR: 1.24 (ref 0.00–1.49)
Prothrombin Time: 15.7 seconds — ABNORMAL HIGH (ref 11.6–15.2)

## 2014-06-25 SURGERY — LEFT HEART CATHETERIZATION WITH CORONARY/GRAFT ANGIOGRAM
Anesthesia: LOCAL

## 2014-06-25 MED ORDER — SODIUM CHLORIDE 0.9 % IV SOLN
0.2500 mg/kg/h | INTRAVENOUS | Status: DC
  Filled 2014-06-25 (×2): qty 250

## 2014-06-25 MED ORDER — MIDAZOLAM HCL 2 MG/2ML IJ SOLN
0.5000 mg | Freq: Once | INTRAMUSCULAR | Status: AC
  Administered 2014-06-25: 0.5 mg via INTRAVENOUS

## 2014-06-25 MED ORDER — HALOPERIDOL LACTATE 5 MG/ML IJ SOLN
1.0000 mg | Freq: Once | INTRAMUSCULAR | Status: AC
  Administered 2014-06-25: 1 mg via INTRAVENOUS

## 2014-06-25 MED ORDER — TICAGRELOR 90 MG PO TABS
ORAL_TABLET | ORAL | Status: AC
  Filled 2014-06-25: qty 2

## 2014-06-25 MED ORDER — CLOPIDOGREL BISULFATE 75 MG PO TABS
75.0000 mg | ORAL_TABLET | Freq: Every day | ORAL | Status: DC
  Administered 2014-06-26 – 2014-06-29 (×4): 75 mg via ORAL
  Filled 2014-06-25 (×5): qty 1

## 2014-06-25 MED ORDER — SODIUM CHLORIDE 0.9 % IV SOLN: 250.0000 mL | INTRAVENOUS | Status: DC | PRN

## 2014-06-25 MED ORDER — HALOPERIDOL LACTATE 5 MG/ML IJ SOLN
1.0000 mg | Freq: Once | INTRAMUSCULAR | Status: AC
  Administered 2014-06-25: 1 mg via INTRAVENOUS
  Filled 2014-06-25: qty 0.2

## 2014-06-25 MED ORDER — FUROSEMIDE 10 MG/ML IJ SOLN
INTRAMUSCULAR | Status: AC
  Filled 2014-06-25: qty 4

## 2014-06-25 MED ORDER — HEPARIN SODIUM (PORCINE) 5000 UNIT/ML IJ SOLN
5000.0000 [IU] | Freq: Three times a day (TID) | INTRAMUSCULAR | Status: DC
  Administered 2014-06-26 – 2014-06-28 (×9): 5000 [IU] via SUBCUTANEOUS
  Filled 2014-06-25 (×10): qty 1

## 2014-06-25 MED ORDER — MIDAZOLAM HCL 10 MG/2ML IJ SOLN
0.5000 mg | Freq: Once | INTRAMUSCULAR | Status: AC
  Administered 2014-06-25: 0.5 mg via INTRAVENOUS
  Filled 2014-06-25: qty 0.1

## 2014-06-25 MED ORDER — MORPHINE SULFATE 2 MG/ML IJ SOLN
INTRAMUSCULAR | Status: AC
Start: 2014-06-25 — End: 2014-06-25
  Filled 2014-06-25: qty 1

## 2014-06-25 MED ORDER — FENTANYL CITRATE 0.05 MG/ML IJ SOLN
INTRAMUSCULAR | Status: AC
  Filled 2014-06-25: qty 2

## 2014-06-25 MED ORDER — FUROSEMIDE 10 MG/ML IJ SOLN
INTRAMUSCULAR | Status: AC
  Filled 2014-06-25: qty 2

## 2014-06-25 MED ORDER — MIDAZOLAM HCL 2 MG/2ML IJ SOLN
INTRAMUSCULAR | Status: AC
  Filled 2014-06-25: qty 2

## 2014-06-25 MED ORDER — SODIUM CHLORIDE 0.9 % IJ SOLN: 3.0000 mL | INTRAMUSCULAR | Status: DC | PRN

## 2014-06-25 MED ORDER — FUROSEMIDE 10 MG/ML IJ SOLN
20.0000 mg | Freq: Once | INTRAMUSCULAR | Status: AC
  Administered 2014-06-25: 20 mg via INTRAVENOUS

## 2014-06-25 MED ORDER — SODIUM CHLORIDE 0.9 % IJ SOLN
3.0000 mL | Freq: Two times a day (BID) | INTRAMUSCULAR | Status: DC
  Administered 2014-06-25 – 2014-06-29 (×7): 3 mL via INTRAVENOUS

## 2014-06-25 MED ORDER — LIDOCAINE HCL (PF) 1 % IJ SOLN
INTRAMUSCULAR | Status: AC
Start: 2014-06-25 — End: 2014-06-25
  Filled 2014-06-25: qty 30

## 2014-06-25 MED ORDER — NITROGLYCERIN 0.4 MG/SPRAY TL SOLN
1.0000 | Freq: Once | Status: AC
  Administered 2014-06-25: 1 via SUBLINGUAL

## 2014-06-25 MED ORDER — LORAZEPAM 2 MG/ML IJ SOLN
0.5000 mg | INTRAMUSCULAR | Status: DC | PRN
  Administered 2014-06-25 – 2014-06-26 (×2): 1 mg via INTRAVENOUS

## 2014-06-25 MED ORDER — BIVALIRUDIN 250 MG IV SOLR
INTRAVENOUS | Status: AC
  Filled 2014-06-25: qty 250

## 2014-06-25 MED ORDER — HEPARIN (PORCINE) IN NACL 2-0.9 UNIT/ML-% IJ SOLN
INTRAMUSCULAR | Status: AC
  Filled 2014-06-25: qty 1500

## 2014-06-25 MED ORDER — HEPARIN SODIUM (PORCINE) 1000 UNIT/ML IJ SOLN
INTRAMUSCULAR | Status: AC
  Filled 2014-06-25: qty 1

## 2014-06-25 MED ORDER — CLOPIDOGREL BISULFATE 300 MG PO TABS
ORAL_TABLET | ORAL | Status: AC
  Filled 2014-06-25: qty 2

## 2014-06-25 MED ORDER — VERAPAMIL HCL 2.5 MG/ML IV SOLN
INTRAVENOUS | Status: AC
  Filled 2014-06-25: qty 2

## 2014-06-25 MED ORDER — MIDAZOLAM HCL 2 MG/2ML IJ SOLN
1.0000 mg | Freq: Once | INTRAMUSCULAR | Status: AC
  Administered 2014-06-25: 1 mg via INTRAVENOUS

## 2014-06-25 MED ORDER — NITROGLYCERIN 0.4 MG/SPRAY TL SOLN
Status: AC
  Filled 2014-06-25: qty 4.9

## 2014-06-25 MED ORDER — NITROGLYCERIN 1 MG/10 ML FOR IR/CATH LAB
INTRA_ARTERIAL | Status: AC
  Filled 2014-06-25: qty 10

## 2014-06-25 MED ORDER — LORAZEPAM 2 MG/ML IJ SOLN
INTRAMUSCULAR | Status: AC
  Filled 2014-06-25: qty 1

## 2014-06-25 MED ORDER — HALOPERIDOL LACTATE 5 MG/ML IJ SOLN: 1.0000 mg | Freq: Three times a day (TID) | INTRAMUSCULAR | Status: DC | PRN

## 2014-06-25 MED ORDER — FUROSEMIDE 20 MG PO TABS
20.0000 mg | ORAL_TABLET | Freq: Two times a day (BID) | ORAL | Status: DC
  Administered 2014-06-26 – 2014-06-29 (×7): 20 mg via ORAL
  Filled 2014-06-25 (×10): qty 1

## 2014-06-25 MED ORDER — MIDAZOLAM HCL 5 MG/ML IJ SOLN
1.0000 mg | Freq: Once | INTRAMUSCULAR | Status: AC
  Administered 2014-06-25: 1 mg via INTRAVENOUS

## 2014-06-25 MED ORDER — SODIUM CHLORIDE 0.9 % IV SOLN: INTRAVENOUS | Status: DC

## 2014-06-25 NOTE — Consult Note (Signed)
Urology Consult  Referring physician: Dhungel, N Reason for referral: Retention  Chief Complaint: Retention  History of Present Illness: Elderly male with past history of ca of prostate and radiation; today cardiac procedure; post op could not void; CIC x 2 for 900 and 600 and still almost combative/agitated; could not insert a foley Modifying factors: There are no other modifying factors  Associated signs and symptoms: There are no other associated signs and symptoms Aggravating and relieving factors: There are no other aggravating or relieving factors Severity: Moderate Duration: Persistent  Past Medical History  Diagnosis Date  . Prostate cancer   . Hypertension   . Myocardial infarction   . Coronary artery disease involving native coronary artery 12/1989    Unstable Angina  . S/P CABG x 3 12/27/1989    LIMA-D1-LAD, SVG-rPDA (PDA Endarterectomy & patch andioplasty)    Past Surgical History  Procedure Laterality Date  . Appendectomy    . Multiple tooth extractions Bilateral   . Coronary artery bypass graft      Medications: I have reviewed the patient's current medications. Allergies: No Known Allergies  Family History  Problem Relation Age of Onset  . Diabetes Father   . Deep vein thrombosis Father    Social History:  reports that he has quit smoking. He does not have any smokeless tobacco history on file. He reports that he does not drink alcohol or use illicit drugs.  ROS: All systems are reviewed and negative except as noted. Rest negative  Physical Exam:  Vital signs in last 24 hours: Temp:  [98.3 F (36.8 C)] 98.3 F (36.8 C) (12/03 1920) Pulse Rate:  [43-149] 80 (12/04 1532) Resp:  [19-37] 30 (12/04 1532) BP: (99-174)/(47-124) 129/77 mmHg (12/04 1532) SpO2:  [91 %-100 %] 97 % (12/04 1532)  Cardiovascular: Skin warm; not flushed Respiratory: Breaths quiet; no shortness of breath Abdomen: No masses Neurological: Normal sensation to touch Musculoskeletal:  Normal motor function arms and legs Lymphatics: No inguinal adenopathy Skin: No rashes Genitourinary:blood at meatus; not over distended  Laboratory Data:  Results for orders placed or performed during the hospital encounter of 06/23/14 (from the past 72 hour(s))  CBC with Differential     Status: Abnormal   Collection Time: 06/23/14  6:00 PM  Result Value Ref Range   WBC 10.3 4.0 - 10.5 K/uL   RBC 3.86 (L) 4.22 - 5.81 MIL/uL   Hemoglobin 12.4 (L) 13.0 - 17.0 g/dL   HCT 36.4 (L) 39.0 - 52.0 %   MCV 94.3 78.0 - 100.0 fL   MCH 32.1 26.0 - 34.0 pg   MCHC 34.1 30.0 - 36.0 g/dL   RDW 13.1 11.5 - 15.5 %   Platelets 159 150 - 400 K/uL   Neutrophils Relative % 62 43 - 77 %   Neutro Abs 6.5 1.7 - 7.7 K/uL   Lymphocytes Relative 19 12 - 46 %   Lymphs Abs 2.0 0.7 - 4.0 K/uL   Monocytes Relative 17 (H) 3 - 12 %   Monocytes Absolute 1.7 (H) 0.1 - 1.0 K/uL   Eosinophils Relative 2 0 - 5 %   Eosinophils Absolute 0.2 0.0 - 0.7 K/uL   Basophils Relative 0 0 - 1 %   Basophils Absolute 0.0 0.0 - 0.1 K/uL  Comprehensive metabolic panel     Status: Abnormal   Collection Time: 06/23/14  6:00 PM  Result Value Ref Range   Sodium 139 137 - 147 mEq/L   Potassium 4.0 3.7 - 5.3 mEq/L  Chloride 102 96 - 112 mEq/L   CO2 24 19 - 32 mEq/L   Glucose, Bld 104 (H) 70 - 99 mg/dL   BUN 34 (H) 6 - 23 mg/dL   Creatinine, Ser 1.41 (H) 0.50 - 1.35 mg/dL   Calcium 9.0 8.4 - 10.5 mg/dL   Total Protein 7.0 6.0 - 8.3 g/dL   Albumin 3.2 (L) 3.5 - 5.2 g/dL   AST 42 (H) 0 - 37 U/L   ALT 19 0 - 53 U/L   Alkaline Phosphatase 60 39 - 117 U/L   Total Bilirubin 0.4 0.3 - 1.2 mg/dL   GFR calc non Af Amer 43 (L) >90 mL/min   GFR calc Af Amer 49 (L) >90 mL/min    Comment: (NOTE) The eGFR has been calculated using the CKD EPI equation. This calculation has not been validated in all clinical situations. eGFR's persistently <90 mL/min signify possible Chronic Kidney Disease.    Anion gap 13 5 - 15  Troponin I      Status: Abnormal   Collection Time: 06/23/14  6:00 PM  Result Value Ref Range   Troponin I 6.19 (HH) <0.30 ng/mL    Comment:        Due to the release kinetics of cTnI, a negative result within the first hours of the onset of symptoms does not rule out myocardial infarction with certainty. If myocardial infarction is still suspected, repeat the test at appropriate intervals. CRITICAL RESULT CALLED TO, READ BACK BY AND VERIFIED WITH: Noralyn Pick 1914 06/23/14 WBOND   I-Stat Troponin, ED (not at [facility])     Status: Abnormal   Collection Time: 06/23/14  6:19 PM  Result Value Ref Range   Troponin i, poc 8.58 (HH) 0.00 - 0.08 ng/mL   Comment NOTIFIED PHYSICIAN    Comment 3            Comment: Due to the release kinetics of cTnI, a negative result within the first hours of the onset of symptoms does not rule out myocardial infarction with certainty. If myocardial infarction is still suspected, repeat the test at appropriate intervals.   Urinalysis, Routine w reflex microscopic     Status: Abnormal   Collection Time: 06/23/14  6:48 PM  Result Value Ref Range   Color, Urine AMBER (A) YELLOW    Comment: BIOCHEMICALS MAY BE AFFECTED BY COLOR   APPearance CLEAR CLEAR   Specific Gravity, Urine 1.027 1.005 - 1.030   pH 5.0 5.0 - 8.0   Glucose, UA NEGATIVE NEGATIVE mg/dL   Hgb urine dipstick SMALL (A) NEGATIVE   Bilirubin Urine SMALL (A) NEGATIVE   Ketones, ur 15 (A) NEGATIVE mg/dL   Protein, ur 30 (A) NEGATIVE mg/dL   Urobilinogen, UA 1.0 0.0 - 1.0 mg/dL   Nitrite NEGATIVE NEGATIVE   Leukocytes, UA NEGATIVE NEGATIVE  Urine culture     Status: None   Collection Time: 06/23/14  6:48 PM  Result Value Ref Range   Specimen Description URINE, CLEAN CATCH    Special Requests NONE    Culture  Setup Time      06/24/2014 01:10 Performed at [place] Performed at [facility]     Culture NO GROWTH Performed at [facility]     Report  Status 06/24/2014 FINAL   Urine microscopic-add on     Status: None   Collection Time: 06/23/14  6:48 PM  Result Value Ref Range   Squamous Epithelial /  LPF RARE RARE   WBC, UA 0-2 <3 WBC/hpf   RBC / HPF 7-10 <3 RBC/hpf   Bacteria, UA RARE RARE   Urine-Other MUCOUS PRESENT   Troponin I (q 6hr x 3)     Status: Abnormal   Collection Time: 06/23/14 11:15 PM  Result Value Ref Range   Troponin I 6.91 (HH) <0.30 ng/mL    Comment:        Due to the release kinetics of cTnI, a negative result within the first hours of the onset of symptoms does not rule out myocardial infarction with certainty. If myocardial infarction is still suspected, repeat the test at appropriate intervals. CRITICAL VALUE NOTED.  VALUE IS CONSISTENT WITH PREVIOUSLY REPORTED AND CALLED VALUE.   Glucose, capillary     Status: Abnormal   Collection Time: 06/24/14 12:13 AM  Result Value Ref Range   Glucose-Capillary 104 (H) 70 - 99 mg/dL   Comment 1 Documented in Chart    Comment 2 Notify RN   Troponin I (q 6hr x 3)     Status: Abnormal   Collection Time: 06/24/14  4:06 AM  Result Value Ref Range   Troponin I 6.24 (HH) <0.30 ng/mL    Comment:        Due to the release kinetics of cTnI, a negative result within the first hours of the onset of symptoms does not rule out myocardial infarction with certainty. If myocardial infarction is still suspected, repeat the test at appropriate intervals. CRITICAL VALUE NOTED.  VALUE IS CONSISTENT WITH PREVIOUSLY REPORTED AND CALLED VALUE.   Comprehensive metabolic panel     Status: Abnormal   Collection Time: 06/24/14  4:06 AM  Result Value Ref Range   Sodium 137 137 - 147 mEq/L   Potassium 3.3 (L) 3.7 - 5.3 mEq/L    Comment: DELTA CHECK NOTED   Chloride 104 96 - 112 mEq/L   CO2 23 19 - 32 mEq/L   Glucose, Bld 101 (H) 70 - 99 mg/dL   BUN 27 (H) 6 - 23 mg/dL   Creatinine, Ser 1.07 0.50 - 1.35 mg/dL   Calcium 8.6 8.4 - 10.5 mg/dL   Total Protein 6.0 6.0 - 8.3 g/dL    Albumin 2.6 (L) 3.5 - 5.2 g/dL   AST 28 0 - 37 U/L   ALT 15 0 - 53 U/L   Alkaline Phosphatase 52 39 - 117 U/L   Total Bilirubin 0.8 0.3 - 1.2 mg/dL   GFR calc non Af Amer 59 (L) >90 mL/min   GFR calc Af Amer 69 (L) >90 mL/min    Comment: (NOTE) The eGFR has been calculated using the CKD EPI equation. This calculation has not been validated in all clinical situations. eGFR's persistently <90 mL/min signify possible Chronic Kidney Disease.    Anion gap 10 5 - 15  CBC with Differential     Status: Abnormal   Collection Time: 06/24/14  4:06 AM  Result Value Ref Range   WBC 6.9 4.0 - 10.5 K/uL   RBC 3.53 (L) 4.22 - 5.81 MIL/uL   Hemoglobin 11.3 (L) 13.0 - 17.0 g/dL   HCT 33.0 (L) 39.0 - 52.0 %   MCV 93.5 78.0 - 100.0 fL   MCH 32.0 26.0 - 34.0 pg   MCHC 34.2 30.0 - 36.0 g/dL   RDW 13.0 11.5 - 15.5 %   Platelets 153 150 - 400 K/uL   Neutrophils Relative % 56 43 - 77 %   Neutro Abs 3.9 1.7 -  7.7 K/uL   Lymphocytes Relative 25 12 - 46 %   Lymphs Abs 1.8 0.7 - 4.0 K/uL   Monocytes Relative 16 (H) 3 - 12 %   Monocytes Absolute 1.1 (H) 0.1 - 1.0 K/uL   Eosinophils Relative 3 0 - 5 %   Eosinophils Absolute 0.2 0.0 - 0.7 K/uL   Basophils Relative 0 0 - 1 %   Basophils Absolute 0.0 0.0 - 0.1 K/uL  TSH     Status: None   Collection Time: 06/24/14  4:06 AM  Result Value Ref Range   TSH 2.830 0.350 - 4.500 uIU/mL  Hemoglobin A1c     Status: None   Collection Time: 06/24/14  4:06 AM  Result Value Ref Range   Hgb A1c MFr Bld 5.2 <5.7 %    Comment: (NOTE)                                                                       According to the ADA Clinical Practice Recommendations for 2011, when HbA1c is used as a screening test:  >=6.5%   Diagnostic of Diabetes Mellitus           (if abnormal result is confirmed) 5.7-6.4%   Increased risk of developing Diabetes Mellitus References:Diagnosis and Classification of Diabetes Mellitus,Diabetes JTTS,1779,39(QZESP 1):S62-S69 and Standards of  Medical Care in         Diabetes - 2011,Diabetes Care,2011,34 (Suppl 1):S11-S61.    Mean Plasma Glucose 103 <117 mg/dL    Comment: Performed at [facility]  Lipid panel     Status: None   Collection Time: 06/24/14  5:00 AM  Result Value Ref Range   Cholesterol 147 0 - 200 mg/dL   Triglycerides 57 <150 mg/dL   HDL 42 >39 mg/dL   Total CHOL/HDL Ratio 3.5 RATIO   VLDL 11 0 - 40 mg/dL   LDL Cholesterol 94 0 - 99 mg/dL    Comment:        Total Cholesterol/HDL:CHD Risk Coronary Heart Disease Risk Table                     Men   Women  1/2 Average Risk   3.4   3.3  Average Risk       5.0   4.4  2 X Average Risk   9.6   7.1  3 X Average Risk  23.4   11.0        Use the calculated Patient Ratio above and the CHD Risk Table to determine the patient's CHD Risk.        ATP III CLASSIFICATION (LDL):  <100     mg/dL   Optimal  100-129  mg/dL   Near or Above                    Optimal  130-159  mg/dL   Borderline  160-189  mg/dL   High  >190     mg/dL   Very High   Glucose, capillary     Status: Abnormal   Collection Time: 06/24/14  6:14 AM  Result Value Ref Range   Glucose-Capillary 108 (H) 70 - 99 mg/dL   Comment 1 Documented in Chart  Comment 2 Notify RN   Heparin level (unfractionated)     Status: None   Collection Time: 06/24/14  7:41 AM  Result Value Ref Range   Heparin Unfractionated 0.37 0.30 - 0.70 IU/mL    Comment:        IF HEPARIN RESULTS ARE BELOW EXPECTED VALUES, AND PATIENT DOSAGE HAS BEEN CONFIRMED, SUGGEST FOLLOW UP TESTING OF ANTITHROMBIN III LEVELS.   Glucose, capillary     Status: Abnormal   Collection Time: 06/24/14  8:33 AM  Result Value Ref Range   Glucose-Capillary 104 (H) 70 - 99 mg/dL   Comment 1 Notify RN    Comment 2 Documented in Chart    CK total and CKMB (cardiac)     Status: Abnormal   Collection Time: 06/24/14 10:27 AM  Result Value Ref Range   Total CK 101 7 - 232 U/L   CK, MB 4.2 (H) 0.3 - 4.0 ng/mL   Relative Index 4.2 (H)  0.0 - 2.5  Troponin I (q 6hr x 3)     Status: Abnormal   Collection Time: 06/24/14 10:27 AM  Result Value Ref Range   Troponin I 4.69 (HH) <0.30 ng/mL    Comment:        Due to the release kinetics of cTnI, a negative result within the first hours of the onset of symptoms does not rule out myocardial infarction with certainty. If myocardial infarction is still suspected, repeat the test at appropriate intervals. CRITICAL VALUE NOTED.  VALUE IS CONSISTENT WITH PREVIOUSLY REPORTED AND CALLED VALUE.   Glucose, capillary     Status: Abnormal   Collection Time: 06/24/14 11:33 AM  Result Value Ref Range   Glucose-Capillary 133 (H) 70 - 99 mg/dL   Comment 1 Notify RN    Comment 2 Documented in Chart   Glucose, capillary     Status: None   Collection Time: 06/24/14  4:45 PM  Result Value Ref Range   Glucose-Capillary 95 70 - 99 mg/dL   Comment 1 Notify RN    Comment 2 Documented in Chart   Troponin I (q 6hr x 3)     Status: Abnormal   Collection Time: 06/24/14  4:46 PM  Result Value Ref Range   Troponin I 2.91 (HH) <0.30 ng/mL    Comment:        Due to the release kinetics of cTnI, a negative result within the first hours of the onset of symptoms does not rule out myocardial infarction with certainty. If myocardial infarction is still suspected, repeat the test at appropriate intervals. CRITICAL VALUE NOTED.  VALUE IS CONSISTENT WITH PREVIOUSLY REPORTED AND CALLED VALUE.   Heparin level (unfractionated)     Status: Abnormal   Collection Time: 06/24/14  6:45 PM  Result Value Ref Range   Heparin Unfractionated 0.28 (L) 0.30 - 0.70 IU/mL    Comment:        IF HEPARIN RESULTS ARE BELOW EXPECTED VALUES, AND PATIENT DOSAGE HAS BEEN CONFIRMED, SUGGEST FOLLOW UP TESTING OF ANTITHROMBIN III LEVELS.   Glucose, capillary     Status: Abnormal   Collection Time: 06/24/14  9:03 PM  Result Value Ref Range   Glucose-Capillary 110 (H) 70 - 99 mg/dL  Troponin I (q 6hr x 3)     Status:  Abnormal   Collection Time: 06/24/14  9:40 PM  Result Value Ref Range   Troponin I 3.72 (HH) <0.30 ng/mL    Comment:        Due to  the release kinetics of cTnI, a negative result within the first hours of the onset of symptoms does not rule out myocardial infarction with certainty. If myocardial infarction is still suspected, repeat the test at appropriate intervals. CRITICAL VALUE NOTED.  VALUE IS CONSISTENT WITH PREVIOUSLY REPORTED AND CALLED VALUE.   Glucose, capillary     Status: Abnormal   Collection Time: 06/25/14 12:13 AM  Result Value Ref Range   Glucose-Capillary 120 (H) 70 - 99 mg/dL   Comment 1 Documented in Chart    Comment 2 Notify RN   Heparin level (unfractionated)     Status: None   Collection Time: 06/25/14  3:42 AM  Result Value Ref Range   Heparin Unfractionated 0.55 0.30 - 0.70 IU/mL    Comment:        IF HEPARIN RESULTS ARE BELOW EXPECTED VALUES, AND PATIENT DOSAGE HAS BEEN CONFIRMED, SUGGEST FOLLOW UP TESTING OF ANTITHROMBIN III LEVELS.   CBC     Status: Abnormal   Collection Time: 06/25/14  3:42 AM  Result Value Ref Range   WBC 7.6 4.0 - 10.5 K/uL   RBC 3.55 (L) 4.22 - 5.81 MIL/uL   Hemoglobin 11.5 (L) 13.0 - 17.0 g/dL   HCT 23.5 (L) 10.5 - 04.0 %   MCV 93.5 78.0 - 100.0 fL   MCH 32.4 26.0 - 34.0 pg   MCHC 34.6 30.0 - 36.0 g/dL   RDW 30.6 06.7 - 15.1 %   Platelets 168 150 - 400 K/uL  Basic metabolic panel     Status: Abnormal   Collection Time: 06/25/14  3:42 AM  Result Value Ref Range   Sodium 135 (L) 137 - 147 mEq/L   Potassium 3.9 3.7 - 5.3 mEq/L   Chloride 102 96 - 112 mEq/L   CO2 20 19 - 32 mEq/L   Glucose, Bld 110 (H) 70 - 99 mg/dL   BUN 22 6 - 23 mg/dL   Creatinine, Ser 9.51 0.50 - 1.35 mg/dL   Calcium 8.3 (L) 8.4 - 10.5 mg/dL   GFR calc non Af Amer 59 (L) >90 mL/min   GFR calc Af Amer 69 (L) >90 mL/min    Comment: (NOTE) The eGFR has been calculated using the CKD EPI equation. This calculation has not been validated in all  clinical situations. eGFR's persistently <90 mL/min signify possible Chronic Kidney Disease.    Anion gap 13 5 - 15  Protime-INR     Status: Abnormal   Collection Time: 06/25/14  3:42 AM  Result Value Ref Range   Prothrombin Time 15.7 (H) 11.6 - 15.2 seconds   INR 1.24 0.00 - 1.49  Glucose, capillary     Status: Abnormal   Collection Time: 06/25/14  4:07 AM  Result Value Ref Range   Glucose-Capillary 104 (H) 70 - 99 mg/dL  Glucose, capillary     Status: Abnormal   Collection Time: 06/25/14  8:03 AM  Result Value Ref Range   Glucose-Capillary 110 (H) 70 - 99 mg/dL   Recent Results (from the past 240 hour(s))  Urine culture     Status: None   Collection Time: 06/23/14  6:48 PM  Result Value Ref Range Status   Specimen Description URINE, CLEAN CATCH  Final   Special Requests NONE  Final   Culture  Setup Time   Final    06/24/2014 01:10 Performed at [facility]    Colony Count NO GROWTH Performed at [facility]   Final   Culture NO  GROWTH Performed at [facility]   Final   Report Status 06/24/2014 FINAL  Final   Creatinine:  Recent Labs  06/23/14 1800 06/24/14 0406 06/25/14 0342  CREATININE 1.41* 1.07 1.07    Xrays: See report/chart none  Impression/Assessment:  Retention - cannot insert foley  Plan:  #14 Fr straight catheter inserted with steady firm pressure; mild reddish urine return; recommend to consider restraints of some sort; cathter looked a bit distal but draining well and in normal position by deep peroneal/urethral palpation Leave foley in until alert and mobile  Saidah Kempton A 06/25/2014, 4:42 PM

## 2014-06-25 NOTE — Interval H&P Note (Signed)
History and Physical Interval Note:  06/25/2014 9:22 AM  Henry Curtis  has presented today for surgery, with the diagnosis of NSTEMI.  The various methods of treatment have been discussed with the patient and family. After consideration of risks, benefits and other options for treatment, the patient's Daughter has consented to  Procedure(s): LEFT HEART CATHETERIZATION WITH CORONARY/GRAFT ANGIOGRAM (N/A) +/- PCI as a surgical intervention .  The patient's history has been reviewed, patient examined, no change in status, stable for surgery.  I have reviewed the patient's chart and labs.  Questions were answered to the patient's daughter;s satisfaction.    Cath Lab Visit (complete for each Cath Lab visit)  Clinical Evaluation Leading to the Procedure:   ACS: Yes.    Non-ACS:    Anginal Classification: No Symptoms; dyspneic & AMS  Anti-ischemic medical therapy: No Therapy  Non-Invasive Test Results: No non-invasive testing performed; Echo with EF ~45%  Prior CABG: Previous CABG  AUC: TIMI Score  Patient Information:  TIMI Score is 4   UA/NSTEMI and intermediate-risk features (e.g., TIMI score 3-4) for short-term risk of death or nonfatal MI  Revascularization of the presumed culprit artery   A (8)  Indication: 10; Score: 8   HARDING,DAVID W

## 2014-06-25 NOTE — Progress Notes (Signed)
Pt received from cath lab holding. Pt very confused, combative and agitated. Pt unable to answer any questions (ex. First name, birthdate, etc). Pt rambling uncomprehensible words, pulling at wires and heart monitor, and trying to get out of bed. Per report from cath lab, pt has been extremely agitated, with multiple medications given. MDs ([facility] and cardiology) paged. [facility] returned page and made aware. New order received for Haldol and Air cabin crew. Family at bedside and concerned due to patient does not recognize them. Family trying to calm the patient down and keep him in bed. Will continue to monitor closely.

## 2014-06-25 NOTE — Progress Notes (Signed)
ANTICOAGULATION CONSULT NOTE - Follow Up Consult  Pharmacy Consult for heparin Indication: NSTEMI  Labs:  Recent Labs  06/23/14 1800  06/24/14 0406 06/24/14 0741 06/24/14 1027 06/24/14 1646 06/24/14 1845 06/24/14 2140 06/25/14 0342  HGB 12.4*  --  11.3*  --   --   --   --   --  11.5*  HCT 36.4*  --  33.0*  --   --   --   --   --  33.2*  PLT 159  --  153  --   --   --   --   --  168  LABPROT  --   --   --   --   --   --   --   --  15.7*  INR  --   --   --   --   --   --   --   --  1.24  HEPARINUNFRC  --   --   --  0.37  --   --  0.28*  --  0.55  CREATININE 1.41*  --  1.07  --   --   --   --   --   --   CKTOTAL  --   --   --   --  101  --   --   --   --   CKMB  --   --   --   --  4.2*  --   --   --   --   TROPONINI 6.19*  < > 6.24*  --  4.69* 2.91*  --  3.72*  --   < > = values in this interval not displayed.   Assessment/Plan:  80yo male therapeutic on heparin after rate change. Will continue gtt at current rate and f/u after cath this am.   Wynona Neat, PharmD, BCPS  06/25/2014,4:56 AM

## 2014-06-25 NOTE — Brief Op Note (Signed)
BRIEF CARDIAC CATH - PCI NOTE  06/25/2014  1:08 PM  SURGEON:  Surgeon(s) and Role:    * Leonie Man, MD - Primary  PATIENT:  Henry Curtis  80 y.o. male with CAD-CABG in 12/1989 (LIMA-D1-LAD, SVG-RPDA with enarterectomy & patch angioplasty of RPDA) for Unstable Angina => 95% ostial rPDA & 90+% LAD-Diag bifurcation.  Has done well ever since. Former pt. Of DrsGwinda Passe & Tysinger - did not re-establish Cardiology care after they retired.  He who was brought in by his daughter who drove in from [place] because he has been staying in bed. She typically calls at 5 pm each day and when she called yesterday (Tuesday), he was still in bed and he thought it was 5 am. He has a twin sister who checked on him and thought he was just staying in bed. Otherwise, he completely denies chest pain, dyspnea, shortness of breath or any other symptoms.  He has ruled in for a non-ST elevation MI, and has noted dyspnea, but no chest tightness or pressure. Simply profound weakness  PRE-OPERATIVE DIAGNOSIS:  NSTEMI  POST-OPERATIVE DIAGNOSIS:  Severe multivessel CAD with   ostial occlusion of the LAD, mid occlusion of the RCA, proximal occlusion of the SVG-RPDA  mid occlusion of the circumflex after it gives off OM1. Laterals which fills a bifurcation point into OM 2 and OM 3.  LIMA-D1-LAD is widely patent.    The diagonal has diffuse mild disease but brisk TIMI-3 flow.  The LAD has a 95-99% focal stenosis just after the insertion of the LIMA. Beyond this there is a small diagonal branch and a septal perforator with a sharp bend at the septal perforator. At the septal there is a second 90% focal stenosis.  There is brisk septal perforator and distal LAD collaterals to the RPDA   PROCEDURE:    Catheter Placement for Native Coronary and Graft Angiography  Left radial artery access (6 [place]) -- TIG 4.0 catheter for native left and right as well as SVG-right angiography; IMA catheter for  LIMA-D1-LAD  Based on the extent of collateralization to the PDA through the severely diseased LAD, the decision was made to attempt PCI on the 2 lesions in the LAD.  2 site Percutaneous Coronary Intervention on the native LAD through the LIMA-D1-LAD: Mid LAD treated with a Xience alpine DES 2.5 mm x 18 mm; distal lesion unable to pass stent, was treated with PTCA only using a 2.0 mm balloon.  6 French LIMA catheter; unable to wire using Prowater wire. Finally used a whisper wire  Unfortunately with a whisper wire and patient movement, was unable to maintain adequate guide support and wire support to advance the stent to the distal lesion.  The proximal/mid LAD lesion was stented due to angioplasty localized dissection. Despite having a stent in place and was unable to pass a stent beyond the stent to the more distal lesion that comes just after a septal perforator takeoff.  ANESTHESIA:   2 mg Versed, 50 my commands fentanyl; 2% lidocaine 2 mL  EBL:  10 mL  MEDICATIONS USED:  Radial cocktail consisting of 3 mg verapamil and 2 ml lidocaine; 300 mg liters contrast; intracoronary nitroglycerin 200 g 3, Brilinta 180 mg  TOURNIQUET:  TR band placed at 1220 hrs. at 12 mL  DICTATION: .Note written in paper chart and Note written in EPIC  PLAN OF CARE: The patient will be transferred to the TCU for more aggressive monitoring. Is somewhat dyspneic, therefore we'll  administer 20 mg IV Lasix and start him on oral Lasix.   The patient is extremely debilitated and will likely need at least short-term rehabilitation prior to being discharged home since he lives home alone  He will need to be on dual endplate of therapy for minimum one year. I switched him from the Brilinta given in the Cath Lab to clip it relative to start in the morning.  Will run low-dose IV fluids for 18 hours based on the long duration of procedure and extensive amount of contrast used. Unfortunately based on the lesions once  PTCA was initiated, the more proximal lesion needed to be stented. Multiple inflations with a 2 mm balloon was unable to pass a stent to the distal lesion. Distal last apical LAD has mild focal dissection but with TIMI-3 flow.  PATIENT DISPOSITION:  PACU - hemodynamically stable.  Due to patient discomfort, with sensation of needing to urinate and cramping in his abdomen he continued to move his arm which caused loss of guide support several occasions. These 3 different occasions either completely rewired the lesion after resetting the guide catheter.  The patient does have increased work of breathing. We will place a Foley catheter to allow for easier voiding him since he is complaining of extreme urgency.  He was given 20 mg of IV Lasix in the holding area along with abnormal nitroglycerin. Will also administer 0.5 mg of Versed for anxiety.   He will clearly need closer monitoring upon completion of this procedure and will likely require a day or 2 in the stepdown unit for monitoring. I do not anticipate that he will be ready to discharge home to care for himself. He will probably need at least short-term rehabilitation.    Leonie Man, M.D., M.S. Interventional Cardiologist   Pager # [phone]

## 2014-06-25 NOTE — CV Procedure (Signed)
CARDIAC CATHETERIZATION AND PERCUTANEOUS CORONARY INTERVENTION REPORT  NAME:  KETHAN PAPADOPOULOS   MRN: [id number] DOB:  28-Jul-1921   ADMIT DATE: 06/23/2014 Procedure Date: 06/25/2014  INTERVENTIONAL CARDIOLOGIST: Leonie Man, M.D., MS PRIMARY CARE PROVIDER: Tommy Medal, MD PRIMARY CARDIOLOGIST: NEW TO [facility]  PATIENT:  MARSHA HILLMAN is a 80 y.o. male male with CAD-CABG in 12/1989 (LIMA-D1-LAD, SVG-RPDA with enarterectomy & patch angioplasty of RPDA) for Unstable Angina => 95% ostial rPDA & 90+% LAD-Diag bifurcation. Has done well ever since. Former pt. Of DrsGwinda Passe & Tysinger - did not re-establish Cardiology care after they retired.  He who was brought in by his daughter who drove in from [place] because he has been staying in bed. She typically calls at 5 pm each day and when she called yesterday (Tuesday), he was still in bed and he thought it was 5 am. He has a twin sister who checked on him and thought he was just staying in bed. Otherwise, he completely denies chest pain, dyspnea, shortness of breath or any other symptoms.  He has ruled in for a non-ST elevation MI, and has noted dyspnea, but no chest tightness or pressure. Simply profound weakness  PRE-OPERATIVE DIAGNOSIS:    Non-STEMI  Known CAD status post CABG  PROCEDURES PERFORMED:    Catheter Placement for Native Coronary And Graft Angiography  via The Radial Artery   Difficult, complex Percutaneous Coronary Intervention of the native LAD through the sequential LIMA-D1-LAD graft with placement of a single Xience Alpine DES 2.5 x 18 mm stent for the mid LAD lesion just after LIMA anastomosis and PTCA only of the distal LAD due to inability to pass a stent  PROCEDURE: The patient was brought to the [facility] in the fasting state and prepped and draped in the usual sterile fashion for left radial artery access. A modified Allen's test was performed on the left wrist  demonstrating excellent collateral flow for radial access.   Sterile technique was used including antiseptics, cap, gloves, gown, hand hygiene, mask and sheet. Skin prep: Chlorhexidine.   Consent: Risks of procedure as well as the alternatives and risks of each were explained to the (patient/caregiver). Consent for procedure obtained.   Time Out: Verified patient identification, verified procedure, site/side was marked, verified correct patient position, special equipment/implants available, medications/allergies/relevent history reviewed, required imaging and test results available. Performed.  Access:   Left Radial Artery: 6 Fr Sheath -  Technique (Angiocath Micropuncture Kit)  Radial Cocktail - 10 mL - without nitroglycerin and using only 3 mg of verapamil; IV Heparin 4000 Units  Coronary and Graft Angiography: 5 Fr Catheters advanced or exchanged over a long exchange safety J-wire; TIG 4.0 catheter advanced first.  Left Coronary Artery Cineangiography: TIG 4.0 Catheter  Right Coronary Artery, SVG-RCA  Cineangiography: TIG 4.0 Catheter  LIMA-LAD Cineangiography: LIMA Catheter   LV Hemodynamics (LV Gram): Unable to cross aortic valve due to inability to push the catheter secondary to tortuosity  Sheath removed in the cardia catheterization lab with TR band placement for hemostasis.  TR Band: 1220  Hours; 12 mL air  FINDINGS:  Hemodynamics:   Central Aortic Pressure / Mean: 96/45/64 mmHg  Coronary Anatomy:  Dominance: Right  Left Main: Short vessel that really only gives rise to the native circumflex. Moderate-caliber LAD: 100% occluded ostially  LIMA-D1-LAD: Widely patent graft with a side-to-side anastomosis to D1 and end to side anastomosis to the mid LAD. Just beyond the anastomosis to  the LAD there is a focal 95-99% stenosis just prior to a small second diagonal branch. The vessel then becomes somewhat normal until there is a take off of a small septal perforator. Just beyond  this branch there is another focal 95% stenosis before the vessel courses down around the apex.  There are extensive collateral vessels that come from the upstream and downstream septal perforators as well as the apical LAD.  This fills all the way back the PDA to the bifurcation into the posterior lateral system. The PDA itself is diffusely diseased.   D1: Fills via the primary limb of the LIMA, mild diffuse luminal irregularities at the anastomosis. No retrograde flow  D2: Small caliber vessel that fills after the LIMA anastomosis.  Left Circumflex: moderate-caliber vessel that is essentially the only vessel coming off the left main. There is a small moderate-caliber first obtuse marginal. After that in the AV groove the vessels appears to be 100% occluded/subtotally occluded with bridging collaterals filling distally to a bifurcation point into the OM 2 and OM 3. Both of these vessels are small moderate caliber but relatively free of disease after the bifurcation.   RCA: Likely large caliber vessel that is monitored occluded in the mid vessel.    SVG-RCA: 100% proximally occluded  RPDA: Fills via collaterals from the septal perforators and distal apical LAD.  RPL Sysytem:The RPAV fills via collaterals from the circumflex system  After reviewing the initial angiography, the culprit lesion was thought to be the final occlusion of the SVG-RCA. With the existing disease in the LAD after the LIMA as well as the nongrafted circumflex OM system, he likely did not have enough collateral flow to perfuse the RCA distribution acutely leading to his MI. After reviewing the angiography with interventional colleagues, we determined that the only PCI amenable targets are the tandem lesions in the mid and distal LAD.   Percutaneous Coronary Intervention:   Guide: 6 Fr   IMA Guidewire: Initially I used a pro-water wire which I was only able to advance into that septal perforator just prior to the second  lesion. I then advanced a long whisper wire into the distal LAD.  Unfortunately, the patient became uncomfortable the table and was moving his arm and hips quite a bit. This caused the guide catheter to pop in and out of engagement. It also caused loss of wire position. As a result a significant amount of contrast was used.  Lesions: Mid LAD 95%, distal LAD 95% with TIMI 2-3 flow pre-and post  Predilation Balloon: Sprinter OTW 1.5 mm x 12 mm;   Mid: 12 Atm x 47 Sec, distal 10 Atm x 45 Sec,  Unfortunately, during balloon catheter exchange the patient moved his arm and we lost guide positioning with the wire being pulled back into the graft. At this point I opted to use a short whisper wire and successfully rewired the distal vessel. Unfortunately that was wired did not give adequate wire support  Predilation Balloon: Euphora 2.0 mm x 12 mm;   Mid: 10 Atm x 32 Sec, distal 8 Atm x 30 Sec,  I was unable to pass a 2.25 mm a 12 mm signs alpine DES stent to these distal lesion. Therefore I chose to use a second balloon. The initial balloon became kinked during removal therefore second balloon was required.  Predilation Balloon: Emerge 2.0 mm x 12 mm;  Distal colon 10 Atm x 45 Sec, -- still unable to pass the distal stent  At  this point angiographic imaging revealed that the mid LAD lesion had a small focal dissection that would likely require stenting. I decided that it was best to treat this dissection before it progressed. I therefore chose to stent the more proximal lesion in the mid LAD and tried to stent afterwards  Stent: Xience Alpine DES 2.5 mm x 18 mm; spending from the distal portion of the IMA graft to beyond the mid lesion -- did not reach the septal perforator prior to the distal lesion.  12 Atm x 30 Sec - final diameter was 2.5 mm, an adequate sized.  Reduced stenosis from 95% to 0  Following this I attempted to advance the 2.25 mm x 12 mm stent distally and was still unable to. I  therefore went back in one more dilation of the distal stenosis with the emerge 2.0 mm x 12 mm balloon for PTCA only.  Was inflated to 10 atm for 60 seconds.  Reduced distal stenosis from 95% to roughly 50%.  In the apical portion of the LAD there appeared to be a small dissection that was not seen previously, potentially because of the wire versus because of improved antegrade flow. It appeared to be stable.  Unfortunately at this point the patient became agitated and moved again. I then lost wire placement and guide placement. He insisted on getting up, therefore I decided to take one more final angiographic image and concluded the case.  Post deployment angiography in multiple views, with and without guidewire in place revealed excellent stent deployment and lesion coverage.  There was evidence of a small focal dissection way downstream from the PTCAs site. Appears stable. There is improved collateral flow to the RCA system.Marland Kitchen  MEDICATIONS:  Anesthesia:  Local Lidocaine -2 mL  Sedation:  2 mg IV Versed, 50 mcg IV fentanyl ;   Omnipaque Contrast: 300 ml  Anticoagulation:  IV Heparin 4000 Units ; Angiomax Bolus & drip  Anti-Platelet Agent:  Brilinta 180 mg with plans to switch to Plavix in the morning  PATIENT DISPOSITION:    The patient was transferred to the PACU holding area in a hemodynamicaly stable, chest pain free condition.  He was however noted to be somewhat dyspneic, I therefore administered 20 mg of IV Lasix as well as supplemental nitroglycerin.  The patient tolerated the procedure well, and there were no complications with the exception of the patient's movements causing loss of catheter and guide position.    EBL:   < 20 ml  The patient was stable before and during the procedure besides mild agitation and movement. After the procedure he was somewhat dyspneic. He then became more agitated as he recovered from sedation. The most notable symptom was the need to urinate. In  and out catheter was advanced draining 900 mL from the bladder which was extremely distended. This initially alleviated his symptoms and he became very comfortable. Unfortunately shortly thereafter he became very agitated trying to sit up and moving around. He was administered 0.5 mg of Versed as well as some Haldol from the hospitalist service. In out catheterization was again performed draining another 900 mL's. Urology consultation was obtained because we were unable to advance a Foley catheter.Marland Kitchen  POST-OPERATIVE DIAGNOSIS:    Severe multivessel CAD with 100% occluded ostial LAD and mid RCA as well as subtotal occlusion of the mid circumflex with bridging collaterals filling OMs 2 and 3. New occlusion of the SVG-RPDA  Patent LIMA-D1-LAD but tandem mid and distal LAD disease providing collateral  flow to the RPDA  Successful PCI of the mid LAD 95% stenosis with a Xience Alpine DES stent, but only able to perform PTCA only of the distal LAD lesion reducing it from 95% to 50% -with inability to pass a stent beyond the bend point at the septal perforator just proximal to this lesion.   PLAN OF CARE:  Based on the patient's level of agitation, the amount of contrast administered with the need for IV fluids, and the patient's age I felt was best for him to be transferred to the [facility] for postprocedure monitoring.  I have written for IV fluids to run for 18 hours based on the long duration of procedure with the amount contrast administered.  I have written for oral Lasix to be administered twice a day.  Dual antiplatelet therapy for minimum of 1 year. Would probably convert to Plavix in the morning.  The patient is extremely debilitated and will likely require at least short-term rehabilitation in an inpatient setting prior to being discharged home alone.  I would anticipate 1-2 days in the stepdown unit for stabilization.   I discussed the case with Dr. Clementeen Graham from tried hospital service who is  graciously kind enough to assist with managing the patient and post procedure unit.   Leonie Man, M.D., M.S. Interventional Cardiologist   Pager # [phone]

## 2014-06-25 NOTE — Progress Notes (Signed)
Dr harding in to see pt and talk to family and pt about results of test and plan of care.   Pt with expiratory wheezing.  MD aware and orders given for Lasix 20mg  IV and Ntg 0.88mSL.  Meds given as ordered.

## 2014-06-25 NOTE — Progress Notes (Signed)
Patient Name: NIJAH ORLICH Date of Encounter: 06/25/2014  Primary Cardiologist: new   SUBJECTIVE  Denies any CP; mild dyspnea  CURRENT MEDS . aspirin  81 mg Oral Pre-Cath  . aspirin EC  325 mg Oral Daily  . atorvastatin  80 mg Oral q1800  . cefTRIAXone (ROCEPHIN)  IV  1 g Intravenous QHS  . feeding supplement (ENSURE COMPLETE)  237 mL Oral Q1500  . sodium chloride  3 mL Intravenous Q12H  . sodium chloride  3 mL Intravenous Q12H    OBJECTIVE  Filed Vitals:   06/24/14 0032 06/24/14 0428 06/24/14 0436 06/24/14 1920  BP: 94/60 84/49 86/50  99/47  Pulse: 62 52  65  Temp:  98.2 F (36.8 C)  98.3 F (36.8 C)  TempSrc:  Oral  Oral  Resp:  19  20  Height:      Weight:  167 lb 6.4 oz (75.932 kg)    SpO2: 98% 96%  92%    Intake/Output Summary (Last 24 hours) at 06/25/14 0801 Last data filed at 06/24/14 2120  Gross per 24 hour  Intake    123 ml  Output    100 ml  Net     23 ml   Filed Weights   06/23/14 1803 06/23/14 2149 06/24/14 0428  Weight: 173 lb (78.472 kg) 167 lb 14.4 oz (76.159 kg) 167 lb 6.4 oz (75.932 kg)    PHYSICAL EXAM  General: Pleasant, NAD. Neuro: Alert and oriented X 3. Moves all extremities spontaneously. HEENT:  Normal  Neck: Supple  Lungs:  Diminished BS bases; mild exp wheeze Heart: RRR Abdomen: Soft, non-tender, non-distended Extremities: No edema.   Accessory Clinical Findings  CBC  Recent Labs  06/23/14 1800 06/24/14 0406 06/25/14 0342  WBC 10.3 6.9 7.6  NEUTROABS 6.5 3.9  --   HGB 12.4* 11.3* 11.5*  HCT 36.4* 33.0* 33.2*  MCV 94.3 93.5 93.5  PLT 159 153 637   Basic Metabolic Panel  Recent Labs  06/24/14 0406 06/25/14 0342  NA 137 135*  K 3.3* 3.9  CL 104 102  CO2 23 20  GLUCOSE 101* 110*  BUN 27* 22  CREATININE 1.07 1.07  CALCIUM 8.6 8.3*   Liver Function Tests  Recent Labs  06/23/14 1800 06/24/14 0406  AST 42* 28  ALT 19 15  ALKPHOS 60 52  BILITOT 0.4 0.8  PROT 7.0 6.0  ALBUMIN 3.2* 2.6*   Cardiac  Enzymes  Recent Labs  06/24/14 1027 06/24/14 1646 06/24/14 2140  CKTOTAL 101  --   --   CKMB 4.2*  --   --   TROPONINI 4.69* 2.91* 3.72*   Fasting Lipid Panel  Recent Labs  06/24/14 0500  CHOL 147  HDL 42  LDLCALC 94  TRIG 57  CHOLHDL 3.5   Thyroid Function Tests  Recent Labs  06/24/14 0406  TSH 2.830     Radiology/Studies  Ct Head Wo Contrast  06/23/2014   CLINICAL DATA:  Mental status change.  Confusion.  EXAM: CT HEAD WITHOUT CONTRAST  TECHNIQUE: Contiguous axial images were obtained from the base of the skull through the vertex without intravenous contrast.  COMPARISON:  None.  FINDINGS: Stable age related cerebral atrophy, ventriculomegaly and periventricular white matter disease. No extra-axial fluid collections are identified. No CT findings for acute hemispheric infarction or intracranial hemorrhage. No mass lesions. The brainstem and cerebellum are normal.  The No acute bony findings. The paranasal sinuses and mastoid air cells are clear. The globes are intact.  IMPRESSION: Age related cerebral atrophy, ventriculomegaly periventricular white matter disease. No acute intracranial findings or mass lesions.   Electronically Signed   By: Kalman Jewels M.D.   On: 06/23/2014 19:55   Mr Brain Wo Contrast  06/24/2014   CLINICAL DATA:  80 year old male with acute encephalopathy, found confused. Initial encounter.  EXAM: MRI HEAD WITHOUT CONTRAST  TECHNIQUE: Multiplanar, multiecho pulse sequences of the brain and surrounding structures were obtained without intravenous contrast.  COMPARISON:  Head CT without contrast 06/23/2014.  FINDINGS: Cerebral volume is within normal limits for age. No restricted diffusion to suggest acute infarction. No midline shift, mass effect, evidence of mass lesion, ventriculomegaly, extra-axial collection or acute intracranial hemorrhage. Cervicomedullary junction and pituitary are within normal limits. Negative visualized cervical spine. Major  intracranial vascular flow voids are preserved.  Mild for age nonspecific mostly periventricular cerebral white matter T2 and FLAIR hyperintensity. No cortical encephalomalacia. There is a chronic lacunar infarct in the left thalamus. Other deep gray matter nuclei, the brainstem, and cerebellum are within normal limits for age. Visible internal auditory structures appear normal.  Trace left mastoid fluid. Negative visualized nasopharynx. Trace paranasal sinus mucosal thickening. Postoperative changes to both globes. Visualized scalp soft tissues are within normal limits. Normal bone marrow signal.  IMPRESSION: 1.  No acute intracranial abnormality. 2. Mild for age chronic small vessel disease.   Electronically Signed   By: Lars Pinks M.D.   On: 06/24/2014 16:29   Dg Chest Port 1 View  06/23/2014   CLINICAL DATA:  80 year old male with acute myocardial infarction. Initial encounter.  EXAM: PORTABLE CHEST - 1 VIEW  COMPARISON:  07/01/2008 chest CT  FINDINGS: Cardiomegaly and CABG changes noted.  There is no evidence of focal airspace disease, pulmonary edema, suspicious pulmonary nodule/mass, pleural effusion, or pneumothorax. No acute bony abnormalities are identified.  IMPRESSION: Cardiomegaly without evidence of acute cardiopulmonary disease.   Electronically Signed   By: Hassan Rowan M.D.   On: 06/23/2014 23:48    ASSESSMENT AND PLAN  Mr. Eriq Hufford is an 80 yo man with PMH of hypertension, CAD and previous CABG who presents with daughter with some changes in behavior largely fatigue/weakness and found to have an NSTEMI.   1. NSTEMI  - no obvious CP. Came in with AMS, fatigue and weakness  - Troponin trending down; continue ASA, heparin and statin; add beta blocker later if BP improves. Echo shows EF 45-50. He is more dyspneic this AM; DC IVFs and will give one dose of lasix 20 mg IV; proceed with cath. Follow renal function closlely following procedure.  2. CAD s/p CABG in 1990  - no prior record of  graft location even though per pt, CABG was done at [facility]; continue ASA and statin.  3. HTN - BP borderline; ARB and beta blocker on hold   4. Former smoker   Visual merchandiser, [employer]

## 2014-06-25 NOTE — Progress Notes (Signed)
[facility] PROGRESS NOTE  JIANNI BATTEN QIO:[id number] DOB: 21-Jan-1922 DOA: 06/23/2014 PCP: Tommy Medal, MD  Brief narrative 80 year old male with history of coronary artery disease status post CABG, hypertension, was brought to the ED by his daughter after he was found to be more confused and had not gotten out of bed for past 2 days as per his daughter. His daughter lives in [place] and contacts patient daily. At baseline patient lives alone and is fairly independent. 2-D prior to admission patient's daughter did not hear back from him. When she called him the next day he seemed confused. She was concerned that he was not feeling well and drove here. Patient was taken to PCP office where UA was suggestive of a UTI and EKG showed ST depression and he was referred to the ED. In the ED patient was confused. A CT angiogram of the chest was negative for PE. Patient's KG showed ST depression and troponin was artery elevated. Patient placed on IV heparin drip and cardiology consulted.  Assessment/Plan: NSTEMI ST depression changes on EKG and progressively elevated troponins ( peaked at 6.91). Patient placed on full dose aspirin, heparin and statin. Held beta blocker due to low blood pressure.troponin .  he does not have any chest discomfort or shortness of breath. The patient's total CPK is normal with CK-MB of 4.2. LDL of 94. Daughter reports patient has not been intolerant to statin due to muscle aches. Cardiac cath done today shows severe multivessel CAD with  ostial occlusion of the LAD, mid occlusion of the RCA, proximal occlusion of the SVG-RPDA, mid occlusion of the circumflex after it gives off OM1. Laterals which fills a bifurcation point into OM 2 and OM 3. Pt underwent DES placement over mid LAD and PTCA of distal lesion.  Pt wheezy after procedure and given 20 mg IV lasix. Admit to transition care unit overnight. On dual antiplatelet therapy for at least 1 year.   ?UTI On  empiric Rocephin. Follow urine culture   Urinary retention Post procedure , underlying UTI , and hx fo prostate ca. Unable to pass catheter in recovery area. Urologist consulted by cardiology.  Acute encephalopathy and agitation  post recovery from anesthesia. Ordered low dose haldol. Monitor closely  Mild acute kidney injury Monitor with IV hydration. Holding ARB due to low blood pressure  Hypokalemia Replenished  DVT prophylaxis: IV heparin Diet: Heart healthy,     Consultants:  Cardiology  Procedures:  2-D echo  Cardiac cath on 12/4  Antibiotics:  Rocephin    Code Status: full code Family Communication: daughter  at bedside Disposition Plan: monitor in stepdown tonight. Will need SNF   Consultants:  cardiology  Procedures:  Cardiac cath  Antibiotics:  IV rocephin  HPI/Subjective: Pt seen in recovery area post cath. Agitated and confused post sedation. Unable to void.  Objective: Filed Vitals:   06/25/14 1423  BP: 124/57  Pulse: 74  Temp:   Resp: 28    Intake/Output Summary (Last 24 hours) at 06/25/14 1449 Last data filed at 06/25/14 1412  Gross per 24 hour  Intake      3 ml  Output   2000 ml  Net  -1997 ml   Filed Weights   06/23/14 1803 06/23/14 2149 06/24/14 0428  Weight: 78.472 kg (173 lb) 76.159 kg (167 lb 14.4 oz) 75.932 kg (167 lb 6.4 oz)    Exam:   General: elderly male in bed , agitated an confused  Cardiovascular: S1&S2 tachycardic  Respiratory: bilateral  wheezes, no crackles  Abdomen: soft, ND, suprapubic tenderness  Musculoskeletal: warm, no edema  CNS: confused  Data Reviewed: Basic Metabolic Panel:  Recent Labs Lab 06/23/14 1800 06/24/14 0406 06/25/14 0342  NA 139 137 135*  K 4.0 3.3* 3.9  CL 102 104 102  CO2 24 23 20   GLUCOSE 104* 101* 110*  BUN 34* 27* 22  CREATININE 1.41* 1.07 1.07  CALCIUM 9.0 8.6 8.3*   Liver Function Tests:  Recent Labs Lab 06/23/14 1800 06/24/14 0406  AST 42* 28   ALT 19 15  ALKPHOS 60 52  BILITOT 0.4 0.8  PROT 7.0 6.0  ALBUMIN 3.2* 2.6*   No results for input(s): LIPASE, AMYLASE in the last 168 hours. No results for input(s): AMMONIA in the last 168 hours. CBC:  Recent Labs Lab 06/23/14 1800 06/24/14 0406 06/25/14 0342  WBC 10.3 6.9 7.6  NEUTROABS 6.5 3.9  --   HGB 12.4* 11.3* 11.5*  HCT 36.4* 33.0* 33.2*  MCV 94.3 93.5 93.5  PLT 159 153 168   Cardiac Enzymes:  Recent Labs Lab 06/23/14 2315 06/24/14 0406 06/24/14 1027 06/24/14 1646 06/24/14 2140  CKTOTAL  --   --  101  --   --   CKMB  --   --  4.2*  --   --   TROPONINI 6.91* 6.24* 4.69* 2.91* 3.72*   BNP (last 3 results) No results for input(s): PROBNP in the last 8760 hours. CBG:  Recent Labs Lab 06/24/14 1645 06/24/14 2103 06/25/14 0013 06/25/14 0407 06/25/14 0803  GLUCAP 95 110* 120* 104* 110*    Recent Results (from the past 240 hour(s))  Urine culture     Status: None   Collection Time: 06/23/14  6:48 PM  Result Value Ref Range Status   Specimen Description URINE, CLEAN CATCH  Final   Special Requests NONE  Final   Culture  Setup Time   Final    06/24/2014 01:10 Performed at [facility] Performed at [facility]   Final   Culture NO GROWTH Performed at [facility]   Final   Report Status 06/24/2014 FINAL  Final     Studies: Ct Head Wo Contrast  06/23/2014   CLINICAL DATA:  Mental status change.  Confusion.  EXAM: CT HEAD WITHOUT CONTRAST  TECHNIQUE: Contiguous axial images were obtained from the base of the skull through the vertex without intravenous contrast.  COMPARISON:  None.  FINDINGS: Stable age related cerebral atrophy, ventriculomegaly and periventricular white matter disease. No extra-axial fluid collections are identified. No CT findings for acute hemispheric infarction or intracranial hemorrhage. No mass lesions. The brainstem and cerebellum are normal.  The No acute bony findings. The  paranasal sinuses and mastoid air cells are clear. The globes are intact.  IMPRESSION: Age related cerebral atrophy, ventriculomegaly periventricular white matter disease. No acute intracranial findings or mass lesions.   Electronically Signed   By: Kalman Jewels M.D.   On: 06/23/2014 19:55   Mr Brain Wo Contrast  06/24/2014   CLINICAL DATA:  80 year old male with acute encephalopathy, found confused. Initial encounter.  EXAM: MRI HEAD WITHOUT CONTRAST  TECHNIQUE: Multiplanar, multiecho pulse sequences of the brain and surrounding structures were obtained without intravenous contrast.  COMPARISON:  Head CT without contrast 06/23/2014.  FINDINGS: Cerebral volume is within normal limits for age. No restricted diffusion to suggest acute infarction. No midline shift, mass effect, evidence of mass lesion, ventriculomegaly, extra-axial collection or acute  intracranial hemorrhage. Cervicomedullary junction and pituitary are within normal limits. Negative visualized cervical spine. Major intracranial vascular flow voids are preserved.  Mild for age nonspecific mostly periventricular cerebral white matter T2 and FLAIR hyperintensity. No cortical encephalomalacia. There is a chronic lacunar infarct in the left thalamus. Other deep gray matter nuclei, the brainstem, and cerebellum are within normal limits for age. Visible internal auditory structures appear normal.  Trace left mastoid fluid. Negative visualized nasopharynx. Trace paranasal sinus mucosal thickening. Postoperative changes to both globes. Visualized scalp soft tissues are within normal limits. Normal bone marrow signal.  IMPRESSION: 1.  No acute intracranial abnormality. 2. Mild for age chronic small vessel disease.   Electronically Signed   By: Lars Pinks M.D.   On: 06/24/2014 16:29   Dg Chest Port 1 View  06/23/2014   CLINICAL DATA:  80 year old male with acute myocardial infarction. Initial encounter.  EXAM: PORTABLE CHEST - 1 VIEW  COMPARISON:   07/01/2008 chest CT  FINDINGS: Cardiomegaly and CABG changes noted.  There is no evidence of focal airspace disease, pulmonary edema, suspicious pulmonary nodule/mass, pleural effusion, or pneumothorax. No acute bony abnormalities are identified.  IMPRESSION: Cardiomegaly without evidence of acute cardiopulmonary disease.   Electronically Signed   By: Hassan Rowan M.D.   On: 06/23/2014 23:48    Scheduled Meds: . aspirin  81 mg Oral Pre-Cath  . [MAR Hold] aspirin EC  325 mg Oral Daily  . [MAR Hold] atorvastatin  80 mg Oral q1800  . [MAR Hold] cefTRIAXone (ROCEPHIN)  IV  1 g Intravenous QHS  . [MAR Hold] feeding supplement (ENSURE COMPLETE)  237 mL Oral Q1500  . nitroGLYCERIN  1 spray Sublingual Once  . [MAR Hold] sodium chloride  3 mL Intravenous Q12H  . sodium chloride  3 mL Intravenous Q12H   Continuous Infusions: . sodium chloride    . bivalirudin (ANGIOMAX) infusion 5 mg/mL (Cath Lab,ACS,PCI indication)    . heparin 1,250 Units/hr (06/24/14 2118)     Time spent: 25 minutes    Louellen Molder  [facility] Pager [phone] 7PM-7AM, please contact night-coverage at [email or web address], password [facility] 06/25/2014, 2:49 PM  LOS: 2 days

## 2014-06-25 NOTE — H&P (View-Only) (Signed)
Patient Name: MUSAB WINGARD Date of Encounter: 06/25/2014  Primary Cardiologist: new   SUBJECTIVE  Denies any CP; mild dyspnea  CURRENT MEDS . aspirin  81 mg Oral Pre-Cath  . aspirin EC  325 mg Oral Daily  . atorvastatin  80 mg Oral q1800  . cefTRIAXone (ROCEPHIN)  IV  1 g Intravenous QHS  . feeding supplement (ENSURE COMPLETE)  237 mL Oral Q1500  . sodium chloride  3 mL Intravenous Q12H  . sodium chloride  3 mL Intravenous Q12H    OBJECTIVE  Filed Vitals:   06/24/14 0032 06/24/14 0428 06/24/14 0436 06/24/14 1920  BP: 94/60 84/49 86/50  99/47  Pulse: 62 52  65  Temp:  98.2 F (36.8 C)  98.3 F (36.8 C)  TempSrc:  Oral  Oral  Resp:  19  20  Height:      Weight:  167 lb 6.4 oz (75.932 kg)    SpO2: 98% 96%  92%    Intake/Output Summary (Last 24 hours) at 06/25/14 0801 Last data filed at 06/24/14 2120  Gross per 24 hour  Intake    123 ml  Output    100 ml  Net     23 ml   Filed Weights   06/23/14 1803 06/23/14 2149 06/24/14 0428  Weight: 173 lb (78.472 kg) 167 lb 14.4 oz (76.159 kg) 167 lb 6.4 oz (75.932 kg)    PHYSICAL EXAM  General: Pleasant, NAD. Neuro: Alert and oriented X 3. Moves all extremities spontaneously. HEENT:  Normal  Neck: Supple  Lungs:  Diminished BS bases; mild exp wheeze Heart: RRR Abdomen: Soft, non-tender, non-distended Extremities: No edema.   Accessory Clinical Findings  CBC  Recent Labs  06/23/14 1800 06/24/14 0406 06/25/14 0342  WBC 10.3 6.9 7.6  NEUTROABS 6.5 3.9  --   HGB 12.4* 11.3* 11.5*  HCT 36.4* 33.0* 33.2*  MCV 94.3 93.5 93.5  PLT 159 153 784   Basic Metabolic Panel  Recent Labs  06/24/14 0406 06/25/14 0342  NA 137 135*  K 3.3* 3.9  CL 104 102  CO2 23 20  GLUCOSE 101* 110*  BUN 27* 22  CREATININE 1.07 1.07  CALCIUM 8.6 8.3*   Liver Function Tests  Recent Labs  06/23/14 1800 06/24/14 0406  AST 42* 28  ALT 19 15  ALKPHOS 60 52  BILITOT 0.4 0.8  PROT 7.0 6.0  ALBUMIN 3.2* 2.6*   Cardiac  Enzymes  Recent Labs  06/24/14 1027 06/24/14 1646 06/24/14 2140  CKTOTAL 101  --   --   CKMB 4.2*  --   --   TROPONINI 4.69* 2.91* 3.72*   Fasting Lipid Panel  Recent Labs  06/24/14 0500  CHOL 147  HDL 42  LDLCALC 94  TRIG 57  CHOLHDL 3.5   Thyroid Function Tests  Recent Labs  06/24/14 0406  TSH 2.830     Radiology/Studies  Ct Head Wo Contrast  06/23/2014   CLINICAL DATA:  Mental status change.  Confusion.  EXAM: CT HEAD WITHOUT CONTRAST  TECHNIQUE: Contiguous axial images were obtained from the base of the skull through the vertex without intravenous contrast.  COMPARISON:  None.  FINDINGS: Stable age related cerebral atrophy, ventriculomegaly and periventricular white matter disease. No extra-axial fluid collections are identified. No CT findings for acute hemispheric infarction or intracranial hemorrhage. No mass lesions. The brainstem and cerebellum are normal.  The No acute bony findings. The paranasal sinuses and mastoid air cells are clear. The globes are intact.  IMPRESSION: Age related cerebral atrophy, ventriculomegaly periventricular white matter disease. No acute intracranial findings or mass lesions.   Electronically Signed   By: Kalman Jewels M.D.   On: 06/23/2014 19:55   Mr Brain Wo Contrast  06/24/2014   CLINICAL DATA:  80 year old male with acute encephalopathy, found confused. Initial encounter.  EXAM: MRI HEAD WITHOUT CONTRAST  TECHNIQUE: Multiplanar, multiecho pulse sequences of the brain and surrounding structures were obtained without intravenous contrast.  COMPARISON:  Head CT without contrast 06/23/2014.  FINDINGS: Cerebral volume is within normal limits for age. No restricted diffusion to suggest acute infarction. No midline shift, mass effect, evidence of mass lesion, ventriculomegaly, extra-axial collection or acute intracranial hemorrhage. Cervicomedullary junction and pituitary are within normal limits. Negative visualized cervical spine. Major  intracranial vascular flow voids are preserved.  Mild for age nonspecific mostly periventricular cerebral white matter T2 and FLAIR hyperintensity. No cortical encephalomalacia. There is a chronic lacunar infarct in the left thalamus. Other deep gray matter nuclei, the brainstem, and cerebellum are within normal limits for age. Visible internal auditory structures appear normal.  Trace left mastoid fluid. Negative visualized nasopharynx. Trace paranasal sinus mucosal thickening. Postoperative changes to both globes. Visualized scalp soft tissues are within normal limits. Normal bone marrow signal.  IMPRESSION: 1.  No acute intracranial abnormality. 2. Mild for age chronic small vessel disease.   Electronically Signed   By: Lars Pinks M.D.   On: 06/24/2014 16:29   Dg Chest Port 1 View  06/23/2014   CLINICAL DATA:  80 year old male with acute myocardial infarction. Initial encounter.  EXAM: PORTABLE CHEST - 1 VIEW  COMPARISON:  07/01/2008 chest CT  FINDINGS: Cardiomegaly and CABG changes noted.  There is no evidence of focal airspace disease, pulmonary edema, suspicious pulmonary nodule/mass, pleural effusion, or pneumothorax. No acute bony abnormalities are identified.  IMPRESSION: Cardiomegaly without evidence of acute cardiopulmonary disease.   Electronically Signed   By: Hassan Rowan M.D.   On: 06/23/2014 23:48    ASSESSMENT AND PLAN  Mr. Maude Gloor is an 80 yo man with PMH of hypertension, CAD and previous CABG who presents with daughter with some changes in behavior largely fatigue/weakness and found to have an NSTEMI.   1. NSTEMI  - no obvious CP. Came in with AMS, fatigue and weakness  - Troponin trending down; continue ASA, heparin and statin; add beta blocker later if BP improves. Echo shows EF 45-50. He is more dyspneic this AM; DC IVFs and will give one dose of lasix 20 mg IV; proceed with cath. Follow renal function closlely following procedure.  2. CAD s/p CABG in 1990  - no prior record of  graft location even though per pt, CABG was done at [facility]; continue ASA and statin.  3. HTN - BP borderline; ARB and beta blocker on hold   4. Former smoker   Visual merchandiser, [employer]

## 2014-06-26 ENCOUNTER — Inpatient Hospital Stay (HOSPITAL_COMMUNITY): Payer: Medicare Other

## 2014-06-26 DIAGNOSIS — I2510 Atherosclerotic heart disease of native coronary artery without angina pectoris: Secondary | ICD-10-CM

## 2014-06-26 LAB — GLUCOSE, CAPILLARY
GLUCOSE-CAPILLARY: 114 mg/dL — AB (ref 70–99)
GLUCOSE-CAPILLARY: 95 mg/dL (ref 70–99)
Glucose-Capillary: 95 mg/dL (ref 70–99)
Glucose-Capillary: 118 mg/dL — ABNORMAL HIGH (ref 70–99)

## 2014-06-26 LAB — URINALYSIS, ROUTINE W REFLEX MICROSCOPIC
Glucose, UA: NEGATIVE mg/dL
Ketones, ur: 40 mg/dL — AB
Nitrite: NEGATIVE
PROTEIN: 100 mg/dL — AB
Specific Gravity, Urine: 1.043 — ABNORMAL HIGH (ref 1.005–1.030)
UROBILINOGEN UA: 1 mg/dL (ref 0.0–1.0)
pH: 5 (ref 5.0–8.0)

## 2014-06-26 LAB — URINE MICROSCOPIC-ADD ON

## 2014-06-26 LAB — CBC
HCT: 32.8 % — ABNORMAL LOW (ref 39.0–52.0)
Hemoglobin: 11.7 g/dL — ABNORMAL LOW (ref 13.0–17.0)
MCH: 33.6 pg (ref 26.0–34.0)
MCHC: 35.7 g/dL (ref 30.0–36.0)
MCV: 94.3 fL (ref 78.0–100.0)
Platelets: 179 10*3/uL (ref 150–400)
RBC: 3.48 MIL/uL — AB (ref 4.22–5.81)
RDW: 12.8 % (ref 11.5–15.5)
WBC: 11.1 10*3/uL — ABNORMAL HIGH (ref 4.0–10.5)

## 2014-06-26 LAB — BASIC METABOLIC PANEL
ANION GAP: 16 — AB (ref 5–15)
BUN: 18 mg/dL (ref 6–23)
CALCIUM: 8.7 mg/dL (ref 8.4–10.5)
CO2: 20 mEq/L (ref 19–32)
Chloride: 101 mEq/L (ref 96–112)
Creatinine, Ser: 1.14 mg/dL (ref 0.50–1.35)
GFR calc Af Amer: 64 mL/min — ABNORMAL LOW (ref >90)
GFR, EST NON AFRICAN AMERICAN: 55 mL/min — AB (ref >90)
Glucose, Bld: 116 mg/dL — ABNORMAL HIGH (ref 70–99)
Potassium: 3.2 mEq/L — ABNORMAL LOW (ref 3.7–5.3)
SODIUM: 137 meq/L (ref 137–147)

## 2014-06-26 LAB — MRSA PCR SCREENING: MRSA by PCR: NEGATIVE

## 2014-06-26 LAB — POCT ACTIVATED CLOTTING TIME: ACTIVATED CLOTTING TIME: 577 s

## 2014-06-26 LAB — MAGNESIUM: Magnesium: 1.9 mg/dL (ref 1.5–2.5)

## 2014-06-26 MED ORDER — POTASSIUM CHLORIDE 10 MEQ/100ML IV SOLN
INTRAVENOUS | Status: AC
  Filled 2014-06-26: qty 300

## 2014-06-26 MED ORDER — FUROSEMIDE 10 MG/ML IJ SOLN
40.0000 mg | Freq: Once | INTRAMUSCULAR | Status: AC
  Administered 2014-06-26: 40 mg via INTRAVENOUS
  Filled 2014-06-26: qty 4

## 2014-06-26 MED ORDER — POTASSIUM CHLORIDE CRYS ER 20 MEQ PO TBCR
40.0000 meq | EXTENDED_RELEASE_TABLET | Freq: Once | ORAL | Status: AC
  Administered 2014-06-26: 40 meq via ORAL
  Filled 2014-06-26: qty 2

## 2014-06-26 MED ORDER — POTASSIUM CHLORIDE 10 MEQ/100ML IV SOLN
10.0000 meq | INTRAVENOUS | Status: AC
  Administered 2014-06-26 (×3): 10 meq via INTRAVENOUS

## 2014-06-26 NOTE — Progress Notes (Signed)
Subjective:  Combative overnight. Now sedate.   Objective:  Vital Signs in the last 24 hours: Temp:  [97.8 F (36.6 C)-98.6 F (37 C)] 98.6 F (37 C) (12/05 0700) Pulse Rate:  [43-149] 69 (12/05 0900) Resp:  [19-37] 27 (12/05 0900) BP: (86-174)/(31-124) 100/52 mmHg (12/05 0900) SpO2:  [91 %-100 %] 99 % (12/05 0900) Weight:  [169 lb 5 oz (76.8 kg)] 169 lb 5 oz (76.8 kg) (12/05 0502)  Intake/Output from previous day: 12/04 0701 - 12/05 0700 In: 2887.5 [I.V.:637.5; IV Piggyback:350] Out: 1900 [Urine:1900]   Physical Exam: General: Well developed, well nourished, sedate. Head:  Normocephalic and atraumatic. Lungs: Clear to auscultation and percussion, upper airway noise. Heart: Normal S1 and S2 occasional ectopy.  No murmur, rubs or gallops.  Abdomen: soft, non-tender, positive bowel sounds. Extremities: No clubbing or cyanosis. No edema. Soft restraints.  Neurologic: Sedate    Lab Results:  Recent Labs  06/25/14 0342 06/26/14 0321  WBC 7.6 11.1*  HGB 11.5* 11.7*  PLT 168 179    Recent Labs  06/25/14 0342 06/26/14 0321  NA 135* 137  K 3.9 3.2*  CL 102 101  CO2 20 20  GLUCOSE 110* 116*  BUN 22 18  CREATININE 1.07 1.14    Recent Labs  06/24/14 1646 06/24/14 2140  TROPONINI 2.91* 3.72*   Hepatic Function Panel  Recent Labs  06/24/14 0406  PROT 6.0  ALBUMIN 2.6*  AST 28  ALT 15  ALKPHOS 52  BILITOT 0.8    Recent Labs  06/24/14 0500  CHOL 147     Imaging: Mr Brain Wo Contrast  06/24/2014   CLINICAL DATA:  80 year old male with acute encephalopathy, found confused. Initial encounter.  EXAM: MRI HEAD WITHOUT CONTRAST  TECHNIQUE: Multiplanar, multiecho pulse sequences of the brain and surrounding structures were obtained without intravenous contrast.  COMPARISON:  Head CT without contrast 06/23/2014.  FINDINGS: Cerebral volume is within normal limits for age. No restricted diffusion to suggest acute infarction. No midline shift, mass  effect, evidence of mass lesion, ventriculomegaly, extra-axial collection or acute intracranial hemorrhage. Cervicomedullary junction and pituitary are within normal limits. Negative visualized cervical spine. Major intracranial vascular flow voids are preserved.  Mild for age nonspecific mostly periventricular cerebral white matter T2 and FLAIR hyperintensity. No cortical encephalomalacia. There is a chronic lacunar infarct in the left thalamus. Other deep gray matter nuclei, the brainstem, and cerebellum are within normal limits for age. Visible internal auditory structures appear normal.  Trace left mastoid fluid. Negative visualized nasopharynx. Trace paranasal sinus mucosal thickening. Postoperative changes to both globes. Visualized scalp soft tissues are within normal limits. Normal bone marrow signal.  IMPRESSION: 1.  No acute intracranial abnormality. 2. Mild for age chronic small vessel disease.   Electronically Signed   By: Henry Curtis M.D.   On: 06/24/2014 16:29    Telemetry: NSR with PAC Personally viewed.   Assessment/Plan:   [name] is an 80 yo man with PMH of hypertension, CAD and previous CABG who presents with daughter with some changes in behavior largely fatigue/weakness and found to have an NSTEMI.   1. NSTEMI - no obvious CP. Came in with AMS, fatigue and weakness - Troponin trending down (peaked 6.9); continue DAPT,  and statin; add beta blocker later if BP improves. Echo shows EF 45-50.   - Cath performed on 06/25/14 - Successful PCI of the mid LAD 95% stenosis with a Xience Alpine DES stent, but only able to perform PTCA  only of the distal LAD lesion reducing it from 95% to 50% -with inability to pass a stent beyond the bend point at the septal perforator just proximal to this lesion.  - Follow renal function closlely following procedure.  2. CAD s/p CABG in 1990 - continue DAPT one year and statin.  3. HTN - BP borderline; ARB and  beta blocker on hold  4. Former smoker  5. Delirium   - Haldol   - MRI brain normal Will continue to follow. Appreciate TRH.   SKAINS, Needles 06/26/2014, 11:13 AM

## 2014-06-26 NOTE — Plan of Care (Signed)
Problem: Consults Goal: [facility] Patient Education (See Patient Education module for education specifics.)  Outcome: Completed/Met Date Met:  06/26/14 Goal: Skin Care Protocol Initiated - if Braden Score 18 or less If consults are not indicated, leave blank or document N/A  Outcome: Completed/Met Date Met:  06/26/14 Goal: Tobacco Cessation referral if indicated Outcome: Not Applicable Date Met:  86/16/83 Goal: Nutrition Consult-if indicated Outcome: Completed/Met Date Met:  06/26/14 Goal: Diabetes Guidelines if Diabetic/Glucose > 140 If diabetic or lab glucose is > 140 mg/dl - Initiate Diabetes/Hyperglycemia Guidelines & Document Interventions  Outcome: Not Applicable Date Met:  72/90/21  Problem: Phase I Progression Outcomes Goal: Hemodynamically stable Outcome: Completed/Met Date Met:  06/26/14 Goal: Vascular site scale level 0 - I Vascular Site Scale Level 0: No bruising/bleeding/hematoma Level I (Mild): Bruising/Ecchymosis, minimal bleeding/ooozing, palpable hematoma < 3 cm Level II (Moderate): Bleeding not affecting hemodynamic parameters, pseudoaneurysm, palpable hematoma > 3 cm Level III (Severe) Bleeding which affects hemodynamic parameters or retroperitoneal hemorrhage  Outcome: Completed/Met Date Met:  06/26/14 Goal: Initial discharge plan identified Outcome: Completed/Met Date Met:  06/26/14 Goal: Other Phase I Outcomes/Goals Outcome: Not Applicable Date Met:  11/55/20

## 2014-06-26 NOTE — Progress Notes (Signed)
[facility] PROGRESS NOTE  Henry Curtis QPY:[id number] DOB: 1921-11-16 DOA: 06/23/2014 PCP: Tommy Medal, MD  Brief narrative 80 year old male with history of coronary artery disease status post CABG, hypertension, was brought to the ED by his daughter after he was found to be more confused and had not gotten out of bed for past 2 days as per his daughter. His daughter lives in [place] and contacts patient daily. At baseline patient lives alone and is fairly independent. 2-D prior to admission patient's daughter did not hear back from him. When she called him the next day he seemed confused. She was concerned that he was not feeling well and drove here. Patient was taken to PCP office where UA was suggestive of a UTI and EKG showed ST depression and he was referred to the ED. In the ED patient was confused. A CT angiogram of the chest was negative for PE. Patient's EKG showed ST depression and troponin was elevated. Patient placed on IV heparin drip and cardiology consulted. Patient taken to cardiac cath on 12/4 showing severe multivessel disease s/p DES placement. Post procedure pt was combative and very confused with urinary retention.  Assessment/Plan: NSTEMI ST depression changes on EKG and progressively elevated troponins ( peaked at 6.91). Patient placed on full dose aspirin, IV heparin and statin. Held beta blocker due to low blood pressure.  Cardiac cath done  shows severe multivessel CAD with ostial occlusion of the LAD, mid occlusion of the RCA, proximal occlusion of the SVG-RPDA, mid occlusion of the circumflex after it gives off OM1. Laterals which fills a bifurcation point into OM 2 and OM 3. Pt underwent DES placement over mid LAD and PTCA of distal lesion. Needs dual antiplatelet for at least a year Pt wheezy after procedure and given 20 mg IV lasix.   Acute encephalopathy Patient became acutely confused post procedure in the holding area. He was combative and had  urinary retention. Unsuccessful Foley placement by nurses and urology was consulted who placed in a 62 French catheter which drained blood mixed urine.  Patient has been confused and agitated overnight widening IV Haldol alternating with IV Ativan. Safety sitter at bedside. Will recheck UA and chest x-ray. MRI brain done on 12/3 for acute encephalopathy on presentation was unremarkable.  Urinary retention As outlined above, following cardiac cath. As history of prostate CA. Check renal ultrasound.  ?UTI On empiric Rocephin. Follow urine culture   Acute kidney injury On admission now resolved. ARB on hold.  Hypokalemia Replenished  DVT prophylaxis: Subcutaneous Lovenox  Diet: Nothing by mouth for now until more awake and oriented    Consultants:  Cardiology  Procedures:  2-D echo  Cardiac cath on 12/4  Antibiotics:  Rocephin    Code Status: full code Family Communication: None at bedside today Disposition Plan: monitor in stepdown for now. Will need skilled nursing facility once stable   Consultants:  cardiology  Procedures:  Cardiac cath  Antibiotics:  IV rocephin  HPI/Subjective: Since seen and examined. Reportedly was agitated overnight requiting restrains and Air cabin crew. Foley placed by urology yesterday evening with good urine output.  Objective: Filed Vitals:   06/26/14 0900  BP: 100/52  Pulse: 69  Temp:   Resp: 27    Intake/Output Summary (Last 24 hours) at 06/26/14 1051 Last data filed at 06/26/14 0700  Gross per 24 hour  Intake 2887.5 ml  Output   1900 ml  Net  987.5 ml   Filed Weights   06/23/14 2149 06/24/14  2376 06/26/14 0502  Weight: 76.159 kg (167 lb 14.4 oz) 75.932 kg (167 lb 6.4 oz) 76.8 kg (169 lb 5 oz)    Exam:   General:  Elderly male lying in bed somnolent  Cardiovascular: Normal S1-S2, no murmurs  Respiratory: Diminished breath sounds over lung bases, no rhonchi, crackles or wheeze  Abdomen: Off,  nondistended, nontender, bowel sounds present, 40 in place  Musculoskeletal: Warm, no edema  CNS: Somnolent and irritable on attempt to arouse, very confused. Moving all extremities  Data Reviewed: Basic Metabolic Panel:  Recent Labs Lab 06/23/14 1800 06/24/14 0406 06/25/14 0342 06/26/14 0321  NA 139 137 135* 137  K 4.0 3.3* 3.9 3.2*  CL 102 104 102 101  CO2 24 23 20 20   GLUCOSE 104* 101* 110* 116*  BUN 34* 27* 22 18  CREATININE 1.41* 1.07 1.07 1.14  CALCIUM 9.0 8.6 8.3* 8.7  MG  --   --   --  1.9   Liver Function Tests:  Recent Labs Lab 06/23/14 1800 06/24/14 0406  AST 42* 28  ALT 19 15  ALKPHOS 60 52  BILITOT 0.4 0.8  PROT 7.0 6.0  ALBUMIN 3.2* 2.6*   No results for input(s): LIPASE, AMYLASE in the last 168 hours. No results for input(s): AMMONIA in the last 168 hours. CBC:  Recent Labs Lab 06/23/14 1800 06/24/14 0406 06/25/14 0342 06/26/14 0321  WBC 10.3 6.9 7.6 11.1*  NEUTROABS 6.5 3.9  --   --   HGB 12.4* 11.3* 11.5* 11.7*  HCT 36.4* 33.0* 33.2* 32.8*  MCV 94.3 93.5 93.5 94.3  PLT 159 153 168 179   Cardiac Enzymes:  Recent Labs Lab 06/23/14 2315 06/24/14 0406 06/24/14 1027 06/24/14 1646 06/24/14 2140  CKTOTAL  --   --  101  --   --   CKMB  --   --  4.2*  --   --   TROPONINI 6.91* 6.24* 4.69* 2.91* 3.72*   BNP (last 3 results) No results for input(s): PROBNP in the last 8760 hours. CBG:  Recent Labs Lab 06/24/14 2103 06/25/14 0013 06/25/14 0407 06/25/14 0803 06/26/14 0732  GLUCAP 110* 120* 104* 110* 95    Recent Results (from the past 240 hour(s))  Urine culture     Status: None   Collection Time: 06/23/14  6:48 PM  Result Value Ref Range Status   Specimen Description URINE, CLEAN CATCH  Final   Special Requests NONE  Final   Culture  Setup Time   Final    06/24/2014 01:10 Performed at [facility] Performed at [facility]   Final   Culture NO GROWTH Performed at [facility]   Final   Report Status 06/24/2014 FINAL  Final  MRSA PCR Screening     Status: None   Collection Time: 06/26/14  5:02 AM  Result Value Ref Range Status   MRSA by PCR NEGATIVE NEGATIVE Final    Comment:        The GeneXpert MRSA Assay (FDA approved for NASAL specimens only), is one component of a comprehensive MRSA colonization surveillance program. It is not intended to diagnose MRSA infection nor to guide or monitor treatment for MRSA infections.      Studies: Mr Herby Abraham Contrast  06/24/2014   CLINICAL DATA:  80 year old male with acute encephalopathy, found confused. Initial encounter.  EXAM: MRI HEAD WITHOUT CONTRAST  TECHNIQUE: Multiplanar, multiecho pulse sequences of the brain and surrounding structures  were obtained without intravenous contrast.  COMPARISON:  Head CT without contrast 06/23/2014.  FINDINGS: Cerebral volume is within normal limits for age. No restricted diffusion to suggest acute infarction. No midline shift, mass effect, evidence of mass lesion, ventriculomegaly, extra-axial collection or acute intracranial hemorrhage. Cervicomedullary junction and pituitary are within normal limits. Negative visualized cervical spine. Major intracranial vascular flow voids are preserved.  Mild for age nonspecific mostly periventricular cerebral white matter T2 and FLAIR hyperintensity. No cortical encephalomalacia. There is a chronic lacunar infarct in the left thalamus. Other deep gray matter nuclei, the brainstem, and cerebellum are within normal limits for age. Visible internal auditory structures appear normal.  Trace left mastoid fluid. Negative visualized nasopharynx. Trace paranasal sinus mucosal thickening. Postoperative changes to both globes. Visualized scalp soft tissues are within normal limits. Normal bone marrow signal.  IMPRESSION: 1.  No acute intracranial abnormality. 2. Mild for age chronic small vessel disease.   Electronically Signed   By: Lars Pinks M.D.    On: 06/24/2014 16:29    Scheduled Meds: . aspirin EC  325 mg Oral Daily  . atorvastatin  80 mg Oral q1800  . cefTRIAXone (ROCEPHIN)  IV  1 g Intravenous QHS  . clopidogrel  75 mg Oral Q breakfast  . feeding supplement (ENSURE COMPLETE)  237 mL Oral Q1500  . furosemide  20 mg Oral BID  . heparin  5,000 Units Subcutaneous 3 times per day  . potassium chloride      . sodium chloride  3 mL Intravenous Q12H  . sodium chloride  3 mL Intravenous Q12H   Continuous Infusions:    Time spent: 35 minutes    Brena Windsor, [place]  [facility] Pager [phone] If 7PM-7AM, please contact night-coverage at [email or web address], password [facility] 06/26/2014, 10:51 AM  LOS: 3 days

## 2014-06-27 DIAGNOSIS — N3001 Acute cystitis with hematuria: Secondary | ICD-10-CM

## 2014-06-27 LAB — GLUCOSE, CAPILLARY
GLUCOSE-CAPILLARY: 106 mg/dL — AB (ref 70–99)
GLUCOSE-CAPILLARY: 115 mg/dL — AB (ref 70–99)
Glucose-Capillary: 110 mg/dL — ABNORMAL HIGH (ref 70–99)
Glucose-Capillary: 129 mg/dL — ABNORMAL HIGH (ref 70–99)

## 2014-06-27 LAB — BASIC METABOLIC PANEL
Anion gap: 14 (ref 5–15)
BUN: 19 mg/dL (ref 6–23)
CALCIUM: 8.7 mg/dL (ref 8.4–10.5)
CO2: 21 meq/L (ref 19–32)
CREATININE: 1.12 mg/dL (ref 0.50–1.35)
Chloride: 105 mEq/L (ref 96–112)
GFR calc Af Amer: 65 mL/min — ABNORMAL LOW (ref >90)
GFR, EST NON AFRICAN AMERICAN: 56 mL/min — AB (ref >90)
Glucose, Bld: 105 mg/dL — ABNORMAL HIGH (ref 70–99)
Potassium: 3.5 mEq/L — ABNORMAL LOW (ref 3.7–5.3)
SODIUM: 140 meq/L (ref 137–147)

## 2014-06-27 LAB — CBC
HCT: 33.5 % — ABNORMAL LOW (ref 39.0–52.0)
Hemoglobin: 11.8 g/dL — ABNORMAL LOW (ref 13.0–17.0)
MCH: 33.7 pg (ref 26.0–34.0)
MCHC: 35.2 g/dL (ref 30.0–36.0)
MCV: 95.7 fL (ref 78.0–100.0)
Platelets: 194 10*3/uL (ref 150–400)
RBC: 3.5 MIL/uL — AB (ref 4.22–5.81)
RDW: 12.8 % (ref 11.5–15.5)
WBC: 8.8 10*3/uL (ref 4.0–10.5)

## 2014-06-27 MED ORDER — POTASSIUM CHLORIDE CRYS ER 20 MEQ PO TBCR
40.0000 meq | EXTENDED_RELEASE_TABLET | Freq: Once | ORAL | Status: AC
  Administered 2014-06-27: 40 meq via ORAL
  Filled 2014-06-27: qty 2

## 2014-06-27 NOTE — Progress Notes (Addendum)
Subjective:  Combative yesterday, improved today, much better, eating.    Objective:  Vital Signs in the last 24 hours: Temp:  [97.8 F (36.6 C)-98.9 F (37.2 C)] 98.6 F (37 C) (12/06 0720) Pulse Rate:  [67-92] 67 (12/06 0720) Resp:  [22-32] 23 (12/06 0401) BP: (94-124)/(40-69) 97/40 mmHg (12/06 0720) SpO2:  [98 %-100 %] 99 % (12/06 0720) Weight:  [163 lb 9.3 oz (74.2 kg)] 163 lb 9.3 oz (74.2 kg) (12/06 0500)  Intake/Output from previous day: 12/05 0701 - 12/06 0700 In: 360 [P.O.:60; I.V.:250; IV Piggyback:50] Out: 2250 [Urine:2250]   Physical Exam: General: Well developed, well nourished, sedate. Head:  Normocephalic and atraumatic. Lungs: Clear to auscultation and percussion, upper airway noise. Heart: Normal S1 and S2 occasional ectopy.  No murmur, rubs or gallops.  Abdomen: soft, non-tender, positive bowel sounds. Extremities: No clubbing or cyanosis. No edema. Soft restraints.  Neurologic: Slow to respond to questions. Much improved mentation today.     Lab Results:  Recent Labs  06/26/14 0321 06/27/14 0340  WBC 11.1* 8.8  HGB 11.7* 11.8*  PLT 179 194    Recent Labs  06/26/14 0321 06/27/14 0340  NA 137 140  K 3.2* 3.5*  CL 101 105  CO2 20 21  GLUCOSE 116* 105*  BUN 18 19  CREATININE 1.14 1.12    Recent Labs  06/24/14 1646 06/24/14 2140  TROPONINI 2.91* 3.72*   Hepatic Function Panel No results for input(s): PROT, ALBUMIN, AST, ALT, ALKPHOS, BILITOT, BILIDIR, IBILI in the last 72 hours. No results for input(s): CHOL in the last 72 hours.   Imaging: Dg Chest Port 1 View  06/26/2014   CLINICAL DATA:  Acute encephalopathy  EXAM: PORTABLE CHEST - 1 VIEW  COMPARISON:  06/23/2014  FINDINGS: New ill-defined hazy perihilar airspace opacities are identified with interstitial Kerley B-lines and trace pleural effusions. Mild cardiomegaly with evidence of CABG reidentified.  IMPRESSION: Interval development of mild pulmonary edema and trace  effusions.   Electronically Signed   By: Conchita Paris M.D.   On: 06/26/2014 14:17    Telemetry: NSR with PAC Personally viewed.   Assessment/Plan:   [name] is an 80 yo man with PMH of hypertension, CAD and previous CABG who presents with daughter with some changes in behavior largely fatigue/weakness and found to have an NSTEMI.   1. NSTEMI - no obvious CP. Came in with AMS, fatigue and weakness - Troponin trending down (peaked 6.9); continue DAPT one year and statin; add beta blocker later if BP improves. Echo shows EF 45-50.   - Cath performed on 06/25/14 - Successful PCI of the mid LAD 95% stenosis with a Xience Alpine DES stent, but only able to perform PTCA only of the distal LAD lesion reducing it from 95% to 50% -with inability to pass a stent beyond the bend point at the septal perforator just proximal to this lesion.  - Follow renal function closlely following procedure.  2. CAD s/p CABG in 1990 - continue DAPT one year and statin.  3. HTN - BP borderline; ARB and beta blocker on hold  4. Former smoker  5. Delirium   - Haldol   - MRI brain normal  - Improved today Will continue to follow. Appreciate TRH.   OK for transfer to floor  Daughter states that she would like Dr. Stanford Breed to be his cardiologist moving forward. She enjoyed working with him.   Deonne Rooks, [place] 06/27/2014, 9:43 AM

## 2014-06-27 NOTE — Progress Notes (Signed)
[facility] PROGRESS NOTE  TZION WEDEL NWG:[id number] DOB: 19-May-1922 DOA: 06/23/2014 PCP: Tommy Medal, MD Brief narrative 80 year old male with history of coronary artery disease status post CABG, hypertension, was brought to the ED by his daughter after he was found to be more confused and had not gotten out of bed for past 2 days as per his daughter. His daughter lives in [place] and contacts patient daily. At baseline patient lives alone and is fairly independent. 2-D prior to admission patient's daughter did not hear back from him. When she called him the next day he seemed confused. She was concerned that he was not feeling well and drove here. Patient was taken to PCP office where UA was suggestive of a UTI and EKG showed ST depression and he was referred to the ED. In the ED patient was confused. A CT angiogram of the chest was negative for PE. Patient's EKG showed ST depression and troponin was elevated. Patient placed on IV heparin drip and cardiology consulted. Patient taken to cardiac cath on 12/4 showing severe multivessel disease s/p DES placement. Post procedure pt was combative and very confused with urinary retention. transferred to stepdown nit for close monitoring.    Assessment/Plan: NSTEMI ST depression changes on EKG and progressively elevated troponins ( peaked at 6.91). Patient placed on full dose aspirin, IV heparin and statin. Held beta blocker due to low blood pressure.  Cardiac cath done shows severe multivessel CAD with ostial occlusion of the LAD, mid occlusion of the RCA, proximal occlusion of the SVG-RPDA, mid occlusion of the circumflex after it gives off OM1. Laterals which fills a bifurcation point into OM 2 and OM 3. Pt underwent DES placement over mid LAD and PTCA of distal lesion. Needs dual antiplatelet for at least a year   Acute encephalopathy Post cardiac  cath likely due to anesthesia and acute urinary retention. urology placed in a  14 French catheter which drained blood mixed urine.  Patient remained confused and agitated for another day requiring  IV Haldol alternating with IV Ativan as well as Air cabin crew at bedside. Repeat UA showing hematuria, cx negative .  chest x-ray with mild pulmonary edema. Marland Kitchen MRI brain done on 12/3 for acute encephalopathy on presentation was unremarkable. -patient  back to baseline  Urinary retention As outlined above, following cardiac cath. Has history of prostate CA. Follows with Dr Beatrix Fetters. Will d/c on foley and make outpt follow up appt  ?UTI On empiric Rocephin. urine culture negative. Will d/c after 5 days ( tomorrow)   Acute kidney injury On admission now resolved. ARB on hold.  Hypokalemia Replenished  Mild pulmonary edema  on bid lasix. Clinically improved. EF of 45-50% on echo  Weakness and deconditioning  PT eval. Pt lives alone and will benefit from SNF. SW consulted   DVT prophylaxis: Subcutaneous Lovenox  Diet: cardiac    Consultants:  Cardiology  Procedures:  2-D echo  Cardiac cath on 12/4  Antibiotics:  Rocephin    Code Status: full code Family Communication: daughter  at bedside  Disposition Plan: monitor in stepdown for now. Will need skilled nursing facility once stable   Consultants:  cardiology  Procedures:  Cardiac cath  Antibiotics:  IV rocephin  HPI/Subjective: Since seen and examined. Reportedly was agitated overnight requiting restrains and Air cabin crew. Foley placed by urology yesterday evening with good urine output.  Objective: Filed Vitals:   06/27/14 0720  BP: 97/40  Pulse: 67  Temp: 98.6 F (37 C)  Resp:     Intake/Output Summary (Last 24 hours) at 06/27/14 0908 Last data filed at 06/27/14 0853  Gross per 24 hour  Intake    320 ml  Output   2140 ml  Net  -1820 ml   Filed Weights   06/24/14 0428 06/26/14 0502 06/27/14 0500  Weight: 75.932 kg (167 lb 6.4 oz) 76.8 kg (169 lb 5 oz) 74.2 kg (163 lb  9.3 oz)    Exam:   General: Elderly male in NAD, hard of hearing  Cardiovascular: Normal S1-S2, no murmurs  Respiratory: minimal lung base crackles, no rhonchi or wheeze  Abdomen: soft, nondistended, nontender, foley  in place  Musculoskeletal: Warm, no edema  CNS: alert and oriented  Data Reviewed: Basic Metabolic Panel:  Recent Labs Lab 06/23/14 1800 06/24/14 0406 06/25/14 0342 06/26/14 0321 06/27/14 0340  NA 139 137 135* 137 140  K 4.0 3.3* 3.9 3.2* 3.5*  CL 102 104 102 101 105  CO2 24 23 20 20 21   GLUCOSE 104* 101* 110* 116* 105*  BUN 34* 27* 22 18 19   CREATININE 1.41* 1.07 1.07 1.14 1.12  CALCIUM 9.0 8.6 8.3* 8.7 8.7  MG  --   --   --  1.9  --    Liver Function Tests:  Recent Labs Lab 06/23/14 1800 06/24/14 0406  AST 42* 28  ALT 19 15  ALKPHOS 60 52  BILITOT 0.4 0.8  PROT 7.0 6.0  ALBUMIN 3.2* 2.6*   No results for input(s): LIPASE, AMYLASE in the last 168 hours. No results for input(s): AMMONIA in the last 168 hours. CBC:  Recent Labs Lab 06/23/14 1800 06/24/14 0406 06/25/14 0342 06/26/14 0321 06/27/14 0340  WBC 10.3 6.9 7.6 11.1* 8.8  NEUTROABS 6.5 3.9  --   --   --   HGB 12.4* 11.3* 11.5* 11.7* 11.8*  HCT 36.4* 33.0* 33.2* 32.8* 33.5*  MCV 94.3 93.5 93.5 94.3 95.7  PLT 159 153 168 179 194   Cardiac Enzymes:  Recent Labs Lab 06/23/14 2315 06/24/14 0406 06/24/14 1027 06/24/14 1646 06/24/14 2140  CKTOTAL  --   --  101  --   --   CKMB  --   --  4.2*  --   --   TROPONINI 6.91* 6.24* 4.69* 2.91* 3.72*   BNP (last 3 results) No results for input(s): PROBNP in the last 8760 hours. CBG:  Recent Labs Lab 06/26/14 0732 06/26/14 1227 06/26/14 1556 06/26/14 2140 06/27/14 0722  GLUCAP 95 114* 95 118* 106*    Recent Results (from the past 240 hour(s))  Urine culture     Status: None   Collection Time: 06/23/14  6:48 PM  Result Value Ref Range Status   Specimen Description URINE, CLEAN CATCH  Final   Special Requests NONE   Final   Culture  Setup Time   Final    06/24/2014 01:10 Performed at [place] Performed at [facility]   Final   Culture NO GROWTH Performed at [facility]   Final   Report Status 06/24/2014 FINAL  Final  MRSA PCR Screening     Status: None   Collection Time: 06/26/14  5:02 AM  Result Value Ref Range Status   MRSA by PCR NEGATIVE NEGATIVE Final    Comment:        The GeneXpert MRSA Assay (FDA approved for NASAL specimens only), is one component of a comprehensive MRSA colonization surveillance program. It  is not intended to diagnose MRSA infection nor to guide or monitor treatment for MRSA infections.      Studies: Dg Chest Port 1 View  06/26/2014   CLINICAL DATA:  Acute encephalopathy  EXAM: PORTABLE CHEST - 1 VIEW  COMPARISON:  06/23/2014  FINDINGS: New ill-defined hazy perihilar airspace opacities are identified with interstitial Kerley B-lines and trace pleural effusions. Mild cardiomegaly with evidence of CABG reidentified.  IMPRESSION: Interval development of mild pulmonary edema and trace effusions.   Electronically Signed   By: Conchita Paris M.D.   On: 06/26/2014 14:17    Scheduled Meds: . aspirin EC  325 mg Oral Daily  . atorvastatin  80 mg Oral q1800  . cefTRIAXone (ROCEPHIN)  IV  1 g Intravenous QHS  . clopidogrel  75 mg Oral Q breakfast  . feeding supplement (ENSURE COMPLETE)  237 mL Oral Q1500  . furosemide  20 mg Oral BID  . heparin  5,000 Units Subcutaneous 3 times per day  . sodium chloride  3 mL Intravenous Q12H   Continuous Infusions:    Time spent: 25 minutes    Rasha Ibe, [place]  [facility] Pager [phone]. If 7PM-7AM, please contact night-coverage at [email or web address], password [facility] 06/27/2014, 9:08 AM  LOS: 4 days

## 2014-06-27 NOTE — Progress Notes (Signed)
Report called to Mickel Baas( receiving  Nurse on unit 3W . Pt Pt and sister aware of pending tx to rm 3W24. Pt alert ,cooperative .Answers appropriately  to simple questions .Good eye contact . Eating and drinking po fluids w/o diff .MAEW in bed.

## 2014-06-28 ENCOUNTER — Ambulatory Visit: Payer: Medicare Other | Admitting: Podiatry

## 2014-06-28 DIAGNOSIS — A047 Enterocolitis due to Clostridium difficile: Secondary | ICD-10-CM

## 2014-06-28 DIAGNOSIS — A048 Other specified bacterial intestinal infections: Secondary | ICD-10-CM | POA: Diagnosis not present

## 2014-06-28 LAB — GLUCOSE, CAPILLARY
GLUCOSE-CAPILLARY: 133 mg/dL — AB (ref 70–99)
GLUCOSE-CAPILLARY: 134 mg/dL — AB (ref 70–99)
GLUCOSE-CAPILLARY: 125 mg/dL — AB (ref 70–99)
Glucose-Capillary: 129 mg/dL — ABNORMAL HIGH (ref 70–99)
Glucose-Capillary: 107 mg/dL — ABNORMAL HIGH (ref 70–99)

## 2014-06-28 LAB — CLOSTRIDIUM DIFFICILE BY PCR: Toxigenic C. Difficile by PCR: POSITIVE — AB

## 2014-06-28 MED ORDER — METRONIDAZOLE 500 MG PO TABS
500.0000 mg | ORAL_TABLET | Freq: Three times a day (TID) | ORAL | Status: DC
  Administered 2014-06-28 – 2014-06-29 (×3): 500 mg via ORAL
  Filled 2014-06-28 (×3): qty 1

## 2014-06-28 MED ORDER — SACCHAROMYCES BOULARDII 250 MG PO CAPS
250.0000 mg | ORAL_CAPSULE | Freq: Two times a day (BID) | ORAL | Status: DC
  Administered 2014-06-28 – 2014-06-29 (×3): 250 mg via ORAL
  Filled 2014-06-28 (×3): qty 1

## 2014-06-28 NOTE — Clinical Social Work Placement (Addendum)
    Clinical Social Work Department CLINICAL SOCIAL WORK PLACEMENT NOTE 06/28/2014  Patient:  Henry Curtis, Henry Curtis  Account Number:  [id number] Admit date:  06/23/2014  Clinical Social Worker:  Berton Mount, Latanya Presser  Date/time:  06/28/2014 10:51 AM  Clinical Social Work is seeking post-discharge placement for this patient at the following level of care:   SKILLED NURSING   (*CSW will update this form in Epic as items are completed)   06/28/2014  Patient/family provided with [facility] Department of Clinical Social Work's list of facilities offering this level of care within the geographic area requested by the patient (or if unable, by the patient's family).  06/28/2014  Patient/family informed of their freedom to choose among providers that offer the needed level of care, that participate in Medicare, Medicaid or managed care program needed by the patient, have an available bed and are willing to accept the patient.  06/28/2014  Patient/family informed of [facility]' ownership interest in [facility], as well as of the fact that they are under no obligation to receive care at this facility.  PASARR submitted to EDS on 06/28/2014 PASARR number received on 06/28/2014  FL2 transmitted to all facilities in geographic area requested by pt/family on  06/28/2014 FL2 transmitted to all facilities within larger geographic area on   Patient informed that his/her managed care company has contracts with or will negotiate with  certain facilities, including the following:     Patient/family informed of bed offers received:  06/28/2014 Patient chooses bed at [facility] Physician recommends and patient chooses bed at    Patient to be transferred [facility]  on  06/29/2014 Patient to be transferred to facility by [facility] Patient and family notified of transfer on 06/29/2014 Name of family member notified:  Joella Prince  The following physician request were entered in Epic: Physician  Request  Please sign FL2.    Additional CommentsBerton Mount, [place]

## 2014-06-28 NOTE — Clinical Social Work Psychosocial (Signed)
Clinical Social Work Department BRIEF PSYCHOSOCIAL ASSESSMENT 06/28/2014  Patient:  Henry Curtis, Henry Curtis     Account Number:  [id number]     Admit date:  06/23/2014  Clinical Social Worker:  Adair Laundry  Date/Time:  06/28/2014 10:31 AM  Referred by:  Physician  Date Referred:  06/28/2014 Referred for  SNF Placement   Other Referral:   Interview type:  Family Other interview type:   Spoke with pt and pt daughter at bedside    PSYCHOSOCIAL DATA Living Status:  ALONE Admitted from facility:   Level of care:   Primary support name:  Henry Curtis [phone] Primary support relationship to patient:  CHILD, ADULT Degree of support available:   Pt has supportive family    CURRENT CONCERNS Current Concerns  Post-Acute Placement   Other Concerns:    SOCIAL WORK ASSESSMENT / PLAN CSW made aware that pt will likely need ST rehab at dc and family requesting to speak with CSW. CSW met with pt and pt daughter at bedside. Pt oriented and did acknowledge CSW but limited participation in conversation. Pt daughter informed CSW she is meeting with the [facility] today to see how they can assist, but she is aware that placement with the [facility] can take time and she would like to proceed with using pt Medicare benefits. CSW explained SNF referral process and as well as insurance authorization process to pt daughter. She was understanding and agreeable to referral being sent to all [facility]. She did express a preference in [place] if they accept pt insurance. Pt expressed to CSW he is agreeable to ST rehab and would like CSW to continue to communicate with his daughter to find a suitable facility. Pt had no concerns. Pt daughter informed CSW her concerns were about not knowing the process but she appreciate CSW information. CSW to send referral but awaiting PT evaluation to be able to submit for insurance authorization.   Assessment/plan status:  Psychosocial Support/Ongoing  Assessment of Needs Other assessment/ plan:   Information/referral to community resources:   SNF list to be provided with bed offers    PATIENTS/FAMILYS RESPONSE TO PLAN OF CARE: Pt and pt daughter pleasant and cooperative. Both are agreeable to ST rehab at [facility].    [place], [place]

## 2014-06-28 NOTE — Plan of Care (Signed)
Problem: Phase III Progression Outcomes Goal: Vascular site scale level 0 - I Vascular Site Scale Level 0: No bruising/bleeding/hematoma Level I (Mild): Bruising/Ecchymosis, minimal bleeding/ooozing, palpable hematoma < 3 cm Level II (Moderate): Bleeding not affecting hemodynamic parameters, pseudoaneurysm, palpable hematoma > 3 cm Level III (Severe) Bleeding which affects hemodynamic parameters or retroperitoneal hemorrhage  Outcome: Completed/Met Date Met:  06/28/14

## 2014-06-28 NOTE — Care Management Note (Signed)
    Page 1 of 1   06/28/2014     10:35:19 AM CARE MANAGEMENT NOTE 06/28/2014  Patient:  Henry Curtis, Henry Curtis   Account Number:  [id number]  Date Initiated:  06/28/2014  Documentation initiated by:  GRAVES-BIGELOW,Everest Hacking  Subjective/Objective Assessment:   Pt admitted for Nstemi. S/p cath. Plan    for d/c to SNF once medically stable.     Action/Plan:   CSW to assist with disposition needs.   Anticipated DC Date:  06/29/2014   Anticipated DC Plan:  SKILLED NURSING FACILITY  In-house referral  Clinical Social Worker      DC Planning Services  CM consult      Choice offered to / List presented to:             Status of service:  Completed, signed off Medicare Important Message given?  YES (If response is "NO", the following Medicare IM given date fields will be blank) Date Medicare IM given:  06/28/2014 Medicare IM given by:  GRAVES-BIGELOW,Maisy [place] Date Additional Medicare IM given:   Additional Medicare IM given by:    Discharge Disposition:  [place]  Per UR Regulation:  Reviewed for med. necessity/level of care/duration of stay  If discussed at [place] of Stay Meetings, dates discussed:    Comments:

## 2014-06-28 NOTE — Progress Notes (Signed)
CARDIAC REHAB PHASE I   PRE:  Rate/Rhythm: 66 SR  BP:  Supine: 99/48  Sitting: 98/50  Standing:    SaO2: 100 2L 98 RA  MODE:  Ambulation: 70 ft   POST:  Rate/Rhythm: 88 SR with PAC's  BP:  Supine:   Sitting: 101/53  Standing:    SaO2: 96 RA [id number] On arrival pt in bed sleeping with breakfast tray uneaten. Pt aroused easily. Sat him on side of bed and stood pt. He c/o of need to have BM. We were unable to get pt to [facility] before he was incontinent. Pt had large dark brown gel stool. Pt cleaned up and he had more BM on bedpan. Assisted X 2 used walker and gait belt to ambulate. Pt weak with standing and leans forward. He needs constant reminders to for upright posture. Pt was able to walk 70 feet. He was exhausted by end of walk. Pt to recliner after walk with call light in reach, chair alarm on and daughter in room. Reported to RN.    Rodney Langton RN 06/28/2014 9:09 AM

## 2014-06-28 NOTE — Evaluation (Signed)
Physical Therapy Evaluation Patient Details Name: Henry Curtis MRN: [id number] DOB: 1922-06-24 Today's Date: 06/28/2014   History of Present Illness    80 year old male with history of coronary artery disease status post CABG, hypertension, was brought to the ED by his daughter after he was found to be more confused and had not gotten out of bed for past 2 days as per his daughter. His daughter lives in [place] and contacts patient daily. At baseline patient lives alone and is fairly independent. 2-D prior to admission patient's daughter did not hear back from him. When she called him the next day he seemed confused. She was concerned that he was not feeling well and drove here. Patient was taken to PCP office where UA was suggestive of a UTI and EKG showed ST depression and he was referred to the ED. In the ED patient was confused. A CT angiogram of the chest was negative for PE. Patient's EKG showed ST depression and troponin was elevated. Patient placed on IV heparin drip and cardiology consulted.   Clinical Impression  Pt currently with functional limitations due to decreased mobility, decreased balance, and decreased endurance. Pt will benefit from skilled PT to increase independence and safety with mobility to allow discharge to SNF. Pt's daughter concerned about pt going back home alone after discharge for hospital. Daughter stated pt would forget to eat and drink at home. Pt and daughter agreeable to SNF to get strength and endurance back before going home. Pt required min assist for transfer from recliner and ambulation using RW. Pt needed cuing for handling RW and safe transfer hand placements.       Follow Up Recommendations SNF;Supervision for mobility/OOB    Equipment Recommendations  Other (comment) (TBA at next venue)    Recommendations for Other Services       Precautions / Restrictions Precautions Precautions: Fall Restrictions Weight Bearing Restrictions: No      Mobility  Bed Mobility               General bed mobility comments: Pt sitting in chair at start of session.   Transfers Overall transfer level: Needs assistance Equipment used: Rolling walker (2 wheeled) Transfers: Sit to/from Stand Sit to Stand: Min assist         General transfer comment: Pt requires min assist for physical boost to standing and required cuing for hand placement on chair when pushing to stand.   Ambulation/Gait Ambulation/Gait assistance: Min assist Ambulation Distance (Feet): 25 Feet   Gait Pattern/deviations: Shuffle;Decreased stride length;Step-through pattern;Trunk flexed Gait velocity: Decreased Gait velocity interpretation: Below normal speed for age/gender General Gait Details: Pt required heavy verbal cuing for use of RW and for posture during ambulation. Pt had tendency to push walker out too far from his body and was not centered in walker with ambulation. Pt was hunched over with ambulation and required cuing to stand up straight. Pt's daughter said this is new since being in the hospital. Pt had one instance of loss of balance and was able to self-correct.   Stairs            Wheelchair Mobility    Modified Rankin (Stroke Patients Only)       Balance Overall balance assessment: Needs assistance         Standing balance support: During functional activity;Bilateral upper extremity supported Standing balance-Leahy Scale: Poor Standing balance comment: Pt required support of RW to maintain static standing balance and during ambulation  Pertinent Vitals/Pain Pain Assessment: No/denies pain    Home Living Family/patient expects to be discharged to:: Private residence Living Arrangements: Alone   Type of Home: House Home Access: Stairs to enter Entrance Stairs-Rails: Right;Left;Can reach both Entrance Stairs-Number of Steps: 4 Home Layout: Two level;Able to live on main level with  bedroom/bathroom Home Equipment: Kasandra Knudsen - single point Additional Comments: Pt lives by himself. Pt's daughter stated she lives in [place], [place] and is unable to stay with pt at discharge.    Prior Function Level of Independence: Independent               Hand Dominance        Extremity/Trunk Assessment               Lower Extremity Assessment: Overall WFL for tasks assessed         Communication   Communication: No difficulties  Cognition Arousal/Alertness: Awake/alert Behavior During Therapy: WFL for tasks assessed/performed;Flat affect                        General Comments General comments (skin integrity, edema, etc.): Pt's daughter had questions about discharge plan and amount of care at discharge as she lives in [place], [place] and will not be able to care for pt at discharge.     Exercises        Assessment/Plan    PT Assessment Patient needs continued PT services  PT Diagnosis Difficulty walking   PT Problem List Decreased strength;Decreased activity tolerance;Decreased balance;Decreased mobility;Decreased knowledge of use of DME  PT Treatment Interventions DME instruction;Gait training;Stair training;Functional mobility training;Therapeutic activities;Therapeutic exercise;Balance training;Patient/family education   PT Goals (Current goals can be found in the Care Plan section) Acute Rehab PT Goals Patient Stated Goal: none stated PT Goal Formulation: With patient/family Time For Goal Achievement: 07/12/14 Potential to Achieve Goals: Good    Frequency Min 3X/week   Barriers to discharge Other (comment) (Pt lives alone; daughter lives in [place], [place] )      Co-evaluation               End of Session Equipment Utilized During Treatment: Gait belt Activity Tolerance: Patient limited by fatigue Patient left: in chair;with call bell/phone within reach;with family/visitor present           Time: 1143-1203 PT Time Calculation (min)  (ACUTE ONLY): 20 min   Charges:   PT Evaluation $Initial PT Evaluation Tier I: 1 Procedure PT Treatments $Gait Training: 8-22 mins   PT G CodesJearld Shines SPT 06/28/2014, 2:22 PM  Jearld Shines, [place]  Acute Rehabilitation [phone]

## 2014-06-28 NOTE — Progress Notes (Signed)
[facility] PROGRESS NOTE  Henry Curtis HMC:[id number] DOB: 27-Nov-1921 DOA: 06/23/2014 PCP: Tommy Medal, MD  Brief narrative 80 year old male with history of coronary artery disease status post CABG, hypertension, was brought to the ED by his daughter after he was found to be more confused and had not gotten out of bed for past 2 days as per his daughter. His daughter lives in [place] and contacts patient daily. At baseline patient lives alone and is fairly independent. 2-D prior to admission patient's daughter did not hear back from him. When she called him the next day he seemed confused. She was concerned that he was not feeling well and drove here. Patient was taken to PCP office where UA was suggestive of a UTI and EKG showed ST depression and he was referred to the ED. In the ED patient was confused. A CT angiogram of the chest was negative for PE. Patient's EKG showed ST depression and troponin was elevated. Patient placed on IV heparin drip and cardiology consulted. Patient taken to cardiac cath on 12/4 showing severe multivessel disease s/p DES placement. Post procedure pt was combative and very confused with urinary retention. transferred to stepdown nit for close monitoring.    Assessment/Plan: NSTEMI ST depression changes on EKG and progressively elevated troponins ( peaked at 6.91). Patient placed on full dose aspirin, IV heparin and statin. Held beta blocker due to low blood pressure.  Cardiac cath done shows severe multivessel CAD with ostial occlusion of the LAD, mid occlusion of the RCA, proximal occlusion of the SVG-RPDA, mid occlusion of the circumflex after it gives off OM1. Laterals which fills a bifurcation point into OM 2 and OM 3. Pt underwent DES placement over mid LAD and PTCA of distal lesion. Needs dual antiplatelet for at least a year. Follow up with cardiology (Dr. Stanford Breed) as outpatient.   Acute encephalopathy Post cardiac cath likely due to  anesthesia and acute urinary retention. urology placed in a 14 French catheter which drained blood mixed urine.  Patient remained confused and agitated for another day requiring IV Haldol alternating with IV Ativan as well as Air cabin crew at bedside. Repeat UA showing hematuria, cx negative . chest x-ray with mild pulmonary edema. Marland Kitchen MRI brain done on 12/3 for acute encephalopathy on presentation was unremarkable. -Mental status now back to baseline.  Urinary retention As outlined above, following cardiac cath. Has history of prostate CA. Follows with Dr Beatrix Fetters. Will d/c on foley and arrange for outpt follow up with him.  C. difficile diarrhea on 12/6 Started on oral Flagyl. Symptoms improving today.  ?UTI Completed 5 days of IV Rocephin. Cultures negative   Acute kidney injury On admission now resolved. Resume ARB upon discharge  Hypokalemia Replenished  Mild pulmonary edema Proton twice a day Lasix. Currently euvolemic. Will discontinue EF of 45-50% on echo  Weakness and deconditioning PT eval. Pt lives alone and will benefit from SNF. SW consulted   DVT prophylaxis: Subcutaneous Lovenox  Diet: cardiac       Code Status: full code Family Communication: daughter at bedside  Disposition Plan: Skilled nursing facility possibly tomorrow if diarrhea improved   Consultants:  cardiology  Procedures:  Cardiac cath on 12/4  Antibiotics:  IV rocephin 12/2-12/6  Flagyl 12/7--  HPI/Subjective: Since seen and examined. Reportedly was agitated overnight requiting restrains and Air cabin crew. Foley placed by urology yesterday evening with good urine output.  Objective: Filed Vitals:   06/28/14 0447  BP: 104/57  Pulse: 70  Temp: 98.3  F (36.8 C)  Resp: 20    Intake/Output Summary (Last 24 hours) at 06/28/14 1228 Last data filed at 06/28/14 0334  Gross per 24 hour  Intake    240 ml  Output    650 ml  Net   -410 ml   Filed Weights   06/26/14 0502  06/27/14 0500 06/28/14 0447  Weight: 76.8 kg (169 lb 5 oz) 74.2 kg (163 lb 9.3 oz) 74 kg (163 lb 2.3 oz)    Exam:   General: Elderly male in NAD, hard of hearing  Cardiovascular: Normal S1-S2, no murmurs  Respiratory: Clear bilaterally, no rhonchi or wheeze  Abdomen: soft, nondistended, nontender, foley in place  Musculoskeletal: Warm, no edema    Data Reviewed: Basic Metabolic Panel:  Recent Labs Lab 06/23/14 1800 06/24/14 0406 06/25/14 0342 06/26/14 0321 06/27/14 0340  NA 139 137 135* 137 140  K 4.0 3.3* 3.9 3.2* 3.5*  CL 102 104 102 101 105  CO2 24 23 20 20 21   GLUCOSE 104* 101* 110* 116* 105*  BUN 34* 27* 22 18 19   CREATININE 1.41* 1.07 1.07 1.14 1.12  CALCIUM 9.0 8.6 8.3* 8.7 8.7  MG  --   --   --  1.9  --    Liver Function Tests:  Recent Labs Lab 06/23/14 1800 06/24/14 0406  AST 42* 28  ALT 19 15  ALKPHOS 60 52  BILITOT 0.4 0.8  PROT 7.0 6.0  ALBUMIN 3.2* 2.6*   No results for input(s): LIPASE, AMYLASE in the last 168 hours. No results for input(s): AMMONIA in the last 168 hours. CBC:  Recent Labs Lab 06/23/14 1800 06/24/14 0406 06/25/14 0342 06/26/14 0321 06/27/14 0340  WBC 10.3 6.9 7.6 11.1* 8.8  NEUTROABS 6.5 3.9  --   --   --   HGB 12.4* 11.3* 11.5* 11.7* 11.8*  HCT 36.4* 33.0* 33.2* 32.8* 33.5*  MCV 94.3 93.5 93.5 94.3 95.7  PLT 159 153 168 179 194   Cardiac Enzymes:  Recent Labs Lab 06/23/14 2315 06/24/14 0406 06/24/14 1027 06/24/14 1646 06/24/14 2140  CKTOTAL  --   --  101  --   --   CKMB  --   --  4.2*  --   --   TROPONINI 6.91* 6.24* 4.69* 2.91* 3.72*   BNP (last 3 results) No results for input(s): PROBNP in the last 8760 hours. CBG:  Recent Labs Lab 06/27/14 1938 06/28/14 0111 06/28/14 0348 06/28/14 0732 06/28/14 1138  GLUCAP 129* 129* 133* 107* 134*    Recent Results (from the past 240 hour(s))  Urine culture     Status: None   Collection Time: 06/23/14  6:48 PM  Result Value Ref Range Status    Specimen Description URINE, CLEAN CATCH  Final   Special Requests NONE  Final   Culture  Setup Time   Final    06/24/2014 01:10 Performed at [place] Performed at [facility]   Final   Culture NO GROWTH Performed at [facility]   Final   Report Status 06/24/2014 FINAL  Final  MRSA PCR Screening     Status: None   Collection Time: 06/26/14  5:02 AM  Result Value Ref Range Status   MRSA by PCR NEGATIVE NEGATIVE Final    Comment:        The GeneXpert MRSA Assay (FDA approved for NASAL specimens only), is one component of a comprehensive MRSA colonization surveillance program. It  is not intended to diagnose MRSA infection nor to guide or monitor treatment for MRSA infections.   Clostridium Difficile by PCR     Status: Abnormal   Collection Time: 06/28/14  5:24 AM  Result Value Ref Range Status   C difficile by pcr POSITIVE (A) NEGATIVE Final    Comment: CRITICAL RESULT CALLED TO, READ BACK BY AND VERIFIED WITH: [facility] RN @ [phone] 06/28/14 LEONARD,A      Studies: No results found.  Scheduled Meds: . aspirin EC  325 mg Oral Daily  . atorvastatin  80 mg Oral q1800  . clopidogrel  75 mg Oral Q breakfast  . feeding supplement (ENSURE COMPLETE)  237 mL Oral Q1500  . furosemide  20 mg Oral BID  . heparin  5,000 Units Subcutaneous 3 times per day  . metroNIDAZOLE  500 mg Oral 3 times per day  . sodium chloride  3 mL Intravenous Q12H   Continuous Infusions:      Time spent: 25 minutes    Elanore Talcott, [place]  [facility] Pager [phone]. If 7PM-7AM, please contact night-coverage at [email or web address], password [facility] 06/28/2014, 12:28 PM  LOS: 5 days

## 2014-06-29 DIAGNOSIS — A0472 Enterocolitis due to Clostridium difficile, not specified as recurrent: Secondary | ICD-10-CM | POA: Diagnosis not present

## 2014-06-29 DIAGNOSIS — A048 Other specified bacterial intestinal infections: Secondary | ICD-10-CM

## 2014-06-29 LAB — GLUCOSE, CAPILLARY
GLUCOSE-CAPILLARY: 137 mg/dL — AB (ref 70–99)
Glucose-Capillary: 143 mg/dL — ABNORMAL HIGH (ref 70–99)
Glucose-Capillary: 98 mg/dL (ref 70–99)

## 2014-06-29 MED ORDER — METRONIDAZOLE 500 MG PO TABS: 500.0000 mg | ORAL_TABLET | Freq: Three times a day (TID) | ORAL | Status: AC

## 2014-06-29 MED ORDER — ATORVASTATIN CALCIUM 80 MG PO TABS: 80.0000 mg | ORAL_TABLET | Freq: Every day | ORAL | Status: DC

## 2014-06-29 MED ORDER — ASPIRIN 325 MG PO TBEC: 325.0000 mg | DELAYED_RELEASE_TABLET | Freq: Every day | ORAL | Status: DC

## 2014-06-29 MED ORDER — SACCHAROMYCES BOULARDII 250 MG PO CAPS: 250.0000 mg | ORAL_CAPSULE | Freq: Two times a day (BID) | ORAL | Status: DC

## 2014-06-29 MED ORDER — CLOPIDOGREL BISULFATE 75 MG PO TABS: 75.0000 mg | ORAL_TABLET | Freq: Every day | ORAL | Status: DC

## 2014-06-29 MED ORDER — ENSURE COMPLETE PO LIQD: 237.0000 mL | Freq: Every day | ORAL | Status: DC

## 2014-06-29 MED ORDER — METOPROLOL TARTRATE 12.5 MG HALF TABLET: 12.5000 mg | ORAL_TABLET | Freq: Two times a day (BID) | ORAL | Status: DC

## 2014-06-29 MED FILL — Sodium Chloride IV Soln 0.9%: INTRAVENOUS | Qty: 50 | Status: AC

## 2014-06-29 NOTE — Progress Notes (Signed)
Pt lower abd distended tender, foley flushed with 50ml sterile water, 850 ml urine obtained.

## 2014-06-29 NOTE — Progress Notes (Signed)
    Subjective:  No chest pain or shortness of breath. Patient's family at bedside. He complains of some lower abdominal/suprapubic discomfort. Concerned there may be a problem with his Foley catheter.  Objective:  Vital Signs in the last 24 hours: Temp:  [97.3 F (36.3 C)-99 F (37.2 C)] 97.3 F (36.3 C) (12/08 0458) Pulse Rate:  [64-116] 116 (12/08 0458) Resp:  [18-19] 18 (12/08 0458) BP: (86-137)/(49-75) 111/70 mmHg (12/08 0458) SpO2:  [96 %-98 %] 97 % (12/08 0458) Weight:  [162 lb 11.2 oz (73.8 kg)] 162 lb 11.2 oz (73.8 kg) (12/08 0458)  Intake/Output from previous day: 12/07 0701 - 12/08 0700 In: 240 [P.O.:240] Out: 300 [Urine:300]  Physical Exam: Pt is alert and oriented, pleasant elderly man in NAD HEENT: normal Neck: JVP - normal Lungs: CTA bilaterally CV: RRR without murmur or gallop Abd: There is firmness and distention in the suprapubic area, positive bowel sounds Ext: no C/C/E Skin: warm/dry no rash  Lab Results:  Recent Labs  06/27/14 0340  WBC 8.8  HGB 11.8*  PLT 194    Recent Labs  06/27/14 0340  NA 140  K 3.5*  CL 105  CO2 21  GLUCOSE 105*  BUN 19  CREATININE 1.12   No results for input(s): TROPONINI in the last 72 hours.  Invalid input(s): CK, MB  Assessment/Plan:  1. NSTEMI, remote CABG, treated with PCI this admission (drug-eluting stent) 2. HTN 3. Delirium: improved 4. Suprapubic tenderness/bladder distention: per [facility] service 5. Tachycardia: check 12 lead EKG. Add low-dose metoprolol.  Meds reviewed (ASA, plavix, atorvastatin). Will add metoprolol at low dose. Appears stable from a cardiac perspective.  Sherren Mocha, M.D. 06/29/2014, 10:29 AM

## 2014-06-29 NOTE — Discharge Summary (Addendum)
Physician Discharge Summary  Henry Curtis EHM:[id number] DOB: [birth year] DOA: 06/23/2014  PCP: Tommy Medal, MD  Admit date: 06/23/2014 Discharge date: 06/29/2014  Time spent: 35 minutes  Recommendations for Outpatient Follow-up:  1. Discharge to SNF ( [facility]). Has foley for urinary retention. Needs daily flushing to prevent retention or blockage. Follow up with urology arranged. Continue foley until seen by urology 2. Follow up with cardiology in 2 weeks. Office will call for appt 3. Completes 2 weeks course of flagyl on 12/20.  Discharge Diagnoses:  Principle problem NSTEMI  Active Problems:    Acute renal failure   Acute encephalopathy   Essential hypertension   Other specified hypotension   CAD - s/p CABP 12/1989; LIMA-D1-LAD, SVG-rPDA (PDA Endarterectomy & patch andioplasty)   Acute urinary retention   Enteritis due to Clostridium difficile   Discharge Condition: FAIR  Diet recommendation: heart healthy  Filed Weights   06/27/14 0500 06/28/14 0447 06/29/14 0458  Weight: 74.2 kg (163 lb 9.3 oz) 74 kg (163 lb 2.3 oz) 73.8 kg (162 lb 11.2 oz)    History of present illness:  Please refer to admission H&P for details, but in brief, 80 year old male with history of coronary artery disease status post CABG, hypertension, was brought to the ED by his daughter after he was found to be more confused and had not gotten out of bed for past 2 days as per his daughter. His daughter lives in [place] and contacts patient daily. At baseline patient lives alone and is fairly independent. 2-D prior to admission patient's daughter did not hear back from him. When she called him the next day he seemed confused. She was concerned that he was not feeling well and drove here. Patient was taken to PCP office where UA was suggestive of a UTI and EKG showed ST depression and he was referred to the ED. In the ED patient was confused. A CT angiogram of the chest was negative for PE.  Patient's EKG showed ST depression and troponin was elevated. Patient placed on IV heparin drip and cardiology consulted. Patient taken to cardiac cath on 12/4 showing severe multivessel disease s/p DES placement. Post procedure pt was combative and very confused with urinary retention. transferred to stepdown unit for close monitoring.  Hospital Course:  Non ST elevation MI ST depression changes on EKG and progressively elevated troponins ( peaked at 6.91). Patient placed on full dose aspirin, IV heparin and statin. Held beta blocker due to low blood pressure.  Cardiac cath done shows severe multivessel CAD with ostial occlusion of the LAD, mid occlusion of the RCA, proximal occlusion of the SVG-RPDA, mid occlusion of the circumflex after it gives off OM1. Laterals which fills a bifurcation point into OM 2 and OM 3. Pt underwent DES placement over mid LAD and PTCA of distal lesion. Needs dual antiplatelet for at least a year. ( aspirin and plavix) . added low dose metoprolol Follow up with cardiology (Dr. Stanford Breed) as outpatient.   Acute encephalopathy Post cardiac cath likely due to anesthesia and acute urinary retention. urology placed in a 14 French catheter which drained blood mixed urine.  Patient remained confused and agitated for another day requiring IV Haldol alternating with IV Ativan as well as Air cabin crew at bedside. Repeat UA showing hematuria, cx negative . chest x-ray with mild pulmonary edema. Marland Kitchen MRI brain done on 12/3 for acute encephalopathy on presentation was unremarkable. -Mental status now back to baseline.  Acute Urinary retention As outlined above,  following cardiac cath. Has history of prostate CA. noted for urinary retention on foley this am , bladder scan was inconclusive but on flushing with 30 cc sterile saline received 850 cc urine. aptient felt much better. Needs periodic monitoring for urine output and flushing as outpt. Follows with Dr Beatrix Fetters. Will d/c  on foley and arrange for outpt follow up with him.  C. difficile diarrhea on 12/6 Started on oral Flagyl. Had one episode of diarrhea overnight. Stable. Will d/c on 2 weeks course of flagyl and florastar.  ?UTI Completed 5 days of IV Rocephin. Cultures negative   Acute kidney injury On admission now resolved. Resumed ARB upon discharge  Hypokalemia Replenished  Mild pulmonary edema post cardiac cath. Received lasix with improvement.  EF of 45-50% on echo  Weakness and deconditioning PT eval. Pt lives alone and will benefit from SNF.      Diet: cardiac       Code Status: full code Family Communication: daughter at bedside  Disposition Plan: Skilled nursing facility    Consultants:  cardiology  Procedures:  Cardiac cath on 12/4  Foley catheter placed on 12/4  Antibiotics:  IV rocephin 12/2-12/6  Flagyl 12/7--  Discharge Exam: Filed Vitals:   06/29/14 0458  BP: 111/70  Pulse: 116  Temp: 97.3 F (36.3 C)  Resp: 18    General: Elderly male in NAD, hard of hearing  HEENT: no pallor, moist oral mucosa  Cardiovascular: Normal S1-S2, no murmurs  Respiratory: Clear bilaterally, no rhonchi or wheeze  Abdomen: soft, suprapubic distention with tenderness, improved with flushing foley,  foley in place, BS+  Musculoskeletal: Warm, no edema  CNS: alert and  oreinted   Discharge Instructions You were cared for by a hospitalist during your hospital stay. If you have any questions about your discharge medications or the care you received while you were in the hospital after you are discharged, you can call the unit and asked to speak with the hospitalist on call if the hospitalist that took care of you is not available. Once you are discharged, your primary care physician will handle any further medical issues. Please note that NO REFILLS for any discharge medications will be authorized once you are discharged, as it is imperative that you return to  your primary care physician (or establish a relationship with a primary care physician if you do not have one) for your aftercare needs so that they can reassess your need for medications and monitor your lab values.   Current Discharge Medication List    START taking these medications   Details  aspirin EC 325 MG EC tablet Take 1 tablet (325 mg total) by mouth daily. Qty: 30 tablet, Refills: 0    atorvastatin (LIPITOR) 80 MG tablet Take 1 tablet (80 mg total) by mouth daily at 6 PM. Qty: 30 tablet, Refills: 0    clopidogrel (PLAVIX) 75 MG tablet Take 1 tablet (75 mg total) by mouth daily with breakfast. Qty: 30 tablet, Refills: 0    feeding supplement, ENSURE COMPLETE, (ENSURE COMPLETE) LIQD Take 237 mLs by mouth daily at 3 pm. Qty: 30 Bottle, Refills: 0    metoprolol tartrate (LOPRESSOR) 12.5 mg TABS tablet Take 0.5 tablets (12.5 mg total) by mouth 2 (two) times daily. Qty: 60 tablet, Refills: 0    metroNIDAZOLE (FLAGYL) 500 MG tablet Take 1 tablet (500 mg total) by mouth every 8 (eight) hours. Qty: 40 tablet, Refills: 0    saccharomyces boulardii (FLORASTOR) 250 MG capsule Take 1  capsule (250 mg total) by mouth 2 (two) times daily. Qty: 60 capsule, Refills: 0      CONTINUE these medications which have NOT CHANGED   Details  losartan (COZAAR) 100 MG tablet Take 100 mg by mouth daily.       No Known Allergies Follow-up Information    Follow up with [facility], NP On 07/20/2014.   Specialty:  Urology   Why:  Follow up for retention @ 9:15am   Contact information:   [place] [address] [phone]       Follow up with Kirk Ruths, MD In 2 weeks.   Specialty:  Cardiology   Why:  OFFICE WILL CALL   Contact information:   [address] [place] [address] [phone]        The results of significant diagnostics from this hospitalization (including imaging, microbiology, ancillary and laboratory) are listed below for reference.     Significant Diagnostic Studies: Ct Head Wo Contrast  06/23/2014   CLINICAL DATA:  Mental status change.  Confusion.  EXAM: CT HEAD WITHOUT CONTRAST  TECHNIQUE: Contiguous axial images were obtained from the base of the skull through the vertex without intravenous contrast.  COMPARISON:  None.  FINDINGS: Stable age related cerebral atrophy, ventriculomegaly and periventricular white matter disease. No extra-axial fluid collections are identified. No CT findings for acute hemispheric infarction or intracranial hemorrhage. No mass lesions. The brainstem and cerebellum are normal.  The No acute bony findings. The paranasal sinuses and mastoid air cells are clear. The globes are intact.  IMPRESSION: Age related cerebral atrophy, ventriculomegaly periventricular white matter disease. No acute intracranial findings or mass lesions.   Electronically Signed   By: Kalman Jewels M.D.   On: 06/23/2014 19:55   Mr Brain Wo Contrast  06/24/2014   CLINICAL DATA:  80 year old male with acute encephalopathy, found confused. Initial encounter.  EXAM: MRI HEAD WITHOUT CONTRAST  TECHNIQUE: Multiplanar, multiecho pulse sequences of the brain and surrounding structures were obtained without intravenous contrast.  COMPARISON:  Head CT without contrast 06/23/2014.  FINDINGS: Cerebral volume is within normal limits for age. No restricted diffusion to suggest acute infarction. No midline shift, mass effect, evidence of mass lesion, ventriculomegaly, extra-axial collection or acute intracranial hemorrhage. Cervicomedullary junction and pituitary are within normal limits. Negative visualized cervical spine. Major intracranial vascular flow voids are preserved.  Mild for age nonspecific mostly periventricular cerebral white matter T2 and FLAIR hyperintensity. No cortical encephalomalacia. There is a chronic lacunar infarct in the left thalamus. Other deep gray matter nuclei, the brainstem, and cerebellum are within normal limits for  age. Visible internal auditory structures appear normal.  Trace left mastoid fluid. Negative visualized nasopharynx. Trace paranasal sinus mucosal thickening. Postoperative changes to both globes. Visualized scalp soft tissues are within normal limits. Normal bone marrow signal.  IMPRESSION: 1.  No acute intracranial abnormality. 2. Mild for age chronic small vessel disease.   Electronically Signed   By: Lars Pinks M.D.   On: 06/24/2014 16:29   Dg Chest Port 1 View  06/26/2014   CLINICAL DATA:  Acute encephalopathy  EXAM: PORTABLE CHEST - 1 VIEW  COMPARISON:  06/23/2014  FINDINGS: New ill-defined hazy perihilar airspace opacities are identified with interstitial Kerley B-lines and trace pleural effusions. Mild cardiomegaly with evidence of CABG reidentified.  IMPRESSION: Interval development of mild pulmonary edema and trace effusions.   Electronically Signed   By: Conchita Paris M.D.   On: 06/26/2014 14:17  Dg Chest Port 1 View  06/23/2014   CLINICAL DATA:  80 year old male with acute myocardial infarction. Initial encounter.  EXAM: PORTABLE CHEST - 1 VIEW  COMPARISON:  07/01/2008 chest CT  FINDINGS: Cardiomegaly and CABG changes noted.  There is no evidence of focal airspace disease, pulmonary edema, suspicious pulmonary nodule/mass, pleural effusion, or pneumothorax. No acute bony abnormalities are identified.  IMPRESSION: Cardiomegaly without evidence of acute cardiopulmonary disease.   Electronically Signed   By: Hassan Rowan M.D.   On: 06/23/2014 23:48    Microbiology: Recent Results (from the past 240 hour(s))  Urine culture     Status: None   Collection Time: 06/23/14  6:48 PM  Result Value Ref Range Status   Specimen Description URINE, CLEAN CATCH  Final   Special Requests NONE  Final   Culture  Setup Time   Final    06/24/2014 01:10 Performed at [place] Performed at [facility]   Final   Culture NO GROWTH Performed at [facility]   Final   Report Status 06/24/2014 FINAL  Final  MRSA PCR Screening     Status: None   Collection Time: 06/26/14  5:02 AM  Result Value Ref Range Status   MRSA by PCR NEGATIVE NEGATIVE Final    Comment:        The GeneXpert MRSA Assay (FDA approved for NASAL specimens only), is one component of a comprehensive MRSA colonization surveillance program. It is not intended to diagnose MRSA infection nor to guide or monitor treatment for MRSA infections.   Clostridium Difficile by PCR     Status: Abnormal   Collection Time: 06/28/14  5:24 AM  Result Value Ref Range Status   C difficile by pcr POSITIVE (A) NEGATIVE Final    Comment: CRITICAL RESULT CALLED TO, READ BACK BY AND VERIFIED WITH: [facility] RN @ 0848 06/28/14 LEONARD,A      Labs: Basic Metabolic Panel:  Recent Labs Lab 06/23/14 1800 06/24/14 0406 06/25/14 0342 06/26/14 0321 06/27/14 0340  NA 139 137 135* 137 140  K 4.0 3.3* 3.9 3.2* 3.5*  CL 102 104 102 101 105  CO2 24 23 20 20 21   GLUCOSE 104* 101* 110* 116* 105*  BUN 34* 27* 22 18 19   CREATININE 1.41* 1.07 1.07 1.14 1.12  CALCIUM 9.0 8.6 8.3* 8.7 8.7  MG  --   --   --  1.9  --    Liver Function Tests:  Recent Labs Lab 06/23/14 1800 06/24/14 0406  AST 42* 28  ALT 19 15  ALKPHOS 60 52  BILITOT 0.4 0.8  PROT 7.0 6.0  ALBUMIN 3.2* 2.6*   No results for input(s): LIPASE, AMYLASE in the last 168 hours. No results for input(s): AMMONIA in the last 168 hours. CBC:  Recent Labs Lab 06/23/14 1800 06/24/14 0406 06/25/14 0342 06/26/14 0321 06/27/14 0340  WBC 10.3 6.9 7.6 11.1* 8.8  NEUTROABS 6.5 3.9  --   --   --   HGB 12.4* 11.3* 11.5* 11.7* 11.8*  HCT 36.4* 33.0* 33.2* 32.8* 33.5*  MCV 94.3 93.5 93.5 94.3 95.7  PLT 159 153 168 179 194   Cardiac Enzymes:  Recent Labs Lab 06/23/14 2315 06/24/14 0406 06/24/14 1027 06/24/14 1646 06/24/14 2140  CKTOTAL  --   --  101  --   --   CKMB  --   --  4.2*  --   --   TROPONINI 6.91* 6.24*  4.69* 2.91* 3.72*   BNP: BNP (last 3 results) No results for input(s): PROBNP in the last 8760 hours. CBG:  Recent Labs Lab 06/28/14 1138 06/28/14 2007 06/29/14 0133 06/29/14 0350 06/29/14 0753  GLUCAP 134* 125* 143* 137* 98       Signed:  Jshawn Hurta  [facility] 06/29/2014, 11:36 AM

## 2014-06-29 NOTE — Progress Notes (Signed)
Pt discharged to SNF via stretcher per EMS, condition stable, report called to [facility].

## 2014-06-29 NOTE — Progress Notes (Signed)
CSW (Clinical Education officer, museum) prepared pt dc packet and placed with shadow chart. CSW arranged non-emergent ambulance transport by 1:30pm. Pt, pt family, pt nurse, and facility informed. CSW signing off.  [place], [place]

## 2014-06-29 NOTE — Progress Notes (Signed)
CSW (Clinical Education officer, museum) notified facility pt is ready for dc today and that pt positive for CDIFF. CSW received insurance authorization number [id number]. Facility confirmed pt is okay to dc after paperwork is completed. CSW spoke with pt daughter and updated. Plan will be for dc after lunch pending dc paperwork and facility paperwork is completed. MD aware that pt has bed at facility.  Henry Curtis, Henry Curtis

## 2014-06-30 ENCOUNTER — Non-Acute Institutional Stay (SKILLED_NURSING_FACILITY): Payer: Medicare Other | Admitting: Adult Health

## 2014-06-30 ENCOUNTER — Encounter: Payer: Self-pay | Admitting: Adult Health

## 2014-06-30 DIAGNOSIS — I2510 Atherosclerotic heart disease of native coronary artery without angina pectoris: Secondary | ICD-10-CM

## 2014-06-30 DIAGNOSIS — I214 Non-ST elevation (NSTEMI) myocardial infarction: Secondary | ICD-10-CM

## 2014-06-30 DIAGNOSIS — R338 Other retention of urine: Secondary | ICD-10-CM

## 2014-06-30 DIAGNOSIS — A0472 Enterocolitis due to Clostridium difficile, not specified as recurrent: Secondary | ICD-10-CM

## 2014-06-30 DIAGNOSIS — A047 Enterocolitis due to Clostridium difficile: Secondary | ICD-10-CM

## 2014-06-30 DIAGNOSIS — I10 Essential (primary) hypertension: Secondary | ICD-10-CM

## 2014-06-30 DIAGNOSIS — R5381 Other malaise: Secondary | ICD-10-CM

## 2014-06-30 DIAGNOSIS — E785 Hyperlipidemia, unspecified: Secondary | ICD-10-CM

## 2014-06-30 NOTE — Progress Notes (Signed)
Patient ID: Henry Curtis, male   DOB: 1921-12-09, 80 y.o.   MRN: [id number]   06/30/2014  Facility:  [facility] Location:  [place] Room Number: 307-P LEVEL OF CARE:  SNF (31)   Chief Complaint  Patient presents with  . Hospitalization Follow-up    NSTEMI, C. Difficille infection, hypertension, urinary retention, hyperlipidemia and physical deconditioning    HISTORY OF PRESENT ILLNESS:  This is an 80 year old male who has been admitted to [facility] on 06/29/14 from [facility]. He was having confusion for 2 days at home and was seen by his PCP. Urinalysis was suggestive for UTI and EKG showed ST depression and was sent to the ED. It was noted that his troponin at the ED was elevated and cardiac catheterization was done which showed multi-vessel disease status post DES placement. He has been admitted for a short-term rehabilitation  REASSESSMENT OF ONGOING PROBLEMS:  HTN: Pt 's HTN remains stable.  Denies CP, sob, DOE, pedal edema, headaches, dizziness or visual disturbances.  No complications from the medications currently being used.  Last BP : 111/58  HYPERLIPIDEMIA: No complications from the medications presently being used. 12/15 fasting lipid panel showed : Cholesterol 147 triglycerides 57 HDL 42 LDL 94  C. Difficile COLITIS:  Patient denies ongoing diarrhea, abdominal pain, nausea or vomiting. No complications reported from the flagyl.   PAST MEDICAL HISTORY:  Past Medical History  Diagnosis Date  . Prostate cancer   . Hypertension   . Myocardial infarction   . Coronary artery disease involving native coronary artery 12/1989    Unstable Angina  . S/P CABG x 3 12/27/1989    LIMA-D1-LAD, SVG-rPDA (PDA Endarterectomy & patch andioplasty)     CURRENT MEDICATIONS: Reviewed per MAR/see medication list  No Known Allergies   REVIEW OF SYSTEMS:  GENERAL: no change in appetite, no fatigue, no weight changes, no fever, chills or  weakness RESPIRATORY: no cough, SOB, DOE, wheezing, hemoptysis CARDIAC: no chest pain, edema or palpitations GI: no abdominal pain, diarrhea, constipation, heart burn, nausea or vomiting  PHYSICAL EXAMINATION  GENERAL: no acute distress, normal body habitus EYES: conjunctivae normal, sclerae normal, normal eye lids MOUTH: noted gum sores  NECK: supple, trachea midline, no neck masses, no thyroid tenderness, no thyromegaly LYMPHATICS: no LAN in the neck, no supraclavicular LAN RESPIRATORY: breathing is even & unlabored, BS CTAB CARDIAC: RRR, no murmur,no extra heart sounds, no edema GI: abdomen soft, normal BS, no masses, no tenderness, no hepatomegaly, no splenomegaly GU:  + foley catheter with amber colored urine EXTREMITIES: Able to move all 4 extremities PSYCHIATRIC: the patient is alert & oriented to person, affect & behavior appropriate  LABS/RADIOLOGY: Labs reviewed: Basic Metabolic Panel:  Recent Labs  06/25/14 0342 06/26/14 0321 06/27/14 0340  NA 135* 137 140  K 3.9 3.2* 3.5*  CL 102 101 105  CO2 20 20 21   GLUCOSE 110* 116* 105*  BUN 22 18 19   CREATININE 1.07 1.14 1.12  CALCIUM 8.3* 8.7 8.7  MG  --  1.9  --    Liver Function Tests:  Recent Labs  06/23/14 1800 06/24/14 0406  AST 42* 28  ALT 19 15  ALKPHOS 60 52  BILITOT 0.4 0.8  PROT 7.0 6.0  ALBUMIN 3.2* 2.6*   CBC:  Recent Labs  06/23/14 1800 06/24/14 0406 06/25/14 0342 06/26/14 0321 06/27/14 0340  WBC 10.3 6.9 7.6 11.1* 8.8  NEUTROABS 6.5 3.9  --   --   --  HGB 12.4* 11.3* 11.5* 11.7* 11.8*  HCT 36.4* 33.0* 33.2* 32.8* 33.5*  MCV 94.3 93.5 93.5 94.3 95.7  PLT 159 153 168 179 194   Lipid Panel:  Recent Labs  06/24/14 0500  HDL 42   Cardiac Enzymes:  Recent Labs  06/24/14 1027 06/24/14 1646 06/24/14 2140  CKTOTAL 101  --   --   CKMB 4.2*  --   --   TROPONINI 4.69* 2.91* 3.72*   CBG:  Recent Labs  06/29/14 0133 06/29/14 0350 06/29/14 0753  GLUCAP 143* 137* 98    Ct  Head Wo Contrast  06/23/2014   CLINICAL DATA:  Mental status change.  Confusion.  EXAM: CT HEAD WITHOUT CONTRAST  TECHNIQUE: Contiguous axial images were obtained from the base of the skull through the vertex without intravenous contrast.  COMPARISON:  None.  FINDINGS: Stable age related cerebral atrophy, ventriculomegaly and periventricular white matter disease. No extra-axial fluid collections are identified. No CT findings for acute hemispheric infarction or intracranial hemorrhage. No mass lesions. The brainstem and cerebellum are normal.  The No acute bony findings. The paranasal sinuses and mastoid air cells are clear. The globes are intact.  IMPRESSION: Age related cerebral atrophy, ventriculomegaly periventricular white matter disease. No acute intracranial findings or mass lesions.   Electronically Signed   By: Kalman Jewels M.D.   On: 06/23/2014 19:55   Mr Brain Wo Contrast  06/24/2014   CLINICAL DATA:  80 year old male with acute encephalopathy, found confused. Initial encounter.  EXAM: MRI HEAD WITHOUT CONTRAST  TECHNIQUE: Multiplanar, multiecho pulse sequences of the brain and surrounding structures were obtained without intravenous contrast.  COMPARISON:  Head CT without contrast 06/23/2014.  FINDINGS: Cerebral volume is within normal limits for age. No restricted diffusion to suggest acute infarction. No midline shift, mass effect, evidence of mass lesion, ventriculomegaly, extra-axial collection or acute intracranial hemorrhage. Cervicomedullary junction and pituitary are within normal limits. Negative visualized cervical spine. Major intracranial vascular flow voids are preserved.  Mild for age nonspecific mostly periventricular cerebral white matter T2 and FLAIR hyperintensity. No cortical encephalomalacia. There is a chronic lacunar infarct in the left thalamus. Other deep gray matter nuclei, the brainstem, and cerebellum are within normal limits for age. Visible internal auditory structures  appear normal.  Trace left mastoid fluid. Negative visualized nasopharynx. Trace paranasal sinus mucosal thickening. Postoperative changes to both globes. Visualized scalp soft tissues are within normal limits. Normal bone marrow signal.  IMPRESSION: 1.  No acute intracranial abnormality. 2. Mild for age chronic small vessel disease.   Electronically Signed   By: Lars Pinks M.D.   On: 06/24/2014 16:29   Dg Chest Port 1 View  06/26/2014   CLINICAL DATA:  Acute encephalopathy  EXAM: PORTABLE CHEST - 1 VIEW  COMPARISON:  06/23/2014  FINDINGS: New ill-defined hazy perihilar airspace opacities are identified with interstitial Kerley B-lines and trace pleural effusions. Mild cardiomegaly with evidence of CABG reidentified.  IMPRESSION: Interval development of mild pulmonary edema and trace effusions.   Electronically Signed   By: Conchita Paris M.D.   On: 06/26/2014 14:17   Dg Chest Port 1 View  06/23/2014   CLINICAL DATA:  80 year old male with acute myocardial infarction. Initial encounter.  EXAM: PORTABLE CHEST - 1 VIEW  COMPARISON:  07/01/2008 chest CT  FINDINGS: Cardiomegaly and CABG changes noted.  There is no evidence of focal airspace disease, pulmonary edema, suspicious pulmonary nodule/mass, pleural effusion, or pneumothorax. No acute bony abnormalities are identified.  IMPRESSION: Cardiomegaly without  evidence of acute cardiopulmonary disease.   Electronically Signed   By: Hassan Rowan M.D.   On: 06/23/2014 23:48    ASSESSMENT/PLAN:  NSTEMI - continue aspirin 325 mg by mouth daily, Plavix 75 mg by mouth daily and metoprolol 12.5 mg by mouth twice a day CAD - stable C. difficile enteritis - continue Flagyl 500 mg by mouth every 8 hours till 07/11/14 and Florastor Hypertension - well controlled; continue losartan 100 mg by mouth daily and Lopressor 12.5 mg by mouth twice a day Urinary retention - has Foley catheter; follow-up with Dr. Juliette Alcide Hyperlipidemia - continue atorvastatin 80 mg by mouth  daily Physical deconditioning - for rehabilitation Mouth sores - start Magic mouthwash 10 mL swish and spit 4 times a day 7 days  Spent 50 minutes in patient care.    [facility], NP [employer] [phone]

## 2014-07-01 ENCOUNTER — Encounter (HOSPITAL_COMMUNITY): Payer: Self-pay | Admitting: Cardiology

## 2014-07-02 ENCOUNTER — Ambulatory Visit: Payer: Medicare Other

## 2014-07-05 NOTE — Progress Notes (Signed)
HPI: follow-up coronary artery disease. Patient has had previous coronary artery bypass and graft in 1991 with a LIMA to the diagonal and LAD, saphenous vein graft to the PDA.Recently admitted with increased confusion and fatigue. Troponins were abnormal. Echocardiogram December 2015 showed an ejection fraction of 45-50% and moderate mitral regurgitation. Cardiac catheterization December 2015 showed an occluded LAD. There was a 95% lesion in the LAD distal to the LIMA insertion. There was also another 95% lesion distal to that. There are extensive collaterals to the distal LAD. The circumflex was occluded. There were bridging collaterals into the second and third marginal. The right coronary was occluded as was the saphenous vein graft to the right coronary artery. Patient had PCI of the distal LAD with drug-eluting stent and PTCA. Treated for Clostridium difficile colitis. Since discharge there is no dyspnea, chest pain, palpitations or syncope. No pedal edema. He continues to have occasional confusion. However it is much improved.  Current Outpatient Prescriptions  Medication Sig Dispense Refill  . aspirin EC 325 MG EC tablet Take 1 tablet (325 mg total) by mouth daily. 30 tablet 0  . atorvastatin (LIPITOR) 80 MG tablet Take 1 tablet (80 mg total) by mouth daily at 6 PM. 30 tablet 0  . clopidogrel (PLAVIX) 75 MG tablet Take 1 tablet (75 mg total) by mouth daily with breakfast. 30 tablet 0  . feeding supplement, ENSURE COMPLETE, (ENSURE COMPLETE) LIQD Take 237 mLs by mouth daily at 3 pm. 30 Bottle 0  . losartan (COZAAR) 100 MG tablet Take 100 mg by mouth daily.    . metoprolol tartrate (LOPRESSOR) 12.5 mg TABS tablet Take 0.5 tablets (12.5 mg total) by mouth 2 (two) times daily. 60 tablet 0  . metroNIDAZOLE (FLAGYL) 500 MG tablet Take 1 tablet (500 mg total) by mouth every 8 (eight) hours. 40 tablet 0  . saccharomyces boulardii (FLORASTOR) 250 MG capsule Take 1 capsule (250 mg total) by mouth 2  (two) times daily. 60 capsule 0   No current facility-administered medications for this visit.     Past Medical History  Diagnosis Date  . Prostate cancer   . Hypertension   . Myocardial infarction   . Coronary artery disease involving native coronary artery 12/1989    Unstable Angina  . S/P CABG x 3 12/27/1989    LIMA-D1-LAD, SVG-rPDA (PDA Endarterectomy & patch andioplasty)     Past Surgical History  Procedure Laterality Date  . Appendectomy    . Multiple tooth extractions Bilateral   . Coronary artery bypass graft    . Left heart catheterization with coronary/graft angiogram N/A 06/25/2014    Procedure: LEFT HEART CATHETERIZATION WITH Beatrix Fetters;  Surgeon: Leonie Man, MD;  Location: [facility] CATH LAB;  Service: Cardiovascular;  Laterality: N/A;    History   Social History  . Marital Status: Widowed    Spouse Name: N/A    Number of Children: N/A  . Years of Education: N/A   Occupational History  . Not on file.   Social History Main Topics  . Smoking status: Former Research scientist (life sciences)  . Smokeless tobacco: Not on file  . Alcohol Use: No  . Drug Use: No  . Sexual Activity: Not on file   Other Topics Concern  . Not on file   Social History Narrative    ROS: no fevers or chills, productive cough, hemoptysis, dysphasia, odynophagia, melena, hematochezia, dysuria, hematuria, rash, seizure activity, orthopnea, PND, pedal edema, claudication. Remaining systems are negative.  Physical  Exam: Well-developed well-nourished in no acute distress.  Skin is warm and dry.  HEENT is normal.  Neck is supple.  Chest is clear to auscultation with normal expansion.  Cardiovascular exam is regular rate and rhythm.  Abdominal exam nontender or distended. No masses palpated. Extremities show no edema. neuro grossly intact

## 2014-07-07 ENCOUNTER — Encounter: Payer: Self-pay | Admitting: Internal Medicine

## 2014-07-07 ENCOUNTER — Encounter: Payer: Self-pay | Admitting: Cardiology

## 2014-07-07 ENCOUNTER — Ambulatory Visit (INDEPENDENT_AMBULATORY_CARE_PROVIDER_SITE_OTHER): Payer: Medicare Other | Admitting: Cardiology

## 2014-07-07 VITALS — BP 124/68 | HR 77 | Ht 69.0 in

## 2014-07-07 DIAGNOSIS — E785 Hyperlipidemia, unspecified: Secondary | ICD-10-CM

## 2014-07-07 DIAGNOSIS — I10 Essential (primary) hypertension: Secondary | ICD-10-CM

## 2014-07-07 DIAGNOSIS — I2510 Atherosclerotic heart disease of native coronary artery without angina pectoris: Secondary | ICD-10-CM

## 2014-07-07 NOTE — Progress Notes (Deleted)
Patient ID: Henry Curtis, male   DOB: Dec 18, 1921, 80 y.o.   MRN: [id number]     [facility]   PCP: Tommy Medal, MD  Code Status: full code  No Known Allergies  Chief Complaint  Patient presents with  . New Admit To SNF     HPI:  80 y/o male pt is here for STR post hospital admission from 06/23/14-06/29/14 with confusion, he was noted to have NSTEMI. IV heparin drip was started and cardiology consulted. He underwent cardiac cath on 12/4 showing severe multivessel disease and is now s/p DES placement with aspirin and plavix. Post procedure he became combative and confused with urinary retention. He was treated with rocephin x 5 days for presumed UTI. He was then started pn flagyl for c.diff.   He has PMH of coronary artery disease status post CABG, hypertension   Review of Systems:  Constitutional: Negative for fever, chills, weight loss, malaise/fatigue and diaphoresis.  HENT: Negative for congestion, hearing loss, earache and sore throat.   Eyes: Negative for eye pain, blurred vision, double vision and discharge.  Respiratory: Negative for cough, sputum production, shortness of breath and wheezing.   Cardiovascular: Negative for chest pain, palpitations, orthopnea and leg swelling.  Gastrointestinal: Negative for heartburn, nausea, vomiting, abdominal pain, melena, rectal bleed, diarrhea and constipation. appetite is  Genitourinary: Negative for dysuria, urgency, frequency, hematuria, nocturia and flank pain.  Musculoskeletal: Negative for back pain, falls, joint pain and myalgias. assistive device used Skin: Negative for itching, sores and rash.  Neurological: Negative for weakness,dizziness, tingling, focal weakness and headaches.  Psychiatric/Behavioral: Negative for depression, insomnia and memory loss. The patient is not nervous/anxious.     Past Medical History  Diagnosis Date  . Prostate cancer   . Hypertension   . Myocardial  infarction   . Coronary artery disease involving native coronary artery 12/1989    Unstable Angina  . S/P CABG x 3 12/27/1989    LIMA-D1-LAD, SVG-rPDA (PDA Endarterectomy & patch andioplasty)    Past Surgical History  Procedure Laterality Date  . Appendectomy    . Multiple tooth extractions Bilateral   . Coronary artery bypass graft    . Left heart catheterization with coronary/graft angiogram N/A 06/25/2014    Procedure: LEFT HEART CATHETERIZATION WITH Beatrix Fetters;  Surgeon: Leonie Man, MD;  Location: [facility] CATH LAB;  Service: Cardiovascular;  Laterality: N/A;   Social History:   reports that he has quit smoking. He does not have any smokeless tobacco history on file. He reports that he does not drink alcohol or use illicit drugs.  Family History  Problem Relation Age of Onset  . Diabetes Father   . Deep vein thrombosis Father     Medications: Patient's Medications  New Prescriptions   No medications on file  Previous Medications   ASPIRIN EC 325 MG EC TABLET    Take 1 tablet (325 mg total) by mouth daily.   ATORVASTATIN (LIPITOR) 80 MG TABLET    Take 1 tablet (80 mg total) by mouth daily at 6 PM.   CLOPIDOGREL (PLAVIX) 75 MG TABLET    Take 1 tablet (75 mg total) by mouth daily with breakfast.   FEEDING SUPPLEMENT, ENSURE COMPLETE, (ENSURE COMPLETE) LIQD    Take 237 mLs by mouth daily at 3 pm.   LOSARTAN (COZAAR) 100 MG TABLET    Take 100 mg by mouth daily.   METOPROLOL TARTRATE (LOPRESSOR) 12.5 MG TABS TABLET    Take 0.5 tablets (  12.5 mg total) by mouth 2 (two) times daily.   METRONIDAZOLE (FLAGYL) 500 MG TABLET    Take 1 tablet (500 mg total) by mouth every 8 (eight) hours.   SACCHAROMYCES BOULARDII (FLORASTOR) 250 MG CAPSULE    Take 1 capsule (250 mg total) by mouth 2 (two) times daily.  Modified Medications   No medications on file  Discontinued Medications   No medications on file     Physical Exam: *** Filed Vitals:   07/07/14 1455  BP: 114/66  Pulse:  66  Temp: 97.2 F (36.2 C)  Resp: 16    General- elderly male in no acute distress Head- atraumatic, normocephalic Eyes- PERRLA, EOMI, no pallor, no icterus, no discharge Neck- no cervical lymphadenopathy, no thyromegaly, no jugular vein distension, no carotid bruit Ears- left ear normal tympanic membrane and normal external ear canal , right ear normal tympanic membrane and normal external ear canal Throat- moist mucus membrane, normal oropharynx, dentition is  Nose- normal nasal mucosa, no maxillary or frontal sinus tenderness Chest- no chest wall deformities, no chest wall tenderness Breast- no masses, no palpable lumps, normal nipple and areola exam, no axillary lymphadenopathy Cardiovascular- normal s1,s2, no murmurs/ rubs/ gallops, dorsalis pedis Respiratory- bilateral clear to auscultation, no wheeze, no rhonchi, no crackles, no use of accessory muscles Abdomen- bowel sounds present, soft, non tender, no organomegaly, no abdominal bruits, no guarding or rigidity, no CVA tenderness Pelvic exam- normal pelvic exam, no adenexal or cervical motion tenderness Musculoskeletal- able to move all 4 extremities, no spinal and paraspinal tenderness, steady gait, no use of assistive device, normal range of motion, no leg edema Neurological- no focal deficit, normal reflexes, normal muscle strength, normal sensation to fine touch and vibration Skin- warm and dry Psychiatry- alert and oriented to person, place and time, normal mood and affect    Labs reviewed: Basic Metabolic Panel:  Recent Labs  06/25/14 0342 06/26/14 0321 06/27/14 0340  NA 135* 137 140  K 3.9 3.2* 3.5*  CL 102 101 105  CO2 20 20 21   GLUCOSE 110* 116* 105*  BUN 22 18 19   CREATININE 1.07 1.14 1.12  CALCIUM 8.3* 8.7 8.7  MG  --  1.9  --    Liver Function Tests:  Recent Labs  06/23/14 1800 06/24/14 0406  AST 42* 28  ALT 19 15  ALKPHOS 60 52  BILITOT 0.4 0.8  PROT 7.0 6.0  ALBUMIN 3.2* 2.6*   No  results for input(s): LIPASE, AMYLASE in the last 8760 hours. No results for input(s): AMMONIA in the last 8760 hours. CBC:  Recent Labs  06/23/14 1800 06/24/14 0406 06/25/14 0342 06/26/14 0321 06/27/14 0340  WBC 10.3 6.9 7.6 11.1* 8.8  NEUTROABS 6.5 3.9  --   --   --   HGB 12.4* 11.3* 11.5* 11.7* 11.8*  HCT 36.4* 33.0* 33.2* 32.8* 33.5*  MCV 94.3 93.5 93.5 94.3 95.7  PLT 159 153 168 179 194   Cardiac Enzymes:  Recent Labs  06/24/14 1027 06/24/14 1646 06/24/14 2140  CKTOTAL 101  --   --   CKMB 4.2*  --   --   TROPONINI 4.69* 2.91* 3.72*   BNP: Invalid input(s): POCBNP CBG:  Recent Labs  06/29/14 0133 06/29/14 0350 06/29/14 0753  GLUCAP 143* 137* 98    Radiological Exams:   EKG: Independently reviewed. ***  Assessment/Plan No problem-specific assessment & plan notes found for this encounter.     {Rehab current functional status:30803}  Family/ staff Communication: ***  Goals of care: ***   Labs/tests ordered    Blanchie Serve, MD  [facility] [phone] (Monday-Friday 8 am - 5 pm) [phone] (afterhours)

## 2014-07-07 NOTE — Patient Instructions (Signed)
Your physician wants you to follow-up in: 6 MONTHS WITH DR CRENSHAW You will receive a reminder letter in the mail two months in advance. If you don't receive a letter, please call our office to schedule the follow-up appointment.  

## 2014-07-07 NOTE — Progress Notes (Signed)
This encounter was created in error - please disregard. This encounter was created in error - please disregard. This encounter was created in error - please disregard. 

## 2014-07-07 NOTE — Assessment & Plan Note (Signed)
Patient with no recurrent symptoms. Continue aspirin, Plavix and statin.

## 2014-07-07 NOTE — Assessment & Plan Note (Signed)
Continue statin. 

## 2014-07-07 NOTE — Assessment & Plan Note (Signed)
Blood pressure controlled. Continue present medications. 

## 2014-07-09 ENCOUNTER — Non-Acute Institutional Stay (SKILLED_NURSING_FACILITY): Payer: Medicare Other | Admitting: Internal Medicine

## 2014-07-09 ENCOUNTER — Encounter: Payer: Self-pay | Admitting: Internal Medicine

## 2014-07-09 DIAGNOSIS — A0472 Enterocolitis due to Clostridium difficile, not specified as recurrent: Secondary | ICD-10-CM

## 2014-07-09 DIAGNOSIS — R339 Retention of urine, unspecified: Secondary | ICD-10-CM

## 2014-07-09 DIAGNOSIS — I10 Essential (primary) hypertension: Secondary | ICD-10-CM

## 2014-07-09 DIAGNOSIS — I214 Non-ST elevation (NSTEMI) myocardial infarction: Secondary | ICD-10-CM

## 2014-07-09 DIAGNOSIS — R5381 Other malaise: Secondary | ICD-10-CM

## 2014-07-09 DIAGNOSIS — A047 Enterocolitis due to Clostridium difficile: Secondary | ICD-10-CM

## 2014-07-09 NOTE — Progress Notes (Signed)
Patient ID: Henry Curtis, male   DOB: Mar 10, 1922, 80 y.o.   MRN: [id number]     [facility]   PCP: Tommy Medal, MD  Code Status: full code  No Known Allergies  Chief Complaint  Patient presents with  . New Admit To SNF     HPI:  80 y/o male pt is here for STR post hospital admission from 06/23/14-06/29/14 with confusion. He was noted to have NSTEMI. IV heparin drip was started and cardiology consulted. He underwent cardiac cath on 12/4 showing severe multivessel disease and is now s/p DES placement with aspirin and plavix. Post procedure he became combative and confused with urinary retention. He was treated with rocephin x 5 days for presumed UTI. He was then started pn flagyl for c.diff.  He has PMH of coronary artery disease status post CABG, hypertension He was seen by cardiology for follow up post op on 07/07/14, notes reviewed. No new changes Patient is seen in his room today. He denies any chest pain. He feels low in terms of his energy and gets tired easily. He denies any further loose stool. Appetite is picking up.  Review of Systems:  Constitutional: Negative for fever, chills, diaphoresis.  HENT: Negative for congestion Respiratory: Negative for cough, shortness of breath and wheezing.   Cardiovascular: Negative for chest pain, palpitations,leg swelling.  Gastrointestinal: Negative for heartburn, nausea, vomiting, abdominal pain, diarrhea and constipation. appetite is  Genitourinary: has a foley in place  Musculoskeletal: Negative for back pain, falls Skin: Negative for itching, rash.  Neurological: Negative for dizziness, tingling, focal weakness and headaches.  Psychiatric/Behavioral: Negative for depression  Past Medical History  Diagnosis Date  . Prostate cancer   . Hypertension   . Myocardial infarction   . Coronary artery disease involving native coronary artery 12/1989    Unstable Angina  . S/P CABG x 3 12/27/1989   LIMA-D1-LAD, SVG-rPDA (PDA Endarterectomy & patch andioplasty)    Past Surgical History  Procedure Laterality Date  . Appendectomy    . Multiple tooth extractions Bilateral   . Coronary artery bypass graft    . Left heart catheterization with coronary/graft angiogram N/A 06/25/2014    Procedure: LEFT HEART CATHETERIZATION WITH Beatrix Fetters;  Surgeon: Leonie Man, MD;  Location: [facility] CATH LAB;  Service: Cardiovascular;  Laterality: N/A;   Social History:   reports that he has quit smoking. He does not have any smokeless tobacco history on file. He reports that he does not drink alcohol or use illicit drugs.  Family History  Problem Relation Age of Onset  . Diabetes Father   . Deep vein thrombosis Father     Medications: Patient's Medications  New Prescriptions   No medications on file  Previous Medications   ASPIRIN EC 325 MG EC TABLET    Take 1 tablet (325 mg total) by mouth daily.   ATORVASTATIN (LIPITOR) 80 MG TABLET    Take 1 tablet (80 mg total) by mouth daily at 6 PM.   CLOPIDOGREL (PLAVIX) 75 MG TABLET    Take 1 tablet (75 mg total) by mouth daily with breakfast.   FEEDING SUPPLEMENT, ENSURE COMPLETE, (ENSURE COMPLETE) LIQD    Take 237 mLs by mouth daily at 3 pm.   LOSARTAN (COZAAR) 100 MG TABLET    Take 100 mg by mouth daily.   METOPROLOL TARTRATE (LOPRESSOR) 12.5 MG TABS TABLET    Take 0.5 tablets (12.5 mg total) by mouth 2 (two) times daily.  METRONIDAZOLE (FLAGYL) 500 MG TABLET    Take 1 tablet (500 mg total) by mouth every 8 (eight) hours.   SACCHAROMYCES BOULARDII (FLORASTOR) 250 MG CAPSULE    Take 1 capsule (250 mg total) by mouth 2 (two) times daily.  Modified Medications   No medications on file  Discontinued Medications   No medications on file     Physical Exam: Filed Vitals:   07/09/14 1000  BP: 114/66  Pulse: 66  Temp: 97.2 F (36.2 C)  Resp: 16    General- elderly male in no acute distress Head- atraumatic, normocephalic Eyes-  PERRLA, EOMI, no pallor, no icterus, no discharge Neck- no cervical lymphadenopathy Cardiovascular- normal s1,s2, no murmurs, palpable dorsalis pedis Respiratory- bilateral clear to auscultation, no wheeze, no rhonchi, no crackles, no use of accessory muscles Abdomen- bowel sounds present, soft, non tender Musculoskeletal- able to move all 4 extremities, generalized weakness, no leg edema Neurological- no focal deficit Skin- warm and dry Psychiatry- alert and oriented to person, place and time, normal mood and affect   Labs reviewed: Basic Metabolic Panel:  Recent Labs  06/25/14 0342 06/26/14 0321 06/27/14 0340  NA 135* 137 140  K 3.9 3.2* 3.5*  CL 102 101 105  CO2 20 20 21   GLUCOSE 110* 116* 105*  BUN 22 18 19   CREATININE 1.07 1.14 1.12  CALCIUM 8.3* 8.7 8.7  MG  --  1.9  --    Liver Function Tests:  Recent Labs  06/23/14 1800 06/24/14 0406  AST 42* 28  ALT 19 15  ALKPHOS 60 52  BILITOT 0.4 0.8  PROT 7.0 6.0  ALBUMIN 3.2* 2.6*   No results for input(s): LIPASE, AMYLASE in the last 8760 hours. No results for input(s): AMMONIA in the last 8760 hours. CBC:  Recent Labs  06/23/14 1800 06/24/14 0406 06/25/14 0342 06/26/14 0321 06/27/14 0340  WBC 10.3 6.9 7.6 11.1* 8.8  NEUTROABS 6.5 3.9  --   --   --   HGB 12.4* 11.3* 11.5* 11.7* 11.8*  HCT 36.4* 33.0* 33.2* 32.8* 33.5*  MCV 94.3 93.5 93.5 94.3 95.7  PLT 159 153 168 179 194   Cardiac Enzymes:  Recent Labs  06/24/14 1027 06/24/14 1646 06/24/14 2140  CKTOTAL 101  --   --   CKMB 4.2*  --   --   TROPONINI 4.69* 2.91* 3.72*   BNP: Invalid input(s): POCBNP CBG:  Recent Labs  06/29/14 0133 06/29/14 0350 06/29/14 0753  GLUCAP 143* 137* 98    Assessment/Plan  Physical deconditioning Will have him work with physical therapy and occupational therapy team to help with gait training and muscle strengthening exercises.fall precautions. Skin care. Encourage to be out of bed.   NSTEMI Remains chest  pain free. S/p DES. continue aspirin 325 mg daily, Plavix 75 mg daily, losartan 100 mg daily and metoprolol 12.5 mg twice a day. Continue atorvastatin 80 mg daily  C. difficile enteritis No further loose stool. Complete flagyl on 07/11/14. D/c contact precautions.   Hypertension well controlled on losartan 100 mg daily and Lopressor 12.5 mg twice a day  Urinary retention has Foley catheter, to f/u with urology  Hyperlipidemia continue atorvastatin 80 mg daily   Goals of care: short term rehabilitation  Family/ staff Communication: reviewed care plan with patient and nursing supervisor    Blanchie Serve, MD  [place] [phone] (Monday-Friday 8 am - 5 pm) [phone] (afterhours)

## 2014-07-12 ENCOUNTER — Encounter: Payer: Self-pay | Admitting: Adult Health

## 2014-07-12 ENCOUNTER — Non-Acute Institutional Stay (SKILLED_NURSING_FACILITY): Payer: Medicare Other | Admitting: Adult Health

## 2014-07-12 DIAGNOSIS — R339 Retention of urine, unspecified: Secondary | ICD-10-CM

## 2014-07-12 DIAGNOSIS — I10 Essential (primary) hypertension: Secondary | ICD-10-CM

## 2014-07-12 DIAGNOSIS — I214 Non-ST elevation (NSTEMI) myocardial infarction: Secondary | ICD-10-CM

## 2014-07-12 DIAGNOSIS — R5381 Other malaise: Secondary | ICD-10-CM

## 2014-07-12 DIAGNOSIS — E785 Hyperlipidemia, unspecified: Secondary | ICD-10-CM

## 2014-07-12 NOTE — Progress Notes (Signed)
Patient ID: GID SCHOFFSTALL, male   DOB: January 12, 1922, 80 y.o.   MRN: [id number]   07/12/2014  Facility:  Nursing Home Location:  [place] Room Number: 307-P LEVEL OF CARE:  SNF (31)  Chief Complaint  Patient presents with  . Discharge Note    Physical deconditioning, NSTEMI, hypertension, urinary retention and hyperlipidemia   HISTORY OF PRESENT ILLNESS:  This is an 80 year old male who is for discharge home with home health PT, OT, nursing and CNA.  DME: Rolling walker and bedside commode . He has been admitted to [facility] on 06/29/14 from [facility]. He was having confusion for 2 days at home and was seen by his PCP. Urinalysis was suggestive for UTI and EKG showed ST depression and was sent to the ED. It was noted that his troponin at the ED was elevated and cardiac catheterization was done which showed multi-vessel disease status post DES placement. Past medical history significant for hypertension, CAD and urinary retention. He had physical deconditioning and had short-term rehabilitation.  PAST MEDICAL HISTORY:  Past Medical History  Diagnosis Date  . Prostate cancer   . Hypertension   . Myocardial infarction   . Coronary artery disease involving native coronary artery 12/1989    Unstable Angina  . S/P CABG x 3 12/27/1989    LIMA-D1-LAD, SVG-rPDA (PDA Endarterectomy & patch andioplasty)     CURRENT MEDICATIONS: Reviewed per MAR/see medication list  No Known Allergies   REVIEW OF SYSTEMS:  GENERAL: no change in appetite, no fatigue, no weight changes, no fever, chills or weakness RESPIRATORY: no cough, SOB, DOE, wheezing, hemoptysis CARDIAC: no chest pain, edema or palpitations GI: no abdominal pain, diarrhea, constipation, heart burn, nausea or vomiting  PHYSICAL EXAMINATION  GENERAL: no acute distress, normal body habitus EYES: conjunctivae normal, sclerae normal, normal eye lids NECK: supple, trachea midline, no neck masses,  no thyroid tenderness, no thyromegaly LYMPHATICS: no LAN in the neck, no supraclavicular LAN RESPIRATORY: breathing is even & unlabored, BS CTAB CARDIAC: RRR, no murmur,no extra heart sounds, no edema GI: abdomen soft, normal BS, no masses, no tenderness, no hepatomegaly, no splenomegaly EXTREMITIES: Able to move all 4 extremities PSYCHIATRIC: the patient is alert & oriented to person, affect & behavior appropriate  LABS/RADIOLOGY: Labs reviewed: Basic Metabolic Panel:  Recent Labs  06/25/14 0342 06/26/14 0321 06/27/14 0340  NA 135* 137 140  K 3.9 3.2* 3.5*  CL 102 101 105  CO2 20 20 21   GLUCOSE 110* 116* 105*  BUN 22 18 19   CREATININE 1.07 1.14 1.12  CALCIUM 8.3* 8.7 8.7  MG  --  1.9  --    Liver Function Tests:  Recent Labs  06/23/14 1800 06/24/14 0406  AST 42* 28  ALT 19 15  ALKPHOS 60 52  BILITOT 0.4 0.8  PROT 7.0 6.0  ALBUMIN 3.2* 2.6*   CBC:  Recent Labs  06/23/14 1800 06/24/14 0406 06/25/14 0342 06/26/14 0321 06/27/14 0340  WBC 10.3 6.9 7.6 11.1* 8.8  NEUTROABS 6.5 3.9  --   --   --   HGB 12.4* 11.3* 11.5* 11.7* 11.8*  HCT 36.4* 33.0* 33.2* 32.8* 33.5*  MCV 94.3 93.5 93.5 94.3 95.7  PLT 159 153 168 179 194   Lipid Panel:  Recent Labs  06/24/14 0500  HDL 42   Cardiac Enzymes:  Recent Labs  06/24/14 1027 06/24/14 1646 06/24/14 2140  CKTOTAL 101  --   --   CKMB 4.2*  --   --  TROPONINI 4.69* 2.91* 3.72*   CBG:  Recent Labs  06/29/14 0133 06/29/14 0350 06/29/14 0753  GLUCAP 143* 137* 98    Ct Head Wo Contrast  06/23/2014   CLINICAL DATA:  Mental status change.  Confusion.  EXAM: CT HEAD WITHOUT CONTRAST  TECHNIQUE: Contiguous axial images were obtained from the base of the skull through the vertex without intravenous contrast.  COMPARISON:  None.  FINDINGS: Stable age related cerebral atrophy, ventriculomegaly and periventricular white matter disease. No extra-axial fluid collections are identified. No CT findings for acute  hemispheric infarction or intracranial hemorrhage. No mass lesions. The brainstem and cerebellum are normal.  The No acute bony findings. The paranasal sinuses and mastoid air cells are clear. The globes are intact.  IMPRESSION: Age related cerebral atrophy, ventriculomegaly periventricular white matter disease. No acute intracranial findings or mass lesions.   Electronically Signed   By: Kalman Jewels M.D.   On: 06/23/2014 19:55   Mr Brain Wo Contrast  06/24/2014   CLINICAL DATA:  80 year old male with acute encephalopathy, found confused. Initial encounter.  EXAM: MRI HEAD WITHOUT CONTRAST  TECHNIQUE: Multiplanar, multiecho pulse sequences of the brain and surrounding structures were obtained without intravenous contrast.  COMPARISON:  Head CT without contrast 06/23/2014.  FINDINGS: Cerebral volume is within normal limits for age. No restricted diffusion to suggest acute infarction. No midline shift, mass effect, evidence of mass lesion, ventriculomegaly, extra-axial collection or acute intracranial hemorrhage. Cervicomedullary junction and pituitary are within normal limits. Negative visualized cervical spine. Major intracranial vascular flow voids are preserved.  Mild for age nonspecific mostly periventricular cerebral white matter T2 and FLAIR hyperintensity. No cortical encephalomalacia. There is a chronic lacunar infarct in the left thalamus. Other deep gray matter nuclei, the brainstem, and cerebellum are within normal limits for age. Visible internal auditory structures appear normal.  Trace left mastoid fluid. Negative visualized nasopharynx. Trace paranasal sinus mucosal thickening. Postoperative changes to both globes. Visualized scalp soft tissues are within normal limits. Normal bone marrow signal.  IMPRESSION: 1.  No acute intracranial abnormality. 2. Mild for age chronic small vessel disease.   Electronically Signed   By: Lars Pinks M.D.   On: 06/24/2014 16:29   Dg Chest Port 1  View  06/26/2014   CLINICAL DATA:  Acute encephalopathy  EXAM: PORTABLE CHEST - 1 VIEW  COMPARISON:  06/23/2014  FINDINGS: New ill-defined hazy perihilar airspace opacities are identified with interstitial Kerley B-lines and trace pleural effusions. Mild cardiomegaly with evidence of CABG reidentified.  IMPRESSION: Interval development of mild pulmonary edema and trace effusions.   Electronically Signed   By: Conchita Paris M.D.   On: 06/26/2014 14:17   Dg Chest Port 1 View  06/23/2014   CLINICAL DATA:  80 year old male with acute myocardial infarction. Initial encounter.  EXAM: PORTABLE CHEST - 1 VIEW  COMPARISON:  07/01/2008 chest CT  FINDINGS: Cardiomegaly and CABG changes noted.  There is no evidence of focal airspace disease, pulmonary edema, suspicious pulmonary nodule/mass, pleural effusion, or pneumothorax. No acute bony abnormalities are identified.  IMPRESSION: Cardiomegaly without evidence of acute cardiopulmonary disease.   Electronically Signed   By: Hassan Rowan M.D.   On: 06/23/2014 23:48    ASSESSMENT/PLAN:  NSTEMI - continue aspirin 325 mg by mouth daily, Plavix 75 mg by mouth daily and metoprolol 12.5 mg by mouth twice a day Hypertension - well controlled; continue losartan 100 mg by mouth daily and Lopressor 12.5 mg by mouth twice a day Urinary retention -  foley catheter was discontinued today and started on tamsulosin 0.4 mg by mouth daily at bedtime; follow-up with Dr. Juliette Alcide Hyperlipidemia - continue atorvastatin 80 mg by mouth daily Physical deconditioning - for home health PT, OT, CNA and nursing    I have filled out patient's discharge paperwork and written prescriptions.  Patient will receive home health PT, OT, Nursing and CNA.  DME provided: Rolling walker and bedside commode  Total discharge time: Greater than 30 minutes  Discharge time involved coordination of the discharge process with Education officer, museum, nursing staff and therapy department. Medical justification for  home health services/DME verified.    [facility], NP [employer] [phone]

## 2014-07-14 DIAGNOSIS — I2510 Atherosclerotic heart disease of native coronary artery without angina pectoris: Secondary | ICD-10-CM

## 2014-07-14 DIAGNOSIS — Z466 Encounter for fitting and adjustment of urinary device: Secondary | ICD-10-CM

## 2014-07-14 DIAGNOSIS — Z87440 Personal history of urinary (tract) infections: Secondary | ICD-10-CM

## 2014-07-14 DIAGNOSIS — I10 Essential (primary) hypertension: Secondary | ICD-10-CM

## 2014-07-14 DIAGNOSIS — I214 Non-ST elevation (NSTEMI) myocardial infarction: Secondary | ICD-10-CM

## 2014-07-14 DIAGNOSIS — R339 Retention of urine, unspecified: Secondary | ICD-10-CM

## 2014-07-19 ENCOUNTER — Telehealth: Payer: Self-pay | Admitting: Cardiology

## 2014-07-19 NOTE — Telephone Encounter (Signed)
Received a call from [facility] therapist with [facility].Stated she wanted to ask Dr.Crenshaw if ok for patient to receive full range of motion exercises for upper extremities.Wanted to know if any restrictions.Dr.Crenshaw out of office this week.Will send message to [facility] for advice.

## 2014-07-19 NOTE — Telephone Encounter (Signed)
Ok for rom exercises Kirk Ruths

## 2014-07-26 NOTE — Telephone Encounter (Signed)
Left detailed message for mallory of dr Jacalyn Lefevre recommendations.

## 2014-07-27 ENCOUNTER — Telehealth: Payer: Self-pay | Admitting: Cardiology

## 2014-07-27 MED ORDER — NITROGLYCERIN 0.4 MG SL SUBL: 0.4000 mg | SUBLINGUAL_TABLET | SUBLINGUAL | Status: DC | PRN

## 2014-07-27 NOTE — Telephone Encounter (Signed)
Ok for NTG [employer]

## 2014-07-27 NOTE — Telephone Encounter (Signed)
Henry Curtis (daughter ) is calling because they need a prescription for nitro tab . It was talked about when he came in to see Dr. Stanford Breed , but didn't get the prescription. Would like for it to be sent to the [place] attn Dr.Lizardo  at [phone] and request that they mail it overnight. Have to put attention Dr. Dierdre Searles .Marland Kitchen Any questions please call Henry Curtis.    Thanks

## 2014-07-27 NOTE — Telephone Encounter (Signed)
Ok to prescribe [employer] tabs?   Pt states this was discussed at visit 07/07/14.

## 2014-07-27 NOTE — Addendum Note (Signed)
Addended by: Cristopher Estimable on: 07/27/2014 05:17 PM   Modules accepted: Orders

## 2014-07-27 NOTE — Telephone Encounter (Signed)
Script faxed to the number provided

## 2014-07-27 NOTE — Telephone Encounter (Signed)
Spoke with pt, aware NTG has been sent to the pharmacy.

## 2014-07-29 ENCOUNTER — Telehealth: Payer: Self-pay | Admitting: Cardiology

## 2014-07-29 NOTE — Telephone Encounter (Signed)
Would like to wait six weeks from recent MI if possible; could then proceed; cannot DC ASA or plavix given recent stent. Kirk Ruths

## 2014-07-29 NOTE — Telephone Encounter (Signed)
Spoke with Henry Curtis, patient has an urethral stricture that they are going to dilate. Will forward for dr Stanford Breed review

## 2014-07-29 NOTE — Telephone Encounter (Signed)
New message    From office visit 12/16 can patient be clear for surgery.

## 2014-07-29 NOTE — Telephone Encounter (Signed)
Will send this message to selita at the number provided

## 2014-07-29 NOTE — Telephone Encounter (Addendum)
Left message for selita to call, ? What kind of surgery.

## 2014-08-19 ENCOUNTER — Telehealth: Payer: Self-pay | Admitting: Cardiology

## 2014-08-19 NOTE — Telephone Encounter (Signed)
Spoke with pt dtr, Aware of dr crenshaw's recommendations.  

## 2014-08-19 NOTE — Telephone Encounter (Signed)
Left message for Henry Curtis to call back

## 2014-08-19 NOTE — Telephone Encounter (Signed)
Janann Colonel called in wanting to speak with Dr. Stanford Breed about the pt needing to stop taking his plavix due to needing to have a surgery done to have his catheter removed. Please follow up with her  Thanks

## 2014-08-19 NOTE — Telephone Encounter (Signed)
Will forward for dr crenshaw review  

## 2014-08-19 NOTE — Telephone Encounter (Signed)
I spoke with his urologist yesterday; would not want to interrupt plavix for at least 6 months following DES; then could be held for procedure and resumed after; continue asa. Kirk Ruths

## 2014-08-26 ENCOUNTER — Telehealth: Payer: Self-pay | Admitting: Cardiology

## 2014-08-26 NOTE — Telephone Encounter (Signed)
Pt saw his primary doctor, and he told him that he did not need to be on a restricted diet. She would like to know what Dr Stanford Breed thoughts are on that.

## 2014-08-31 NOTE — Telephone Encounter (Signed)
Spoke with pt dtr, questions regarding diet answered.

## 2014-09-07 ENCOUNTER — Ambulatory Visit: Payer: Medicare Other | Admitting: Podiatry

## 2014-09-10 ENCOUNTER — Ambulatory Visit (INDEPENDENT_AMBULATORY_CARE_PROVIDER_SITE_OTHER): Payer: Medicare Other | Admitting: Podiatry

## 2014-09-10 ENCOUNTER — Encounter: Payer: Self-pay | Admitting: Podiatry

## 2014-09-10 DIAGNOSIS — M79673 Pain in unspecified foot: Secondary | ICD-10-CM

## 2014-09-10 DIAGNOSIS — B351 Tinea unguium: Secondary | ICD-10-CM

## 2014-09-13 ENCOUNTER — Encounter: Payer: Self-pay | Admitting: Podiatry

## 2014-09-13 NOTE — Progress Notes (Signed)
Patient ID: Henry Curtis, male   DOB: 02-05-1922, 81 y.o.   MRN: [id number]  Subjective: 81 year old male returns the office today for painful, elongated, thickened toenails. Denies any redness or drainage around the nails. He denies any acute changes since last appointment and no new complaints today. Denies any systemic complaints such as fevers, chills, nausea, vomiting.   Objective: AAO 3, NAD DP/PT pulses palpable, CRT less than 3 seconds Protective sensation intact with Simms Weinstein monofilament, Achilles tendon reflex intact.  Nails hypertrophic, dystrophic, elongated, brittle, discolored 10. There is tenderness overlying these nails. There is no surrounding erythema or drainage along the nail sites. No open lesions or pre-ulcerative lesions are identified. No other areas of tenderness bilateral lower extremities. No overlying edema, erythema, increased warmth. No pain with calf compression, swelling, warmth, erythema.  Assessment: Patient presents with symptomatic onychomycosis  Plan: -Treatment options including alternatives, risks, complications were discussed -Nails sharply debrided 10 without complication/bleeding. -Discussed daily foot inspection. If there are any changes, to call the office immediately.  -Follow-up in 3 months or sooner if any problems are to arise. In the meantime, encouraged to call the office with any questions, concerns, changes symptoms.

## 2014-09-30 ENCOUNTER — Telehealth: Payer: Self-pay | Admitting: Cardiology

## 2014-09-30 NOTE — Telephone Encounter (Signed)
Last note in dec 2015 stated continue with plavix and 325 asa information passed on to pt s wife

## 2014-09-30 NOTE — Telephone Encounter (Signed)
Is he suppose to take an aspirin with the Plavix? If so how many milligrams?

## 2014-10-21 ENCOUNTER — Ambulatory Visit (INDEPENDENT_AMBULATORY_CARE_PROVIDER_SITE_OTHER): Payer: Medicare Other | Admitting: Podiatry

## 2014-10-21 ENCOUNTER — Encounter: Payer: Self-pay | Admitting: Podiatry

## 2014-10-21 VITALS — BP 124/62 | HR 58 | Resp 15

## 2014-10-21 DIAGNOSIS — L600 Ingrowing nail: Secondary | ICD-10-CM

## 2014-10-21 DIAGNOSIS — B351 Tinea unguium: Secondary | ICD-10-CM

## 2014-10-21 NOTE — Progress Notes (Signed)
Subjective:     Patient ID: Henry Curtis, male   DOB: 1922-02-05, 81 y.o.   MRN: [id number]  HPI Asian states have an ingrown toenail my right big toe and several my nails are thick and I cannot cut   Review of Systems     Objective:   Physical Exam Neurovascular status intact with incurvated right hallux medial border that's painful when pressed and makes walking difficult    Assessment:     Ingrown toenail deformity right hallux with nail disease    Plan:     Discussed soaks of ingrown toenail and debrided nailbeds to take pressure off and reappoint as needed

## 2014-10-31 ENCOUNTER — Emergency Department (HOSPITAL_COMMUNITY)
Admission: EM | Admit: 2014-10-31 | Discharge: 2014-10-31 | Disposition: A | Discharge status: Home / Self Care | Payer: Medicare Other | Attending: Emergency Medicine | Admitting: Emergency Medicine

## 2014-10-31 ENCOUNTER — Encounter (HOSPITAL_COMMUNITY): Payer: Self-pay | Admitting: Emergency Medicine

## 2014-10-31 DIAGNOSIS — R319 Hematuria, unspecified: Secondary | ICD-10-CM | POA: Insufficient documentation

## 2014-10-31 DIAGNOSIS — Z7902 Long term (current) use of antithrombotics/antiplatelets: Secondary | ICD-10-CM | POA: Insufficient documentation

## 2014-10-31 DIAGNOSIS — Z9889 Other specified postprocedural states: Secondary | ICD-10-CM | POA: Diagnosis not present

## 2014-10-31 DIAGNOSIS — R339 Retention of urine, unspecified: Secondary | ICD-10-CM | POA: Insufficient documentation

## 2014-10-31 DIAGNOSIS — I252 Old myocardial infarction: Secondary | ICD-10-CM | POA: Insufficient documentation

## 2014-10-31 DIAGNOSIS — Z951 Presence of aortocoronary bypass graft: Secondary | ICD-10-CM | POA: Insufficient documentation

## 2014-10-31 DIAGNOSIS — Z7982 Long term (current) use of aspirin: Secondary | ICD-10-CM | POA: Diagnosis not present

## 2014-10-31 DIAGNOSIS — R300 Dysuria: Secondary | ICD-10-CM | POA: Insufficient documentation

## 2014-10-31 DIAGNOSIS — I10 Essential (primary) hypertension: Secondary | ICD-10-CM | POA: Diagnosis not present

## 2014-10-31 DIAGNOSIS — Z79899 Other long term (current) drug therapy: Secondary | ICD-10-CM | POA: Diagnosis not present

## 2014-10-31 DIAGNOSIS — Z792 Long term (current) use of antibiotics: Secondary | ICD-10-CM | POA: Diagnosis not present

## 2014-10-31 DIAGNOSIS — Z8546 Personal history of malignant neoplasm of prostate: Secondary | ICD-10-CM | POA: Insufficient documentation

## 2014-10-31 DIAGNOSIS — I2510 Atherosclerotic heart disease of native coronary artery without angina pectoris: Secondary | ICD-10-CM | POA: Diagnosis not present

## 2014-10-31 LAB — URINALYSIS, ROUTINE W REFLEX MICROSCOPIC
Bilirubin Urine: NEGATIVE
GLUCOSE, UA: NEGATIVE mg/dL
Ketones, ur: NEGATIVE mg/dL
LEUKOCYTES UA: NEGATIVE
Nitrite: NEGATIVE
PH: 5.5 (ref 5.0–8.0)
Protein, ur: NEGATIVE mg/dL
Specific Gravity, Urine: 1.014 (ref 1.005–1.030)
Urobilinogen, UA: 0.2 mg/dL (ref 0.0–1.0)

## 2014-10-31 LAB — URINE MICROSCOPIC-ADD ON

## 2014-10-31 NOTE — ED Notes (Signed)
Foley bag changed to leg bag.

## 2014-10-31 NOTE — ED Notes (Signed)
Pt's care taker tried to in and out cath this am but too painful for pt so had to stop.

## 2014-10-31 NOTE — ED Notes (Signed)
Bed: WA02 Expected date: 10/31/14 Expected time: 9:55 AM Means of arrival:  Comments: Foley problems, unable to cath this am

## 2014-10-31 NOTE — Discharge Instructions (Signed)
Acute Urinary Retention °Acute urinary retention is the temporary inability to urinate. °This is a common problem in older men. As men age their prostates become larger and block the flow of urine from the bladder. This is usually a problem that has come on gradually.  °HOME CARE INSTRUCTIONS °If you are sent home with a Foley catheter and a drainage system, you will need to discuss the best course of action with your health care provider. While the catheter is in, maintain a good intake of fluids. Keep the drainage bag emptied and lower than your catheter. This is so that contaminated urine will not flow back into your bladder, which could lead to a urinary tract infection. °There are two main types of drainage bags. One is a large bag that usually is used at night. It has a good capacity that will allow you to sleep through the night without having to empty it. The second type is called a leg bag. It has a smaller capacity, so it needs to be emptied more frequently. However, the main advantage is that it can be attached by a leg strap and can go underneath your clothing, allowing you the freedom to move about or leave your home. °Only take over-the-counter or prescription medicines for pain, discomfort, or fever as directed by your health care provider.  °SEEK MEDICAL CARE IF: °· You develop a low-grade fever. °· You experience spasms or leakage of urine with the spasms. °SEEK IMMEDIATE MEDICAL CARE IF:  °· You develop chills or fever. °· Your catheter stops draining urine. °· Your catheter falls out. °· You start to develop increased bleeding that does not respond to rest and increased fluid intake. °MAKE SURE YOU: °· Understand these instructions. °· Will watch your condition. °· Will get help right away if you are not doing well or get worse. °Document Released: 10/15/2000 Document Revised: 07/14/2013 Document Reviewed: 12/18/2012 °ExitCare® Patient Information ©2015 [employer]. This information is not  intended to replace advice given to you by your health care provider. Make sure you discuss any questions you have with your health care provider. ° °

## 2014-10-31 NOTE — ED Notes (Signed)
Per EMS, pt from home. Pt had urinary cath removed a week ago.  Pt was unable to void and home health did in/out yesterday for 400.  This morning attempted again with no success.  Pt c/o abdominal pain and inability to urinate.  Vitals: 121/58, hr 71, resp 16, 98% ra

## 2014-10-31 NOTE — ED Provider Notes (Signed)
CSN: [id number]     Arrival date & time 10/31/14  1003 History   First MD Initiated Contact with Patient 10/31/14 1009     Chief Complaint  Patient presents with  . Urinary Retention     (Consider location/radiation/quality/duration/timing/severity/associated sxs/prior Treatment) HPI Comments: Patient presents with urinary retention. He's had a Foley catheter in place since December when he had urinary retention. He had it removed this past Wednesday by his urologist, Dr. Claris Che. His twin sister states that a home health aide did an in and out yesterday and got 400 cc of urine out. That he hasn't been able to piece since that time. They attempted to do an in and out today but was unsuccessful. He's having increased pressure to his lower abdomen. He denies any vomiting or fevers. He states he hasn't been able to urinate normally since the catheter was removed on Wednesday. He is taking Flomax at home. He took a three-day course of doxycycline following the catheter removal. He's also is noted some blood clots at the tip of his penis intermittently.   Past Medical History  Diagnosis Date  . Prostate cancer   . Hypertension   . Myocardial infarction   . Coronary artery disease involving native coronary artery 12/1989    Unstable Angina  . S/P CABG x 3 12/27/1989    LIMA-D1-LAD, SVG-rPDA (PDA Endarterectomy & patch andioplasty)    Past Surgical History  Procedure Laterality Date  . Appendectomy    . Multiple tooth extractions Bilateral   . Coronary artery bypass graft    . Left heart catheterization with coronary/graft angiogram N/A 06/25/2014    Procedure: LEFT HEART CATHETERIZATION WITH Beatrix Fetters;  Surgeon: Leonie Man, MD;  Location: [facility] CATH LAB;  Service: Cardiovascular;  Laterality: N/A;   Family History  Problem Relation Age of Onset  . Diabetes Father   . Deep vein thrombosis Father    History  Substance Use Topics  . Smoking status: Former Research scientist (life sciences)  .  Smokeless tobacco: Not on file  . Alcohol Use: No    Review of Systems  Constitutional: Negative for fever, chills, diaphoresis and fatigue.  HENT: Negative for congestion, rhinorrhea and sneezing.   Eyes: Negative.   Respiratory: Negative for cough, chest tightness and shortness of breath.   Cardiovascular: Negative for chest pain and leg swelling.  Gastrointestinal: Positive for abdominal pain. Negative for nausea, vomiting, diarrhea and blood in stool.  Genitourinary: Positive for dysuria and hematuria (blood clots at tip of penis). Negative for frequency, flank pain and difficulty urinating.  Musculoskeletal: Negative for back pain and arthralgias.  Skin: Negative for rash.  Neurological: Negative for dizziness, speech difficulty, weakness, numbness and headaches.      Allergies  Review of patient's allergies indicates no known allergies.  Home Medications   Prior to Admission medications   Medication Sig Start Date End Date Taking? Authorizing Provider  aspirin EC 325 MG EC tablet Take 1 tablet (325 mg total) by mouth daily. 06/29/14  Yes [name], MD  atorvastatin (LIPITOR) 80 MG tablet Take 1 tablet (80 mg total) by mouth daily at 6 PM. 06/29/14  Yes [name], MD  clopidogrel (PLAVIX) 75 MG tablet Take 1 tablet (75 mg total) by mouth daily with breakfast. 06/29/14  Yes [name], MD  feeding supplement, ENSURE COMPLETE, (ENSURE COMPLETE) LIQD Take 237 mLs by mouth daily at 3 pm. 06/29/14  Yes [name], MD  Lactobacillus-Inulin (CULTURELLE DIGESTIVE HEALTH PO) Take 1 tablet by mouth  daily.   Yes Historical Provider, MD  losartan (COZAAR) 100 MG tablet Take 100 mg by mouth daily.   Yes Historical Provider, MD  metoprolol tartrate (LOPRESSOR) 12.5 mg TABS tablet Take 0.5 tablets (12.5 mg total) by mouth 2 (two) times daily. 06/29/14  Yes [name], MD  tamsulosin (FLOMAX) 0.4 MG CAPS capsule Take 0.4 mg by mouth daily.   Yes Historical Provider, MD   doxycycline (VIBRA-TABS) 100 MG tablet Take 100 mg by mouth 2 (two) times daily. 10/27/14   Historical Provider, MD  nitroGLYCERIN (NITROSTAT) 0.4 MG SL tablet Place 1 tablet (0.4 mg total) under the tongue every 5 (five) minutes as needed for chest pain. 07/27/14   Lelon Perla, MD  saccharomyces boulardii (FLORASTOR) 250 MG capsule Take 1 capsule (250 mg total) by mouth 2 (two) times daily. Patient not taking: Reported on 10/31/2014 06/29/14   [name], MD   BP 136/59 mmHg  Pulse 77  Temp(Src) 98.2 F (36.8 C) (Oral)  Resp 18  SpO2 99% Physical Exam  Constitutional: He is oriented to person, place, and time. He appears well-developed and well-nourished.  HENT:  Head: Normocephalic and atraumatic.  Eyes: Pupils are equal, round, and reactive to light.  Neck: Normal range of motion. Neck supple.  Cardiovascular: Normal rate, regular rhythm and normal heart sounds.   Pulmonary/Chest: Effort normal and breath sounds normal. No respiratory distress. He has no wheezes. He has no rales. He exhibits no tenderness.  Abdominal: Soft. Bowel sounds are normal. He exhibits distension. There is tenderness (+tenderness to suprapubic area). There is no rebound and no guarding.  Musculoskeletal: Normal range of motion. He exhibits no edema.  Lymphadenopathy:    He has no cervical adenopathy.  Neurological: He is alert and oriented to person, place, and time.  Skin: Skin is warm and dry. No rash noted.  Psychiatric: He has a normal mood and affect.    ED Course  Procedures (including critical care time) Labs Review Labs Reviewed  URINALYSIS, ROUTINE W REFLEX MICROSCOPIC - Abnormal; Notable for the following:    APPearance CLOUDY (*)    Hgb urine dipstick LARGE (*)    All other components within normal limits  URINE CULTURE  URINE MICROSCOPIC-ADD ON    Imaging Review No results found.   EKG Interpretation None      MDM   Final diagnoses:  Urinary retention    Patient has a  Foley catheter placed and there is over thousand cc of urine output. The son is clear and nonbloody. Urinalysis showed some blood but no signs of infection. Patient just finished a 3 day course of doxycycline. At this point I won't restart antibiotics. I instructed the patient to follow-up with [facility] within the next week.    Malvin Johns, MD 10/31/14 [phone]

## 2014-11-02 LAB — URINE CULTURE
Colony Count: >=100000
SPECIAL REQUESTS: NORMAL

## 2014-11-03 ENCOUNTER — Telehealth (HOSPITAL_BASED_OUTPATIENT_CLINIC_OR_DEPARTMENT_OTHER): Payer: Self-pay | Admitting: Emergency Medicine

## 2014-11-03 NOTE — Telephone Encounter (Signed)
Post ED Visit - Positive Culture Follow-up  Culture report reviewed by antimicrobial stewardship pharmacist: []  Wes Dulaney, Pharm.D., BCPS [x]  Heide Guile, Pharm.D., BCPS []  Alycia Rossetti, Pharm.D., BCPS []  [place], Pharm.D., BCPS, AAHIVP []  Legrand Como, Pharm.D., BCPS, AAHIVP []  Isac Sarna, Pharm.D., BCPS  Positive urine culture Enterococcus Treated with none,asymptomatic and no further patient follow-up is required at this time.  Hazle Nordmann 11/03/2014, 1:50 PM

## 2014-11-17 ENCOUNTER — Ambulatory Visit (INDEPENDENT_AMBULATORY_CARE_PROVIDER_SITE_OTHER): Payer: Medicare Other | Admitting: Podiatry

## 2014-11-17 ENCOUNTER — Encounter: Payer: Self-pay | Admitting: Podiatry

## 2014-11-17 VITALS — BP 122/64 | HR 79 | Resp 18

## 2014-11-17 DIAGNOSIS — M79676 Pain in unspecified toe(s): Secondary | ICD-10-CM | POA: Diagnosis not present

## 2014-11-17 DIAGNOSIS — B351 Tinea unguium: Secondary | ICD-10-CM | POA: Diagnosis not present

## 2014-11-19 ENCOUNTER — Ambulatory Visit: Payer: Medicare Other | Admitting: Podiatry

## 2014-11-19 ENCOUNTER — Encounter: Payer: Self-pay | Admitting: Podiatry

## 2014-11-19 NOTE — Progress Notes (Signed)
Patient ID: Henry Curtis, male   DOB: Apr 02, 1922, 81 y.o.   MRN: [id number]  Subjective: 81 y.o.-year-old male returns the office today for painful, elongated, thickened toenails which she is unable to trim himself. Denies any redness or drainage around the nails. Denies any acute changes since last appointment and no new complaints today. Denies any systemic complaints such as fevers, chills, nausea, vomiting.   Objective: AAO 3, NAD DP/PT pulses palpable, CRT less than 3 seconds Protective sensation intact with Simms Weinstein monofilament, Achilles tendon reflex intact.  Nails hypertrophic, dystrophic, elongated, brittle, discolored 10. There is tenderness overlying these nails. There is no surrounding erythema or drainage along the nail sites. No open lesions or pre-ulcerative lesions are identified. No other areas of tenderness bilateral lower extremities. No overlying edema, erythema, increased warmth. No pain with calf compression, swelling, warmth, erythema.  Assessment: Patient presents with symptomatic onychomycosis  Plan: -Treatment options including alternatives, risks, complications were discussed -Nails sharply debrided 10 without complication/bleeding. -Discussed daily foot inspection. If there are any changes, to call the office immediately.  -Follow-up in 3 months or sooner if any problems are to arise. In the meantime, encouraged to call the office with any questions, concerns, changes symptoms.

## 2014-11-26 ENCOUNTER — Telehealth: Payer: Self-pay | Admitting: Cardiology

## 2014-11-26 NOTE — Telephone Encounter (Signed)
Closed encounter °

## 2014-12-06 ENCOUNTER — Other Ambulatory Visit: Payer: Self-pay | Admitting: Urology

## 2014-12-19 ENCOUNTER — Emergency Department (HOSPITAL_COMMUNITY)
Admission: EM | Admit: 2014-12-19 | Discharge: 2014-12-19 | Disposition: A | Discharge status: Home / Self Care | Payer: Medicare Other | Source: Home / Self Care | Attending: Emergency Medicine | Admitting: Emergency Medicine

## 2014-12-19 ENCOUNTER — Encounter (HOSPITAL_COMMUNITY): Payer: Self-pay | Admitting: Emergency Medicine

## 2014-12-19 DIAGNOSIS — N390 Urinary tract infection, site not specified: Secondary | ICD-10-CM | POA: Diagnosis present

## 2014-12-19 DIAGNOSIS — C61 Malignant neoplasm of prostate: Secondary | ICD-10-CM | POA: Diagnosis present

## 2014-12-19 DIAGNOSIS — Z87891 Personal history of nicotine dependence: Secondary | ICD-10-CM | POA: Insufficient documentation

## 2014-12-19 DIAGNOSIS — I252 Old myocardial infarction: Secondary | ICD-10-CM | POA: Insufficient documentation

## 2014-12-19 DIAGNOSIS — Z79899 Other long term (current) drug therapy: Secondary | ICD-10-CM | POA: Insufficient documentation

## 2014-12-19 DIAGNOSIS — Z87438 Personal history of other diseases of male genital organs: Secondary | ICD-10-CM | POA: Insufficient documentation

## 2014-12-19 DIAGNOSIS — T83091A Other mechanical complication of indwelling urethral catheter, initial encounter: Secondary | ICD-10-CM

## 2014-12-19 DIAGNOSIS — I10 Essential (primary) hypertension: Secondary | ICD-10-CM | POA: Diagnosis present

## 2014-12-19 DIAGNOSIS — R310 Gross hematuria: Secondary | ICD-10-CM | POA: Diagnosis present

## 2014-12-19 DIAGNOSIS — Z7982 Long term (current) use of aspirin: Secondary | ICD-10-CM

## 2014-12-19 DIAGNOSIS — Z951 Presence of aortocoronary bypass graft: Secondary | ICD-10-CM

## 2014-12-19 DIAGNOSIS — R338 Other retention of urine: Secondary | ICD-10-CM | POA: Diagnosis not present

## 2014-12-19 DIAGNOSIS — R339 Retention of urine, unspecified: Secondary | ICD-10-CM | POA: Diagnosis present

## 2014-12-19 DIAGNOSIS — I2510 Atherosclerotic heart disease of native coronary artery without angina pectoris: Secondary | ICD-10-CM | POA: Diagnosis present

## 2014-12-19 DIAGNOSIS — Z7902 Long term (current) use of antithrombotics/antiplatelets: Secondary | ICD-10-CM

## 2014-12-19 DIAGNOSIS — T83098A Other mechanical complication of other indwelling urethral catheter, initial encounter: Secondary | ICD-10-CM

## 2014-12-19 DIAGNOSIS — Z923 Personal history of irradiation: Secondary | ICD-10-CM

## 2014-12-19 DIAGNOSIS — N320 Bladder-neck obstruction: Secondary | ICD-10-CM | POA: Diagnosis not present

## 2014-12-19 DIAGNOSIS — N359 Urethral stricture, unspecified: Secondary | ICD-10-CM | POA: Diagnosis present

## 2014-12-19 DIAGNOSIS — Y848 Other medical procedures as the cause of abnormal reaction of the patient, or of later complication, without mention of misadventure at the time of the procedure: Secondary | ICD-10-CM | POA: Insufficient documentation

## 2014-12-19 DIAGNOSIS — F0390 Unspecified dementia without behavioral disturbance: Secondary | ICD-10-CM | POA: Diagnosis present

## 2014-12-19 LAB — CBC WITH DIFFERENTIAL/PLATELET
BASOS ABS: 0 10*3/uL (ref 0.0–0.1)
Basophils Relative: 0 % (ref 0–1)
Eosinophils Absolute: 0.2 10*3/uL (ref 0.0–0.7)
Eosinophils Relative: 2 % (ref 0–5)
HCT: 35.4 % — ABNORMAL LOW (ref 39.0–52.0)
Hemoglobin: 12 g/dL — ABNORMAL LOW (ref 13.0–17.0)
LYMPHS ABS: 1.6 10*3/uL (ref 0.7–4.0)
LYMPHS PCT: 19 % (ref 12–46)
MCH: 31.3 pg (ref 26.0–34.0)
MCHC: 33.9 g/dL (ref 30.0–36.0)
MCV: 92.4 fL (ref 78.0–100.0)
Monocytes Absolute: 1.2 10*3/uL — ABNORMAL HIGH (ref 0.1–1.0)
Monocytes Relative: 14 % — ABNORMAL HIGH (ref 3–12)
NEUTROS ABS: 5.4 10*3/uL (ref 1.7–7.7)
Neutrophils Relative %: 65 % (ref 43–77)
Platelets: 150 10*3/uL (ref 150–400)
RBC: 3.83 MIL/uL — ABNORMAL LOW (ref 4.22–5.81)
RDW: 13.5 % (ref 11.5–15.5)
WBC: 8.3 10*3/uL (ref 4.0–10.5)

## 2014-12-19 LAB — I-STAT CHEM 8, ED
BUN: 28 mg/dL — ABNORMAL HIGH (ref 6–20)
CREATININE: 1.1 mg/dL (ref 0.61–1.24)
Calcium, Ion: 1.26 mmol/L (ref 1.13–1.30)
Chloride: 109 mmol/L (ref 101–111)
Glucose, Bld: 124 mg/dL — ABNORMAL HIGH (ref 65–99)
HEMATOCRIT: 36 % — AB (ref 39.0–52.0)
HEMOGLOBIN: 12.2 g/dL — AB (ref 13.0–17.0)
POTASSIUM: 4.4 mmol/L (ref 3.5–5.1)
SODIUM: 144 mmol/L (ref 135–145)
TCO2: 20 mmol/L (ref 0–100)

## 2014-12-19 NOTE — ED Provider Notes (Signed)
CSN: [id number]     Arrival date & time 12/19/14  1832 History   First MD Initiated Contact with Patient 12/19/14 1847     Chief Complaint  Patient presents with  . Abdominal Distention   . Foley Bleeding     History is obtained from patient's sister and home health care worker who accompanies him patient with poor memory (Consider location/radiation/quality/duration/timing/severity/associated sxs/prior Treatment) HPI Patient's Foley noted to be blocked 3.5 hours ago. Patient feels his abdomen is distended. His home health worker attempted to irrigate his Foley, without success. No other associated symptoms. He feels a vague pressure in his abdomen. Patient has chronic indwelling Foley . His sister reports that he has scar tissue from prior prostate cancer. Followed by [facility]. No other associated symptoms. Past Medical History  Diagnosis Date  . Prostate cancer   . Hypertension   . Myocardial infarction   . Coronary artery disease involving native coronary artery 12/1989    Unstable Angina  . S/P CABG x 3 12/27/1989    LIMA-D1-LAD, SVG-rPDA (PDA Endarterectomy & patch andioplasty)    Past Surgical History  Procedure Laterality Date  . Appendectomy    . Multiple tooth extractions Bilateral   . Coronary artery bypass graft    . Left heart catheterization with coronary/graft angiogram N/A 06/25/2014    Procedure: LEFT HEART CATHETERIZATION WITH Beatrix Fetters;  Surgeon: Leonie Man, MD;  Location: [facility] CATH LAB;  Service: Cardiovascular;  Laterality: N/A;   Family History  Problem Relation Age of Onset  . Diabetes Father   . Deep vein thrombosis Father    History  Substance Use Topics  . Smoking status: Former Research scientist (life sciences)  . Smokeless tobacco: Not on file  . Alcohol Use: No    Review of Systems  Unable to perform ROS Gastrointestinal: Positive for abdominal distention.  Genitourinary: Positive for difficulty urinating.       Chronic indwelling Foley   unable  to perform complete review of systems, patient has chronic poor memory    Allergies  Review of patient's allergies indicates no known allergies.  Home Medications   Prior to Admission medications   Medication Sig Start Date End Date Taking? Authorizing Provider  aspirin EC 325 MG EC tablet Take 1 tablet (325 mg total) by mouth daily. 06/29/14   [name], MD  atorvastatin (LIPITOR) 80 MG tablet Take 1 tablet (80 mg total) by mouth daily at 6 PM. 06/29/14   [name], MD  clopidogrel (PLAVIX) 75 MG tablet Take 1 tablet (75 mg total) by mouth daily with breakfast. 06/29/14   [name], MD  doxycycline (VIBRA-TABS) 100 MG tablet Take 100 mg by mouth 2 (two) times daily. 10/27/14   Historical Provider, MD  feeding supplement, ENSURE COMPLETE, (ENSURE COMPLETE) LIQD Take 237 mLs by mouth daily at 3 pm. 06/29/14   [name], MD  Lactobacillus-Inulin ([place] PO) Take 1 tablet by mouth daily.    Historical Provider, MD  losartan (COZAAR) 100 MG tablet Take 100 mg by mouth daily.    Historical Provider, MD  metoprolol tartrate (LOPRESSOR) 12.5 mg TABS tablet Take 0.5 tablets (12.5 mg total) by mouth 2 (two) times daily. 06/29/14   [name], MD  nitroGLYCERIN (NITROSTAT) 0.4 MG SL tablet Place 1 tablet (0.4 mg total) under the tongue every 5 (five) minutes as needed for chest pain. 07/27/14   Lelon Perla, MD  saccharomyces boulardii (FLORASTOR) 250 MG capsule Take 1 capsule (250 mg total) by  mouth 2 (two) times daily. Patient not taking: Reported on 10/31/2014 06/29/14   [name], MD  tamsulosin (FLOMAX) 0.4 MG CAPS capsule Take 0.4 mg by mouth daily.    Historical Provider, MD   BP 146/56 mmHg  Pulse 59  Temp(Src) 97.9 F (36.6 C) (Oral)  Resp 18  SpO2 100% Physical Exam  Constitutional: He appears well-developed and well-nourished.  HENT:  Head: Normocephalic and atraumatic.  Eyes: Conjunctivae are normal. Pupils are equal, round, and  reactive to light.  Neck: Neck supple. No tracheal deviation present. No thyromegaly present.  Cardiovascular: Normal rate and regular rhythm.   No murmur heard. Pulmonary/Chest: Effort normal and breath sounds normal.  Abdominal: Soft. Bowel sounds are normal. He exhibits no distension. There is no tenderness.  Genitourinary: Penis normal.  Foley in place  Musculoskeletal: Normal range of motion. He exhibits no edema or tenderness.  Neurological: He is alert. Coordination normal.  Skin: Skin is warm and dry. No rash noted.  Psychiatric: He has a normal mood and affect.  Nursing note and vitals reviewed.   ED Course  Procedures (including critical care time) Nursing irrigated Foley catheter without difficulty and Foley obstruction is resolved. Grossly bloody urine returned after Foley catheter irrigated  After Foley irrigation 440 mL grossly bloody urine with put the Foley bag. Bladder scan showed 99 mL urine retained in bladder. Labs Review Labs Reviewed - No data to display  Imaging Review No results found.   EKG Interpretation None     8:30 PM patient asymptomatic alert no distress Results for orders placed or performed during the hospital encounter of 12/19/14  CBC with Differential/Platelet  Result Value Ref Range   WBC 8.3 4.0 - 10.5 K/uL   RBC 3.83 (L) 4.22 - 5.81 MIL/uL   Hemoglobin 12.0 (L) 13.0 - 17.0 g/dL   HCT 35.4 (L) 39.0 - 52.0 %   MCV 92.4 78.0 - 100.0 fL   MCH 31.3 26.0 - 34.0 pg   MCHC 33.9 30.0 - 36.0 g/dL   RDW 13.5 11.5 - 15.5 %   Platelets 150 150 - 400 K/uL   Neutrophils Relative % 65 43 - 77 %   Neutro Abs 5.4 1.7 - 7.7 K/uL   Lymphocytes Relative 19 12 - 46 %   Lymphs Abs 1.6 0.7 - 4.0 K/uL   Monocytes Relative 14 (H) 3 - 12 %   Monocytes Absolute 1.2 (H) 0.1 - 1.0 K/uL   Eosinophils Relative 2 0 - 5 %   Eosinophils Absolute 0.2 0.0 - 0.7 K/uL   Basophils Relative 0 0 - 1 %   Basophils Absolute 0.0 0.0 - 0.1 K/uL  I-Stat Chem 8, ED   Result Value Ref Range   Sodium 144 135 - 145 mmol/L   Potassium 4.4 3.5 - 5.1 mmol/L   Chloride 109 101 - 111 mmol/L   BUN 28 (H) 6 - 20 mg/dL   Creatinine, Ser 1.10 0.61 - 1.24 mg/dL   Glucose, Bld 124 (H) 65 - 99 mg/dL   Calcium, Ion 1.26 1.13 - 1.30 mmol/L   TCO2 20 0 - 100 mmol/L   Hemoglobin 12.2 (L) 13.0 - 17.0 g/dL   HCT 36.0 (L) 39.0 - 52.0 %   No results found.  MDM  Spoke with Dr.Wrenn plan follow-up in office next week Diagnosis #1 Foley catheter obstruction #2 hematuria  Final diagnoses:  None        Orlie Dakin, MD 12/19/14 2038

## 2014-12-19 NOTE — ED Notes (Addendum)
Bladder scan amount showing 32ml. Pt has no complaints at this time. Pt and sister are asking if they can go home.

## 2014-12-19 NOTE — ED Notes (Signed)
Bed: WA09 Expected date:  Expected time:  Means of arrival:  Comments: EMS bleeding/ ab distention

## 2014-12-19 NOTE — Discharge Instructions (Signed)
Foley Catheter Care Call [facility] in 2 days to schedule an office appointment for this week. Return here if he developed dizziness fainting or feel worse for any reason A Foley catheter is a soft, flexible tube. This tube is placed into your bladder to drain pee (urine). If you go home with this catheter in place, follow the instructions below. TAKING CARE OF THE CATHETER 1. Wash your hands with soap and water. 2. Put soap and water on a clean washcloth.  Clean the skin where the tube goes into your body.  Clean away from the tube site.  Never wipe toward the tube.  Clean the area using a circular motion.  Remove all the soap. Pat the area dry with a clean towel. For males, reposition the skin that covers the end of the penis (foreskin). 3. Attach the tube to your leg with tape or a leg strap. Do not stretch the tube tight. If you are using tape, remove any stickiness left behind by past tape you used. 4. Keep the drainage bag below your hips. Keep it off the floor. 5. Check your tube during the day. Make sure it is working and draining. Make sure the tube does not curl, twist, or bend. 6. Do not pull on the tube or try to take it out. TAKING CARE OF THE DRAINAGE BAGS You will have a large overnight drainage bag and a small leg bag. You may wear the overnight bag any time. Never wear the small bag at night. Follow the directions below. Emptying the Drainage Bag Empty your drainage bag when it is  - full or at least 2-3 times a day. 1. Wash your hands with soap and water. 2. Keep the drainage bag below your hips. 3. Hold the dirty bag over the toilet or clean container. 4. Open the pour spout at the bottom of the bag. Empty the pee into the toilet or container. Do not let the pour spout touch anything. 5. Clean the pour spout with a gauze pad or cotton ball that has rubbing alcohol on it. 6. Close the pour spout. 7. Attach the bag to your leg with tape or a leg strap. 8. Wash  your hands well. Changing the Drainage Bag Change your bag once a month or sooner if it starts to smell or look dirty.  1. Wash your hands with soap and water. 2. Pinch the rubber tube so that pee does not spill out. 3. Disconnect the catheter tube from the drainage tube at the connection valve. Do not let the tubes touch anything. 4. Clean the end of the catheter tube with an alcohol wipe. Clean the end of a the drainage tube with a different alcohol wipe. 5. Connect the catheter tube to the drainage tube of the clean drainage bag. 6. Attach the new bag to the leg with tape or a leg strap. Avoid attaching the new bag too tightly. 7. Wash your hands well. Cleaning the Drainage Bag 1. Wash your hands with soap and water. 2. Wash the bag in warm, soapy water. 3. Rinse the bag with warm water. 4. Fill the bag with a mixture of white vinegar and water (1 cup vinegar to 1 quart warm water [.2 liter vinegar to 1 liter warm water]). Close the bag and soak it for 30 minutes in the solution. 5. Rinse the bag with warm water. 6. Hang the bag to dry with the pour spout open and hanging downward. 7. Store the clean bag (  once it is dry) in a clean plastic bag. 8. Wash your hands well. PREVENT INFECTION  Wash your hands before and after touching your tube.  Take showers every day. Wash the skin where the tube enters your body. Do not take baths. Replace wet leg straps with dry ones, if this applies.  Do not use powders, sprays, or lotions on the genital area. Only use creams, lotions, or ointments as told by your doctor.  For females, wipe from front to back after going to the bathroom.  Drink enough fluids to keep your pee clear or pale yellow unless you are told not to have too much fluid (fluid restriction).  Do not let the drainage bag or tubing touch or lie on the floor.  Wear cotton underwear to keep the area dry. GET HELP IF:  Your pee is cloudy or smells unusually bad.  Your tube  becomes clogged.  You are not draining pee into the bag or your bladder feels full.  Your tube starts to leak. GET HELP RIGHT AWAY IF:  You have pain, puffiness (swelling), redness, or yellowish-white fluid (pus) where the tube enters the body.  You have pain in the belly (abdomen), legs, lower back, or bladder.  You have a fever.  You see blood fill the tube, or your pee is pink or red.  You feel sick to your stomach (nauseous), throw up (vomit), or have chills.  Your tube gets pulled out. MAKE SURE YOU:   Understand these instructions.  Will watch your condition.  Will get help right away if you are not doing well or get worse. Document Released: 11/03/2012 Document Revised: 11/23/2013 Document Reviewed: 11/03/2012 [facility] Patient Information 2015 [place], [place]. This information is not intended to replace advice given to you by your health care provider. Make sure you discuss any questions you have with your health care provider.

## 2014-12-19 NOTE — ED Notes (Addendum)
Pt with Hx of Prostate Cancer and CABG surgery transported to ED for abdominal distention and bleeding in foley catheter. Some dried, brown, mucosal blood at tip of penis where foley catheter enters. No urine present in foley drainage bag, pt cannot remember last time he emptied bag due to chronic memory problems. Pt states his home health care-giver attempted to flush foley without success. 480 mL urine in bladder, according to bladder scanner.

## 2014-12-20 ENCOUNTER — Encounter (HOSPITAL_COMMUNITY): Payer: Self-pay | Admitting: Emergency Medicine

## 2014-12-20 ENCOUNTER — Emergency Department (HOSPITAL_COMMUNITY)
Admission: EM | Admit: 2014-12-20 | Discharge: 2014-12-20 | Disposition: A | Discharge status: Home / Self Care | Payer: Medicare Other | Source: Home / Self Care | Attending: Emergency Medicine | Admitting: Emergency Medicine

## 2014-12-20 DIAGNOSIS — R339 Retention of urine, unspecified: Secondary | ICD-10-CM

## 2014-12-20 DIAGNOSIS — Z951 Presence of aortocoronary bypass graft: Secondary | ICD-10-CM

## 2014-12-20 DIAGNOSIS — I252 Old myocardial infarction: Secondary | ICD-10-CM

## 2014-12-20 DIAGNOSIS — Z87891 Personal history of nicotine dependence: Secondary | ICD-10-CM | POA: Insufficient documentation

## 2014-12-20 DIAGNOSIS — Z9889 Other specified postprocedural states: Secondary | ICD-10-CM

## 2014-12-20 DIAGNOSIS — Z8546 Personal history of malignant neoplasm of prostate: Secondary | ICD-10-CM

## 2014-12-20 DIAGNOSIS — Y846 Urinary catheterization as the cause of abnormal reaction of the patient, or of later complication, without mention of misadventure at the time of the procedure: Secondary | ICD-10-CM

## 2014-12-20 DIAGNOSIS — I10 Essential (primary) hypertension: Secondary | ICD-10-CM

## 2014-12-20 DIAGNOSIS — Z7982 Long term (current) use of aspirin: Secondary | ICD-10-CM | POA: Insufficient documentation

## 2014-12-20 DIAGNOSIS — Z7901 Long term (current) use of anticoagulants: Secondary | ICD-10-CM

## 2014-12-20 DIAGNOSIS — T83018A Breakdown (mechanical) of other indwelling urethral catheter, initial encounter: Secondary | ICD-10-CM | POA: Insufficient documentation

## 2014-12-20 DIAGNOSIS — R319 Hematuria, unspecified: Secondary | ICD-10-CM

## 2014-12-20 DIAGNOSIS — I2510 Atherosclerotic heart disease of native coronary artery without angina pectoris: Secondary | ICD-10-CM

## 2014-12-20 DIAGNOSIS — R338 Other retention of urine: Secondary | ICD-10-CM

## 2014-12-20 LAB — I-STAT CHEM 8, ED
BUN: 31 mg/dL — ABNORMAL HIGH (ref 6–20)
Calcium, Ion: 1.18 mmol/L (ref 1.13–1.30)
Chloride: 110 mmol/L (ref 101–111)
Creatinine, Ser: 1.3 mg/dL — ABNORMAL HIGH (ref 0.61–1.24)
GLUCOSE: 195 mg/dL — AB (ref 65–99)
HCT: 38 % — ABNORMAL LOW (ref 39.0–52.0)
HEMOGLOBIN: 12.9 g/dL — AB (ref 13.0–17.0)
Potassium: 4.3 mmol/L (ref 3.5–5.1)
SODIUM: 144 mmol/L (ref 135–145)
TCO2: 18 mmol/L (ref 0–100)

## 2014-12-20 NOTE — ED Provider Notes (Signed)
CSN: [id number]     Arrival date & time 12/20/14  1452 History   First MD Initiated Contact with Patient 12/20/14 1516     Chief Complaint  Patient presents with  . foley clogged      (Consider location/radiation/quality/duration/timing/severity/associated sxs/prior Treatment) The history is provided by the patient.   patient presents with a plugged Foley catheter. Was seen last night for the same. Had some bleeding that began last night out of the catheter. States she was told that it was from blood at it clogged. . It was irrigated and the patient was discharged home. The Foley cath was reportedly changed earlier today at home also. States he has not had any drainage from it since it is been changed. He has abdominal pain. No nausea vomiting. No other bleeding. He has a history of scarring from prostate cancer. No fevers.  Past Medical History  Diagnosis Date  . Prostate cancer   . Hypertension   . Myocardial infarction   . Coronary artery disease involving native coronary artery 12/1989    Unstable Angina  . S/P CABG x 3 12/27/1989    LIMA-D1-LAD, SVG-rPDA (PDA Endarterectomy & patch andioplasty)    Past Surgical History  Procedure Laterality Date  . Appendectomy    . Multiple tooth extractions Bilateral   . Coronary artery bypass graft    . Left heart catheterization with coronary/graft angiogram N/A 06/25/2014    Procedure: LEFT HEART CATHETERIZATION WITH Beatrix Fetters;  Surgeon: Leonie Man, MD;  Location: [facility] CATH LAB;  Service: Cardiovascular;  Laterality: N/A;   Family History  Problem Relation Age of Onset  . Diabetes Father   . Deep vein thrombosis Father    History  Substance Use Topics  . Smoking status: Former Research scientist (life sciences)  . Smokeless tobacco: Not on file  . Alcohol Use: No    Review of Systems  Constitutional: Negative for fever and chills.  Respiratory: Negative for cough.   Gastrointestinal: Positive for abdominal pain. Negative for vomiting.   Genitourinary: Positive for decreased urine volume. Negative for flank pain.  Musculoskeletal: Negative for back pain.  Skin: Negative for wound.      Allergies  Review of patient's allergies indicates no known allergies.  Home Medications   Prior to Admission medications   Medication Sig Start Date End Date Taking? Authorizing Provider  aspirin EC 325 MG EC tablet Take 1 tablet (325 mg total) by mouth daily. 06/29/14  Yes [name], MD  atorvastatin (LIPITOR) 80 MG tablet Take 1 tablet (80 mg total) by mouth daily at 6 PM. 06/29/14  Yes [name], MD  clopidogrel (PLAVIX) 75 MG tablet Take 1 tablet (75 mg total) by mouth daily with breakfast. 06/29/14  Yes [name], MD  feeding supplement, ENSURE COMPLETE, (ENSURE COMPLETE) LIQD Take 237 mLs by mouth daily at 3 pm. 06/29/14  Yes [name], MD  losartan (COZAAR) 100 MG tablet Take 100 mg by mouth daily.   Yes Historical Provider, MD  metoprolol tartrate (LOPRESSOR) 12.5 mg TABS tablet Take 0.5 tablets (12.5 mg total) by mouth 2 (two) times daily. 06/29/14  Yes [name], MD  tamsulosin (FLOMAX) 0.4 MG CAPS capsule Take 0.4 mg by mouth daily.   Yes Historical Provider, MD  Lactobacillus-Inulin ([place] PO) Take 1 tablet by mouth daily.    Historical Provider, MD  nitroGLYCERIN (NITROSTAT) 0.4 MG SL tablet Place 1 tablet (0.4 mg total) under the tongue every 5 (five) minutes as needed for chest pain.  07/27/14   Lelon Perla, MD  saccharomyces boulardii (FLORASTOR) 250 MG capsule Take 1 capsule (250 mg total) by mouth 2 (two) times daily. Patient not taking: Reported on 10/31/2014 06/29/14   [name], MD   BP 140/72 mmHg  Pulse 73  Temp(Src) 98 F (36.7 C) (Oral)  Resp 17  SpO2 99% Physical Exam  Constitutional: He appears well-developed.  HENT:  Head: Atraumatic.  Eyes: Pupils are equal, round, and reactive to light.  Neck: Neck supple.  Cardiovascular: Regular rhythm.    Pulmonary/Chest: Effort normal.  Abdominal: He exhibits mass. There is tenderness.  Genitourinary: Penis normal.  14 French Foley catheter in place.  Musculoskeletal: Normal range of motion.  Neurological: He is alert.  Skin: Skin is warm.    ED Course  Procedures (including critical care time) Labs Review Labs Reviewed  I-STAT CHEM 8, ED - Abnormal; Notable for the following:    BUN 31 (*)    Creatinine, Ser 1.30 (*)    Glucose, Bld 195 (*)    Hemoglobin 12.9 (*)    HCT 38.0 (*)    All other components within normal limits  URINE CULTURE    Imaging Review No results found.   EKG Interpretation None      MDM   Final diagnoses:  Hematuria  Acute urinary retention    Patient with clogged Foley and hematuria. Sees urology. Seen   last night for same. Had a 63 French Foley in that was replaced something larger. Was irrigated. Now has been flowing freely. Will discharge. Hemoglobin was normal yesterday.   Davonna Belling, MD 12/21/14 Laureen Abrahams

## 2014-12-20 NOTE — ED Notes (Signed)
Bed: FA21 Expected date:  Expected time:  Means of arrival:  Comments: 15 m backed up foley (rm 14)

## 2014-12-20 NOTE — Discharge Instructions (Signed)
Your catheter had been plugged up. A larger one was placed. Follow-up with your urologist.  Hematuria Hematuria is blood in your urine. It can be caused by a bladder infection, kidney infection, prostate infection, kidney stone, or cancer of your urinary tract. Infections can usually be treated with medicine, and a kidney stone usually will pass through your urine. If neither of these is the cause of your hematuria, further workup to find out the reason may be needed. It is very important that you tell your health care provider about any blood you see in your urine, even if the blood stops without treatment or happens without causing pain. Blood in your urine that happens and then stops and then happens again can be a symptom of a very serious condition. Also, pain is not a symptom in the initial stages of many urinary cancers. HOME CARE INSTRUCTIONS   Drink lots of fluid, 3-4 quarts a day. If you have been diagnosed with an infection, cranberry juice is especially recommended, in addition to large amounts of water.  Avoid caffeine, tea, and carbonated beverages because they tend to irritate the bladder.  Avoid alcohol because it may irritate the prostate.  Take all medicines as directed by your health care provider.  If you were prescribed an antibiotic medicine, finish it all even if you start to feel better.  If you have been diagnosed with a kidney stone, follow your health care provider's instructions regarding straining your urine to catch the stone.  Empty your bladder often. Avoid holding urine for long periods of time.  After a bowel movement, women should cleanse front to back. Use each tissue only once.  Empty your bladder before and after sexual intercourse if you are a male. SEEK MEDICAL CARE IF:  You develop back pain.  You have a fever.  You have a feeling of sickness in your stomach (nausea) or vomiting.  Your symptoms are not better in 3 days. Return sooner if you  are getting worse. SEEK IMMEDIATE MEDICAL CARE IF:   You develop severe vomiting and are unable to keep the medicine down.  You develop severe back or abdominal pain despite taking your medicines.  You begin passing a large amount of blood or clots in your urine.  You feel extremely weak or faint, or you pass out. MAKE SURE YOU:   Understand these instructions.  Will watch your condition.  Will get help right away if you are not doing well or get worse. Document Released: 07/09/2005 Document Revised: 11/23/2013 Document Reviewed: 03/09/2013 [facility] Patient Information 2015 [place], [place]. This information is not intended to replace advice given to you by your health care provider. Make sure you discuss any questions you have with your health care provider.

## 2014-12-20 NOTE — ED Notes (Signed)
Patient has blocked foley cath. Bag has not been emptied all day per patient. Patient seen here last night for same. Patient states that hes uncomfortable feels like he need to pee. Vitals stable per EMS

## 2014-12-21 ENCOUNTER — Inpatient Hospital Stay (HOSPITAL_COMMUNITY): Payer: Medicare Other

## 2014-12-21 ENCOUNTER — Encounter (HOSPITAL_COMMUNITY): Payer: Self-pay | Admitting: *Deleted

## 2014-12-21 ENCOUNTER — Encounter (HOSPITAL_COMMUNITY)
Admission: EM | Disposition: A | Discharge status: Home / Self Care | Payer: Self-pay | Source: Home / Self Care | Attending: Urology

## 2014-12-21 ENCOUNTER — Inpatient Hospital Stay (HOSPITAL_COMMUNITY): Payer: Medicare Other | Admitting: Anesthesiology

## 2014-12-21 ENCOUNTER — Inpatient Hospital Stay (HOSPITAL_COMMUNITY)
Admission: EM | Admit: 2014-12-21 | Discharge: 2014-12-24 | DRG: 699 | Disposition: A | Discharge status: Home / Self Care | Payer: Medicare Other | Attending: Urology | Admitting: Urology

## 2014-12-21 DIAGNOSIS — Y848 Other medical procedures as the cause of abnormal reaction of the patient, or of later complication, without mention of misadventure at the time of the procedure: Secondary | ICD-10-CM | POA: Diagnosis present

## 2014-12-21 DIAGNOSIS — I252 Old myocardial infarction: Secondary | ICD-10-CM | POA: Diagnosis not present

## 2014-12-21 DIAGNOSIS — Z7982 Long term (current) use of aspirin: Secondary | ICD-10-CM | POA: Diagnosis not present

## 2014-12-21 DIAGNOSIS — I10 Essential (primary) hypertension: Secondary | ICD-10-CM | POA: Diagnosis present

## 2014-12-21 DIAGNOSIS — N359 Urethral stricture, unspecified: Secondary | ICD-10-CM | POA: Diagnosis present

## 2014-12-21 DIAGNOSIS — R339 Retention of urine, unspecified: Secondary | ICD-10-CM | POA: Diagnosis present

## 2014-12-21 DIAGNOSIS — F0390 Unspecified dementia without behavioral disturbance: Secondary | ICD-10-CM | POA: Diagnosis present

## 2014-12-21 DIAGNOSIS — R310 Gross hematuria: Secondary | ICD-10-CM | POA: Diagnosis present

## 2014-12-21 DIAGNOSIS — Z8546 Personal history of malignant neoplasm of prostate: Secondary | ICD-10-CM | POA: Diagnosis not present

## 2014-12-21 DIAGNOSIS — Y846 Urinary catheterization as the cause of abnormal reaction of the patient, or of later complication, without mention of misadventure at the time of the procedure: Secondary | ICD-10-CM | POA: Diagnosis present

## 2014-12-21 DIAGNOSIS — Z7902 Long term (current) use of antithrombotics/antiplatelets: Secondary | ICD-10-CM | POA: Diagnosis not present

## 2014-12-21 DIAGNOSIS — C61 Malignant neoplasm of prostate: Secondary | ICD-10-CM | POA: Diagnosis present

## 2014-12-21 DIAGNOSIS — Z7901 Long term (current) use of anticoagulants: Secondary | ICD-10-CM | POA: Diagnosis not present

## 2014-12-21 DIAGNOSIS — I2510 Atherosclerotic heart disease of native coronary artery without angina pectoris: Secondary | ICD-10-CM | POA: Diagnosis present

## 2014-12-21 DIAGNOSIS — Z79899 Other long term (current) drug therapy: Secondary | ICD-10-CM | POA: Diagnosis not present

## 2014-12-21 DIAGNOSIS — R319 Hematuria, unspecified: Secondary | ICD-10-CM | POA: Diagnosis present

## 2014-12-21 DIAGNOSIS — L8990 Pressure ulcer of unspecified site, unspecified stage: Secondary | ICD-10-CM | POA: Insufficient documentation

## 2014-12-21 DIAGNOSIS — T83098A Other mechanical complication of other indwelling urethral catheter, initial encounter: Secondary | ICD-10-CM | POA: Diagnosis present

## 2014-12-21 DIAGNOSIS — Z87438 Personal history of other diseases of male genital organs: Secondary | ICD-10-CM | POA: Diagnosis not present

## 2014-12-21 DIAGNOSIS — Z951 Presence of aortocoronary bypass graft: Secondary | ICD-10-CM | POA: Diagnosis not present

## 2014-12-21 DIAGNOSIS — Z923 Personal history of irradiation: Secondary | ICD-10-CM | POA: Diagnosis not present

## 2014-12-21 DIAGNOSIS — Z9889 Other specified postprocedural states: Secondary | ICD-10-CM | POA: Diagnosis not present

## 2014-12-21 DIAGNOSIS — N390 Urinary tract infection, site not specified: Secondary | ICD-10-CM | POA: Diagnosis present

## 2014-12-21 DIAGNOSIS — N320 Bladder-neck obstruction: Secondary | ICD-10-CM | POA: Diagnosis present

## 2014-12-21 DIAGNOSIS — R338 Other retention of urine: Secondary | ICD-10-CM | POA: Diagnosis present

## 2014-12-21 DIAGNOSIS — Z87891 Personal history of nicotine dependence: Secondary | ICD-10-CM | POA: Diagnosis not present

## 2014-12-21 DIAGNOSIS — T83018A Breakdown (mechanical) of other indwelling urethral catheter, initial encounter: Secondary | ICD-10-CM | POA: Diagnosis present

## 2014-12-21 HISTORY — PX: CYSTOSCOPY WITH URETHRAL DILATATION: SHX5125

## 2014-12-21 LAB — URINALYSIS, ROUTINE W REFLEX MICROSCOPIC
Glucose, UA: NEGATIVE mg/dL
Ketones, ur: 15 mg/dL — AB
Nitrite: POSITIVE — AB
Specific Gravity, Urine: 1.017 (ref 1.005–1.030)
UROBILINOGEN UA: 1 mg/dL (ref 0.0–1.0)
pH: 8 (ref 5.0–8.0)

## 2014-12-21 LAB — CBC WITH DIFFERENTIAL/PLATELET
BASOS ABS: 0 10*3/uL (ref 0.0–0.1)
Basophils Relative: 0 % (ref 0–1)
EOS PCT: 0 % (ref 0–5)
Eosinophils Absolute: 0 10*3/uL (ref 0.0–0.7)
HCT: 36.8 % — ABNORMAL LOW (ref 39.0–52.0)
HEMOGLOBIN: 12.6 g/dL — AB (ref 13.0–17.0)
Lymphocytes Relative: 6 % — ABNORMAL LOW (ref 12–46)
Lymphs Abs: 0.8 10*3/uL (ref 0.7–4.0)
MCH: 31.9 pg (ref 26.0–34.0)
MCHC: 34.2 g/dL (ref 30.0–36.0)
MCV: 93.2 fL (ref 78.0–100.0)
MONO ABS: 1 10*3/uL (ref 0.1–1.0)
MONOS PCT: 8 % (ref 3–12)
NEUTROS ABS: 10.1 10*3/uL — AB (ref 1.7–7.7)
Neutrophils Relative %: 86 % — ABNORMAL HIGH (ref 43–77)
Platelets: 163 10*3/uL (ref 150–400)
RBC: 3.95 MIL/uL — AB (ref 4.22–5.81)
RDW: 13.7 % (ref 11.5–15.5)
WBC: 11.9 10*3/uL — ABNORMAL HIGH (ref 4.0–10.5)

## 2014-12-21 LAB — URINE MICROSCOPIC-ADD ON

## 2014-12-21 LAB — BASIC METABOLIC PANEL
ANION GAP: 7 (ref 5–15)
BUN: 37 mg/dL — ABNORMAL HIGH (ref 6–20)
CHLORIDE: 111 mmol/L (ref 101–111)
CO2: 20 mmol/L — AB (ref 22–32)
CREATININE: 1.85 mg/dL — AB (ref 0.61–1.24)
Calcium: 9 mg/dL (ref 8.9–10.3)
GFR calc Af Amer: 35 mL/min — ABNORMAL LOW (ref >60)
GFR calc non Af Amer: 30 mL/min — ABNORMAL LOW (ref >60)
Glucose, Bld: 183 mg/dL — ABNORMAL HIGH (ref 65–99)
POTASSIUM: 3.9 mmol/L (ref 3.5–5.1)
Sodium: 138 mmol/L (ref 135–145)

## 2014-12-21 LAB — SURGICAL PCR SCREEN
MRSA, PCR: NEGATIVE
Staphylococcus aureus: NEGATIVE

## 2014-12-21 SURGERY — CYSTOSCOPY, WITH URETHRAL DILATION
Anesthesia: General

## 2014-12-21 MED ORDER — HYDROMORPHONE HCL 1 MG/ML IJ SOLN
0.5000 mg | INTRAMUSCULAR | Status: DC | PRN
  Administered 2014-12-21 (×2): 0.25 mg via INTRAVENOUS
  Administered 2014-12-21: 0.5 mg via INTRAVENOUS

## 2014-12-21 MED ORDER — HYDROCODONE-ACETAMINOPHEN 5-325 MG PO TABS
1.0000 | ORAL_TABLET | ORAL | Status: DC | PRN
  Administered 2014-12-22 (×2): 1 via ORAL
  Filled 2014-12-21 (×3): qty 1

## 2014-12-21 MED ORDER — HYDROMORPHONE HCL 1 MG/ML IJ SOLN
INTRAMUSCULAR | Status: AC
  Filled 2014-12-21: qty 1

## 2014-12-21 MED ORDER — SUCCINYLCHOLINE CHLORIDE 20 MG/ML IJ SOLN
INTRAMUSCULAR | Status: DC | PRN
  Administered 2014-12-21: 100 mg via INTRAVENOUS

## 2014-12-21 MED ORDER — HYDROMORPHONE HCL 1 MG/ML IJ SOLN: 0.5000 mg | INTRAMUSCULAR | Status: DC | PRN

## 2014-12-21 MED ORDER — SODIUM CHLORIDE 0.9 % IR SOLN
Status: DC | PRN
  Administered 2014-12-21: 1000 mL

## 2014-12-21 MED ORDER — ONDANSETRON HCL 4 MG/2ML IJ SOLN
INTRAMUSCULAR | Status: AC
  Filled 2014-12-21: qty 2

## 2014-12-21 MED ORDER — LACTATED RINGERS IV SOLN
INTRAVENOUS | Status: DC
  Administered 2014-12-21: 1000 mL via INTRAVENOUS

## 2014-12-21 MED ORDER — NITROGLYCERIN 0.4 MG SL SUBL: 0.4000 mg | SUBLINGUAL_TABLET | SUBLINGUAL | Status: DC | PRN

## 2014-12-21 MED ORDER — MORPHINE SULFATE 2 MG/ML IJ SOLN
2.0000 mg | Freq: Once | INTRAMUSCULAR | Status: AC
  Administered 2014-12-21: 2 mg via INTRAVENOUS
  Filled 2014-12-21: qty 1

## 2014-12-21 MED ORDER — ATORVASTATIN CALCIUM 80 MG PO TABS
80.0000 mg | ORAL_TABLET | Freq: Every day | ORAL | Status: DC
  Administered 2014-12-22 – 2014-12-23 (×2): 80 mg via ORAL
  Filled 2014-12-21 (×5): qty 1

## 2014-12-21 MED ORDER — FENTANYL CITRATE (PF) 250 MCG/5ML IJ SOLN
INTRAMUSCULAR | Status: DC | PRN
  Administered 2014-12-21 (×2): 25 ug via INTRAVENOUS

## 2014-12-21 MED ORDER — IOHEXOL 300 MG/ML  SOLN: 25.0000 mL | INTRAMUSCULAR | Status: AC

## 2014-12-21 MED ORDER — 0.9 % SODIUM CHLORIDE (POUR BTL) OPTIME
TOPICAL | Status: DC | PRN
  Administered 2014-12-21: 1000 mL

## 2014-12-21 MED ORDER — DEXAMETHASONE SODIUM PHOSPHATE 10 MG/ML IJ SOLN
INTRAMUSCULAR | Status: AC
  Filled 2014-12-21: qty 1

## 2014-12-21 MED ORDER — SENNA 8.6 MG PO TABS
1.0000 | ORAL_TABLET | Freq: Two times a day (BID) | ORAL | Status: DC
  Administered 2014-12-21 – 2014-12-24 (×6): 8.6 mg via ORAL
  Filled 2014-12-21 (×6): qty 1

## 2014-12-21 MED ORDER — FENTANYL CITRATE (PF) 100 MCG/2ML IJ SOLN
INTRAMUSCULAR | Status: AC
  Filled 2014-12-21: qty 2

## 2014-12-21 MED ORDER — SODIUM CHLORIDE 0.9 % IV SOLN
1.0000 g | Freq: Three times a day (TID) | INTRAVENOUS | Status: DC
  Administered 2014-12-21 – 2014-12-24 (×9): 1 g via INTRAVENOUS
  Filled 2014-12-21 (×11): qty 1000

## 2014-12-21 MED ORDER — ASPIRIN EC 325 MG PO TBEC
325.0000 mg | DELAYED_RELEASE_TABLET | Freq: Every day | ORAL | Status: DC
Start: 2014-12-21 — End: 2014-12-24
  Administered 2014-12-21 – 2014-12-24 (×4): 325 mg via ORAL
  Filled 2014-12-21 (×4): qty 1

## 2014-12-21 MED ORDER — PROPOFOL 10 MG/ML IV BOLUS
INTRAVENOUS | Status: AC
  Filled 2014-12-21: qty 20

## 2014-12-21 MED ORDER — IOHEXOL 300 MG/ML  SOLN
INTRAMUSCULAR | Status: DC | PRN
  Administered 2014-12-21: 10 mL

## 2014-12-21 MED ORDER — METOPROLOL TARTRATE 25 MG PO TABS
12.5000 mg | ORAL_TABLET | Freq: Two times a day (BID) | ORAL | Status: DC
  Administered 2014-12-21 – 2014-12-24 (×7): 12.5 mg via ORAL
  Filled 2014-12-21 (×7): qty 1

## 2014-12-21 MED ORDER — ONDANSETRON HCL 4 MG/2ML IJ SOLN
INTRAMUSCULAR | Status: DC | PRN
  Administered 2014-12-21: 4 mg via INTRAVENOUS

## 2014-12-21 MED ORDER — SODIUM CHLORIDE 0.9 % IR SOLN
Status: DC | PRN
  Administered 2014-12-21: 3000 mL

## 2014-12-21 MED ORDER — DEXAMETHASONE SODIUM PHOSPHATE 10 MG/ML IJ SOLN
INTRAMUSCULAR | Status: DC | PRN
  Administered 2014-12-21: 10 mg via INTRAVENOUS

## 2014-12-21 MED ORDER — SODIUM CHLORIDE 0.9 % IV SOLN
1.0000 g | Freq: Four times a day (QID) | INTRAVENOUS | Status: DC
  Filled 2014-12-21 (×2): qty 1000

## 2014-12-21 MED ORDER — CIPROFLOXACIN IN D5W 400 MG/200ML IV SOLN
400.0000 mg | INTRAVENOUS | Status: DC
  Administered 2014-12-21 – 2014-12-22 (×2): 400 mg via INTRAVENOUS
  Filled 2014-12-21 (×2): qty 200

## 2014-12-21 MED ORDER — ONDANSETRON HCL 4 MG/2ML IJ SOLN: 4.0000 mg | Freq: Once | INTRAMUSCULAR | Status: DC | PRN

## 2014-12-21 MED ORDER — KCL IN DEXTROSE-NACL 20-5-0.45 MEQ/L-%-% IV SOLN
INTRAVENOUS | Status: DC
  Administered 2014-12-21 – 2014-12-23 (×3): via INTRAVENOUS
  Filled 2014-12-21 (×4): qty 1000

## 2014-12-21 MED ORDER — LOSARTAN POTASSIUM 50 MG PO TABS
100.0000 mg | ORAL_TABLET | Freq: Every day | ORAL | Status: DC
  Administered 2014-12-21 – 2014-12-24 (×4): 100 mg via ORAL
  Filled 2014-12-21 (×5): qty 2

## 2014-12-21 MED ORDER — PROPOFOL 10 MG/ML IV BOLUS
INTRAVENOUS | Status: DC | PRN
  Administered 2014-12-21: 25 mg via INTRAVENOUS
  Administered 2014-12-21: 100 mg via INTRAVENOUS

## 2014-12-21 MED ORDER — KCL IN DEXTROSE-NACL 20-5-0.45 MEQ/L-%-% IV SOLN
INTRAVENOUS | Status: DC
  Administered 2014-12-21: 12:00:00 via INTRAVENOUS
  Filled 2014-12-21 (×3): qty 1000

## 2014-12-21 SURGICAL SUPPLY — 13 items
BAG URO CATCHER STRL LF (DRAPE) ×2 IMPLANT
BASKET LASER NITINOL 1.9FR (BASKET) IMPLANT
BSKT STON RTRVL 120 1.9FR (BASKET)
CATH HEMA 3WAY 30CC 22FR COUDE (CATHETERS) ×1 IMPLANT
CATH INTERMIT  6FR 70CM (CATHETERS) IMPLANT
GLOVE BIOGEL M STRL SZ7.5 (GLOVE) ×2 IMPLANT
GOWN STRL REUS W/TWL LRG LVL3 (GOWN DISPOSABLE) ×4 IMPLANT
GUIDEWIRE ANG ZIPWIRE 038X150 (WIRE) IMPLANT
GUIDEWIRE STR DUAL SENSOR (WIRE) ×2 IMPLANT
MANIFOLD NEPTUNE II (INSTRUMENTS) ×2 IMPLANT
PACK CYSTO (CUSTOM PROCEDURE TRAY) ×2 IMPLANT
TUBE FEEDING 8FR 16IN STR KANG (MISCELLANEOUS) ×2 IMPLANT
TUBING CONNECTING 10 (TUBING) ×2 IMPLANT

## 2014-12-21 NOTE — Progress Notes (Signed)
Pt is a DNR at home, and currently Full Code status upon admission. MD Tammi Klippel made aware of this and will change code status. Last spoke to MD [id number].  Rosine Beat, RN

## 2014-12-21 NOTE — Plan of Care (Signed)
Problem: Phase I Progression Outcomes Goal: Voiding-avoid urinary catheter unless indicated Outcome: Not Applicable Date Met:  12/21/14 Chronic catheter     

## 2014-12-21 NOTE — ED Notes (Signed)
Urology came and irrigated existing foley. Foley now draining. Plan to have procedure later today

## 2014-12-21 NOTE — Progress Notes (Signed)
Family stated that patient had a reaction to anesthesia post op after his cardiac surgery at [facility] in December.  Son stated that the patient became very violent post op and it took 4 plus nurses to hold him down. MD paged with these findings and reported by MDs nurse to write progress note for anesthesia.

## 2014-12-21 NOTE — ED Notes (Signed)
Per EMS pt from home, pt has a foley catheter problem, pt is stating he feels pressure and pain, EMS is unsure of problem, pt has been here 3 days in a row.

## 2014-12-21 NOTE — Anesthesia Postprocedure Evaluation (Signed)
  Anesthesia Post-op Note  Patient: Henry Curtis  Procedure(s) Performed: Procedure(s): CYSTOSCOPY WITH RETROGRADE AND URETHRAL DILATATION  (N/A)  Patient Location: PACU  Anesthesia Type:General  Level of Consciousness: awake, alert  and oriented  Airway and Oxygen Therapy: Patient Spontanous Breathing  Post-op Pain: mild  Post-op Assessment: Post-op Vital signs reviewed, Patient's Cardiovascular Status Stable, Respiratory Function Stable, Patent Airway, No signs of Nausea or vomiting and Pain level controlled  Post-op Vital Signs: Reviewed and stable  Last Vitals:  Filed Vitals:   12/21/14 2038  BP: 140/61  Pulse: 71  Temp: 36.5 C  Resp: 14    Complications: No apparent anesthesia complications

## 2014-12-21 NOTE — H&P (Signed)
Henry Curtis is an 81 y.o. male.    Chief Complaint: Recurent Urinary Retention   HPI:   1 - Urethral Stricture / Recurrent Urinary Retention - Pt with known high grade membranous stricture since 06/2014 managed with chronic foley as cold not do CIC. Plan for formal operative dialtion 6/27 to upsize but has had recurrent retention from small clots over past few weeks.   2 - Gross hematuria - New gross hematuria 11/2014. H/o prostate radiaiton. No recent imaging.   3 - Prostate Cancer s/p Radiation - s/p radiotherapy years ago for unknown grade / stage disease. Some known PSA recurrence but has elected surveillane giveon overall poor health.  4 - Bacteruria - known chronic bacteruria with foley. Most recent UCX 10/2014 enterococcus sens levoquin, amp.   PMH sig for CAD,CABH, NSTEMI/Stent 2015 (plavix, follows [facility]), Sig memeory loss (family helps with meds).  Today Henry Curtis is seen as ER admission for above. This is his 3rd ER visit in several days for clogged foley.   Past Medical History  Diagnosis Date  . Prostate cancer   . Hypertension   . Myocardial infarction   . Coronary artery disease involving native coronary artery 12/1989    Unstable Angina  . S/P CABG x 3 12/27/1989    LIMA-D1-LAD, SVG-rPDA (PDA Endarterectomy & patch andioplasty)     Past Surgical History  Procedure Laterality Date  . Appendectomy    . Multiple tooth extractions Bilateral   . Coronary artery bypass graft    . Left heart catheterization with coronary/graft angiogram N/A 06/25/2014    Procedure: LEFT HEART CATHETERIZATION WITH Beatrix Fetters;  Surgeon: Leonie Man, MD;  Location: [facility] CATH LAB;  Service: Cardiovascular;  Laterality: N/A;    Family History  Problem Relation Age of Onset  . Diabetes Father   . Deep vein thrombosis Father    Social History:  reports that he has quit smoking. He does not have any smokeless tobacco history on file. He reports that he does not drink alcohol or  use illicit drugs.  Allergies: No Known Allergies   (Not in a hospital admission)  Results for orders placed or performed during the hospital encounter of 12/20/14 (from the past 48 hour(s))  I-Stat Chem 8, ED     Status: Abnormal   Collection Time: 12/20/14  4:11 PM  Result Value Ref Range   Sodium 144 135 - 145 mmol/L   Potassium 4.3 3.5 - 5.1 mmol/L   Chloride 110 101 - 111 mmol/L   BUN 31 (H) 6 - 20 mg/dL   Creatinine, Ser 1.30 (H) 0.61 - 1.24 mg/dL   Glucose, Bld 195 (H) 65 - 99 mg/dL   Calcium, Ion 1.18 1.13 - 1.30 mmol/L   TCO2 18 0 - 100 mmol/L   Hemoglobin 12.9 (L) 13.0 - 17.0 g/dL   HCT 38.0 (L) 39.0 - 52.0 %   No results found.  Review of Systems  Constitutional: Positive for malaise/fatigue.  HENT: Negative.   Eyes: Negative.   Cardiovascular: Negative.   Gastrointestinal: Negative.   Genitourinary: Positive for hematuria.  Musculoskeletal: Negative.   Skin: Negative.   Neurological: Negative.  Negative for loss of consciousness.  Endo/Heme/Allergies: Bruises/bleeds easily.  Psychiatric/Behavioral: Positive for memory loss.    Blood pressure 149/53, pulse 70, temperature 98.9 F (37.2 C), temperature source Oral, resp. rate 16, SpO2 93 %. Physical Exam  Constitutional: He appears well-developed and well-nourished.  In ER 23 at [place]. Son at bedside  HENT:  Head: Normocephalic.  Eyes: Pupils are equal, round, and reactive to light.  Neck: Normal range of motion.  Cardiovascular: Normal rate.   Respiratory: Effort normal.  GI: Soft.  Genitourinary:  45F foley in situe with dark urine and some SP TTP. Irrigated 500cc dark urien with small rusty old-appearing clots then NS to clear and reconnected to straight drian.   Musculoskeletal: Normal range of motion.  Neurological: He is alert.  Sig memory loss AOx2.   Skin: Skin is warm.  Diffuse extremity bruising c/w plavix use.   Psychiatric: He has a normal mood and affect.     Assessment/Plan  1 -  Urethral Stricture / Recurrent Urinary Retention - Pt needs formal operative dilation with larger foley placement. He is on plavix with recent MI / Stent. Suggest proceed with operative cysto / retrogrades / balloon dilation today w/o any "incisions" given plavix use. I feel this is acceptable risk balance as he is in out of ER for recurrent reteniton. Risks, benefits, alternatives discussed with pt and family.   Will proceed alter today as add-on. Admit.  2 - Gross hematuria - non-con CT abd-pelvis today and cysto / retrogrades in OR as per above.   3 - Prostate Cancer s/p Radiation - s/p radiation. Imaging as per above to r/o sig progression.   4 - Bacteruria - will place on amp + Cipro for double coverage for now as going to be instrumented.    Henry Curtis 12/21/2014, 7:50 AM

## 2014-12-21 NOTE — ED Notes (Signed)
Per family and pt, pt has been here past 3 days for catheter not draining/urinary retention, states yesterday had catheter changed but when got home stopped draining, did bladder scanner and showed 347ml in bladder, pt is distended and tender, states he has pain and pressure, foley catheter was irrigated, irrigated w/o difficulty but pain for pt so stopped.

## 2014-12-21 NOTE — Transfer of Care (Signed)
Immediate Anesthesia Transfer of Care Note  Patient: Henry Curtis  Procedure(s) Performed: Procedure(s): CYSTOSCOPY WITH RETROGRADE AND URETHRAL DILATATION  (N/A)  Patient Location: PACU  Anesthesia Type:General  Level of Consciousness: sedated and patient cooperative  Airway & Oxygen Therapy: Patient Spontanous Breathing and Patient connected to face mask oxygen  Post-op Assessment: Report given to RN and Post -op Vital signs reviewed and stable  Post vital signs: Reviewed and stable  Last Vitals:  Filed Vitals:   12/21/14 1557  BP: 131/49  Pulse: 59  Temp: 36.6 C  Resp: 16    Complications: No apparent anesthesia complications

## 2014-12-21 NOTE — Care Management Note (Signed)
Case Management Note  Patient Details  Name: Henry Curtis MRN: [id number] Date of Birth: 04-30-1922  Subjective/Objective:   82 y/o m admitted w/Recurrent urine retention,gross hematuria.                 Action/Plan:Current d/c plan home.   Expected Discharge Date:                  Expected Discharge Plan:  Home/Self Care  In-House Referral:     Discharge planning Services  CM Consult  Post Acute Care Choice:    Choice offered to:     DME Arranged:    DME Agency:     HH Arranged:    HH Agency:     Status of Service:  In process, will continue to follow  Medicare Important Message Given:    Date Medicare IM Given:    Medicare IM give by:    Date Additional Medicare IM Given:    Additional Medicare Important Message give by:     If discussed at [place] of Stay Meetings, dates discussed:    Additional Comments:  Dessa Phi, RN 12/21/2014, 1:07 PM

## 2014-12-21 NOTE — ED Notes (Signed)
Bed: [facility] Expected date: 12/21/14 Expected time: 5:56 AM Means of arrival: Ambulance Comments: 81 yo M  Catheter problems

## 2014-12-21 NOTE — ED Provider Notes (Signed)
CSN: [id number]     Arrival date & time 12/21/14  0607 History   First MD Initiated Contact with Patient 12/21/14 0701     Chief Complaint  Patient presents with  . foley catheter problem      (Consider location/radiation/quality/duration/timing/severity/associated sxs/prior Treatment) HPI Comments: 81 year old male with coronary artery disease, heart attack, renal failure, urinary retention, Foley dependent since January presents with recurrent urinary retention. Patient is been seen the past 2 days in the ER.  Patient has had prostate seeding in his 67s, with his dementia history he is unsure of any other prostate difficulties. Patient has had this in the past. History is assisted by his son. Nothing is improved his symptoms gradually worsened overnight. No fevers or chills no change in mental status.   The history is provided by the patient.    Past Medical History  Diagnosis Date  . Prostate cancer   . Hypertension   . Myocardial infarction   . Coronary artery disease involving native coronary artery 12/1989    Unstable Angina  . S/P CABG x 3 12/27/1989    LIMA-D1-LAD, SVG-rPDA (PDA Endarterectomy & patch andioplasty)    Past Surgical History  Procedure Laterality Date  . Appendectomy    . Multiple tooth extractions Bilateral   . Coronary artery bypass graft    . Left heart catheterization with coronary/graft angiogram N/A 06/25/2014    Procedure: LEFT HEART CATHETERIZATION WITH Beatrix Fetters;  Surgeon: Leonie Man, MD;  Location: [facility] CATH LAB;  Service: Cardiovascular;  Laterality: N/A;   Family History  Problem Relation Age of Onset  . Diabetes Father   . Deep vein thrombosis Father    History  Substance Use Topics  . Smoking status: Former Research scientist (life sciences)  . Smokeless tobacco: Not on file  . Alcohol Use: No    Review of Systems  Constitutional: Negative for fever and chills.  HENT: Negative for congestion.   Eyes: Negative for visual disturbance.   Respiratory: Negative for shortness of breath.   Cardiovascular: Negative for chest pain.  Gastrointestinal: Negative for vomiting and abdominal pain.  Genitourinary: Positive for difficulty urinating. Negative for dysuria and flank pain.  Musculoskeletal: Negative for back pain, neck pain and neck stiffness.  Skin: Negative for rash.  Neurological: Negative for light-headedness and headaches.      Allergies  Review of patient's allergies indicates no known allergies.  Home Medications   Prior to Admission medications   Medication Sig Start Date End Date Taking? Authorizing Provider  aspirin EC 325 MG EC tablet Take 1 tablet (325 mg total) by mouth daily. 06/29/14  Yes [name], MD  atorvastatin (LIPITOR) 80 MG tablet Take 1 tablet (80 mg total) by mouth daily at 6 PM. 06/29/14  Yes [name], MD  clopidogrel (PLAVIX) 75 MG tablet Take 1 tablet (75 mg total) by mouth daily with breakfast. 06/29/14  Yes [name], MD  feeding supplement, ENSURE COMPLETE, (ENSURE COMPLETE) LIQD Take 237 mLs by mouth daily at 3 pm. 06/29/14  Yes [name], MD  Lactobacillus-Inulin (CULTURELLE DIGESTIVE HEALTH PO) Take 1 tablet by mouth daily.   Yes Historical Provider, MD  losartan (COZAAR) 100 MG tablet Take 100 mg by mouth daily.   Yes Historical Provider, MD  metoprolol tartrate (LOPRESSOR) 12.5 mg TABS tablet Take 0.5 tablets (12.5 mg total) by mouth 2 (two) times daily. 06/29/14  Yes [name], MD  nitroGLYCERIN (NITROSTAT) 0.4 MG SL tablet Place 1 tablet (0.4 mg total) under the tongue every  5 (five) minutes as needed for chest pain. 07/27/14  Yes Lelon Perla, MD  tamsulosin (FLOMAX) 0.4 MG CAPS capsule Take 0.4 mg by mouth daily.   Yes Historical Provider, MD  saccharomyces boulardii (FLORASTOR) 250 MG capsule Take 1 capsule (250 mg total) by mouth 2 (two) times daily. Patient not taking: Reported on 10/31/2014 06/29/14   [name], MD   BP 149/53 mmHg  Pulse 70   Temp(Src) 98.9 F (37.2 C) (Oral)  Resp 16  SpO2 93% Physical Exam  Constitutional: He appears well-developed and well-nourished.  HENT:  Head: Normocephalic and atraumatic.  Eyes: Conjunctivae are normal. Right eye exhibits no discharge. Left eye exhibits no discharge.  Neck: Normal range of motion. Neck supple. No tracheal deviation present.  Cardiovascular: Normal rate and regular rhythm.   Pulmonary/Chest: Effort normal and breath sounds normal.  Abdominal: Soft. He exhibits no distension. There is tenderness (suprapubic tenderness and fullness). There is no guarding.  Musculoskeletal: He exhibits no edema.  Neurological: He is alert.  Pleasant dementia  Skin: Skin is warm. No rash noted.  Psychiatric: He has a normal mood and affect.  Nursing note and vitals reviewed.   ED Course  Procedures (including critical care time) Limited Ultrasound of bladder  Performed by Dr. Reather Converse Indication: to assess for urinary retention and/or bladder volume prior to urinary catheter Technique:  Low frequency probe utilized in two planes to assess bladder volume in real-time. Findings: Bladder volume 500 Additional findings: foley bulb Images were archived electronically  Labs Review Labs Reviewed  BASIC METABOLIC PANEL  CBC WITH DIFFERENTIAL/PLATELET  URINALYSIS, ROUTINE W REFLEX MICROSCOPIC (NOT AT [facility])    Imaging Review No results found.   EKG Interpretation None      MDM   Final diagnoses:  Acute urinary retention   Patient presents with recurrent urinary retention. Third visit in 3 days to the ER. Plan for bladder irrigation and urology consult. Urology evaluate the patient at bedside and with recurrent symptoms and worsening symptoms and history of needing dilation plan for admission for procedure. Patient is on Plavix. Bedside ultrasound showed 500 mL on arrival.  The patients results and plan were reviewed and discussed.   Any x-rays performed were personally  reviewed by myself.   Differential diagnosis were considered with the presenting HPI.  Medications  morphine 2 MG/ML injection 2 mg (2 mg Intravenous Given 12/21/14 0739)    Filed Vitals:   12/21/14 0616  BP: 149/53  Pulse: 70  Temp: 98.9 F (37.2 C)  TempSrc: Oral  Resp: 16  SpO2: 93%    Final diagnoses:  Acute urinary retention    Admission/ observation were discussed with the admitting physician, patient and/or family and they are comfortable with the plan.     Elnora Morrison, MD 12/21/14 [phone]

## 2014-12-21 NOTE — Anesthesia Preprocedure Evaluation (Signed)
Anesthesia Evaluation  Patient identified by MRN, date of birth, ID band Patient awake    Reviewed: Allergy & Precautions, NPO status , Patient's Chart, lab work & pertinent test results  Airway Mallampati: I       Dental   Pulmonary former smoker,    Pulmonary exam normal       Cardiovascular hypertension, + CAD, + Past MI, + Cardiac Stents, + CABG and + Peripheral Vascular Disease Normal cardiovascular exam    Neuro/Psych    GI/Hepatic   Endo/Other    Renal/GU Renal InsufficiencyRenal disease     Musculoskeletal   Abdominal   Peds  Hematology   Anesthesia Other Findings   Reproductive/Obstetrics                             Anesthesia Physical Anesthesia Plan  ASA: III  Anesthesia Plan: General   Post-op Pain Management:    Induction: Intravenous  Airway Management Planned: Oral ETT  Additional Equipment:   Intra-op Plan:   Post-operative Plan: Extubation in OR  Informed Consent: I have reviewed the patients History and Physical, chart, labs and discussed the procedure including the risks, benefits and alternatives for the proposed anesthesia with the patient or authorized representative who has indicated his/her understanding and acceptance.     Plan Discussed with: CRNA, Anesthesiologist and Surgeon  Anesthesia Plan Comments:         Anesthesia Quick Evaluation

## 2014-12-21 NOTE — Brief Op Note (Signed)
12/21/2014  7:08 PM  PATIENT:  Jean Rosenthal  81 y.o. male  PRE-OPERATIVE DIAGNOSIS:  urethral sticture  POST-OPERATIVE DIAGNOSIS:  urethral sticture  PROCEDURE:  Procedure(s): CYSTOSCOPY WITH RETROGRADE AND URETHRAL DILATATION  (N/A)  SURGEON:  Surgeon(s) and Role:    * Alexis Frock, MD - Primary  PHYSICIAN ASSISTANT:   ASSISTANTS: none   ANESTHESIA:   general  EBL:     BLOOD ADMINISTERED:none  DRAINS: 63F 3 way foley to NS irrigation   LOCAL MEDICATIONS USED:  NONE  SPECIMEN:  No Specimen  DISPOSITION OF SPECIMEN:  N/A  COUNTS:  YES  TOURNIQUET:  * No tourniquets in log *  DICTATION: .Other Dictation: Dictation Number [id number]  PLAN OF CARE: Admit for overnight observation  PATIENT DISPOSITION:  PACU - hemodynamically stable.   Delay start of Pharmacological VTE agent (>24hrs) due to surgical blood loss or risk of bleeding: no

## 2014-12-21 NOTE — Anesthesia Procedure Notes (Signed)
Procedure Name: Intubation Date/Time: 12/21/2014 6:40 PM Performed by: Dione Booze Pre-anesthesia Checklist: Patient identified, Emergency Drugs available, Suction available and Patient being monitored Patient Re-evaluated:Patient Re-evaluated prior to inductionOxygen Delivery Method: Circle system utilized Preoxygenation: Pre-oxygenation with 100% oxygen Intubation Type: IV induction Laryngoscope Size: Mac and 4 Grade View: Grade I Tube type: Oral Tube size: 7.5 mm Number of attempts: 1 Airway Equipment and Method: Stylet Placement Confirmation: ETT inserted through vocal cords under direct vision and positive ETCO2 Secured at: 21 cm Tube secured with: Tape Dental Injury: Teeth and Oropharynx as per pre-operative assessment

## 2014-12-22 ENCOUNTER — Encounter (HOSPITAL_COMMUNITY): Payer: Self-pay | Admitting: Urology

## 2014-12-22 MED ORDER — CIPROFLOXACIN HCL 500 MG PO TABS
500.0000 mg | ORAL_TABLET | Freq: Every day | ORAL | Status: DC
  Administered 2014-12-23 – 2014-12-24 (×2): 500 mg via ORAL
  Filled 2014-12-22 (×2): qty 1

## 2014-12-22 NOTE — Progress Notes (Signed)
Pharmacy IV to PO conversion  This patient is receiving ciprofloxacin by the intravenous route. Based on criteria approved by the Pharmacy and Therapeutics Committee, and the Infectious Disease Division, the antibiotic(s) is/are being converted to equivalent oral dose form(s). These criteria include:   Patient being treated for a respiratory tract infection, urinary tract infection (colonization), cellulitis, or Clostridium Difficile Associated Diarrhea  The patient is not neutropenic and does not exhibit a GI malabsorption state  The patient is eating (either orally or per tube) and/or has been taking other orally administered medications for at least 24 hours.  The patient is improving clinically (physician assessment and a 24-hour Tmax of <=100.5 F)  If you have any questions about this conversion, please contact the Pharmacy Department (ext [phone]).  Thank you.  Reuel Boom, PharmD Pager: [phone] 12/22/2014, 1:20 PM

## 2014-12-22 NOTE — Progress Notes (Signed)
1 Day Post-Op Subjective: Patient reports that he is feeling well.  No pain.  Objective: Vital signs in last 24 hours: Temp:  [97.7 F (36.5 C)-98.9 F (37.2 C)] 97.9 F (36.6 C) (06/01 0617) Pulse Rate:  [36-78] 58 (06/01 0617) Resp:  [13-20] 16 (06/01 0617) BP: (123-156)/(49-81) 151/81 mmHg (06/01 0617) SpO2:  [97 %-100 %] 99 % (06/01 0617) Weight:  [75.4 kg (166 lb 3.6 oz)] 75.4 kg (166 lb 3.6 oz) (05/31 0921)  Intake/Output from previous day: 05/31 0701 - 06/01 0700 In: 9072.5 [I.V.:1472.5; IV Piggyback:350] Out: 7858 [Urine:8150] Intake/Output this shift:    Physical Exam:  Constitutional: Vital signs reviewed. WD WN in NAD   Eyes: PERRL, No scleral icterus.   Pulmonary/Chest: Normal effort   Urine with irrigant is slightly pink.  No clots.  Lab Results:  Recent Labs  12/19/14 1929 12/20/14 1611 12/21/14 0740  HGB 12.2* 12.9* 12.6*  HCT 36.0* 38.0* 36.8*   BMET  Recent Labs  12/20/14 1611 12/21/14 0740  NA 144 138  K 4.3 3.9  CL 110 111  CO2  --  20*  GLUCOSE 195* 183*  BUN 31* 37*  CREATININE 1.30* 1.85*  CALCIUM  --  9.0   No results for input(s): LABPT, INR in the last 72 hours. No results for input(s): LABURIN in the last 72 hours. Results for orders placed or performed during the hospital encounter of 12/21/14  Surgical pcr screen     Status: None   Collection Time: 12/21/14  9:43 AM  Result Value Ref Range Status   MRSA, PCR NEGATIVE NEGATIVE Final   Staphylococcus aureus NEGATIVE NEGATIVE Final    Comment:        The Xpert SA Assay (FDA approved for NASAL specimens in patients over 75 years of age), is one component of a comprehensive surveillance program.  Test performance has been validated by [facility] for patients greater than or equal to 46 year old. It is not intended to diagnose infection nor to guide or monitor treatment.     Studies/Results: Ct Abdomen Pelvis Wo Contrast  12/21/2014   CLINICAL DATA:  Colonic  diverticulosis.  Stable chronic dissection within the aorta.  EXAM: CT ABDOMEN AND PELVIS WITHOUT CONTRAST  TECHNIQUE: Multidetector CT imaging of the abdomen and pelvis was performed following the standard protocol without IV contrast.  COMPARISON:  04/09/2008  FINDINGS: Lower chest: Prior CABG. Heart is normal size. Lung bases are clear. No effusions.  Hepatobiliary: No biliary ductal dilatation or focal hepatic lesion. Gallbladder grossly unremarkable.  Pancreas: No focal abnormality or ductal dilatation.  Spleen: No focal abnormality.  Normal size.  Adrenals/Urinary Tract: Adrenal glands are unremarkable. 7 mm nonobstructing stone in the lower pole of the right kidney. There are multiple bilateral renal low-density lesions which are difficult to characterize on this unenhanced study. There is a low-density lesion in the lower pole of the left kidney measuring 4.8 cm in diameter which appears to have some internal septations and wall irregularity. Cannot exclude cystic renal neoplasm. This should be further evaluated with MRI or contrast-enhanced CT. No hydronephrosis. No ureteral stones. Urinary bladder is decompressed with Foley catheter in place. Radiation seeds in the region of the prostate.  Stomach/Bowel: Diffuse sigmoid diverticulosis. Scattered diverticula elsewhere throughout the colon. No active diverticulitis. Stomach and small bowel are decompressed.  Vascular/Lymphatic: Aorta and iliac vessels are heavily calcified, non aneurysmal. Chronic calcified focal dissection in the infrarenal aorta, stable since 2009.  Reproductive: Radiation seeds in the  region the prostate.  Other: No free fluid or free air.  Musculoskeletal: No focal bone lesion or acute bony abnormality.  IMPRESSION: Right lower pole nephrolithiasis. No ureteral stones or hydronephrosis.  Bilateral low-density lesions within the kidneys which cannot be fully characterized without IV contrast. Somewhat suspicious appearing low-density  lesion in the lower pole of the left kidney measuring 4.8 cm with probable internal septations and mural irregularity. This could be further evaluated with contrast-enhanced CT or MRI (preferred).   Electronically Signed   By: Rolm Baptise M.D.   On: 12/21/2014 11:57    Assessment/Plan:   Postoperative day #1.  Cystoscopy, balloon dilatation of bladder neck contracture, retrograde pyelograms.  He is doing well.  He still has some blood in his urine, but this is improving.  I will have the patient's CBI taper down.if this looks okay In the morning, his catheter will be discontinued for voiding trial   LOS: 1 day   Franchot Gallo M 12/22/2014, 9:07 AM

## 2014-12-23 MED ORDER — BETHANECHOL CHLORIDE 25 MG PO TABS
25.0000 mg | ORAL_TABLET | Freq: Three times a day (TID) | ORAL | Status: DC
  Administered 2014-12-23 – 2014-12-24 (×3): 25 mg via ORAL
  Filled 2014-12-23 (×4): qty 1

## 2014-12-23 NOTE — Progress Notes (Signed)
Per MD order attempted to instill 200cc of NS into bladder, pt could only tolerate 90cc, grimacing with pain. Foley was DC at 0740, around 1000 pt urinated 20cc, bladder scan post void range between 160-180. MD notified at 1020. Will continue to monitor.  Rosine Beat, RN

## 2014-12-23 NOTE — Op Note (Signed)
NAME:  STEEL, KERNEY NO.:  [id number]  MEDICAL RECORD NO.:  [id number]  LOCATION:                                 FACILITY:  PHYSICIAN:  Alexis Frock, MD     DATE OF BIRTH:  1921/11/26  DATE OF PROCEDURE:  12/21/2014                              OPERATIVE REPORT  DIAGNOSES:  Recurrent hematuria with urinary retention, radiation cystitis, prostate cancer, urethral stricture.  PROCEDURES: 1. Cystoscopy with clot evacuation. 2. Bilateral retrograde pyelogram interpretation.  SURGEON:  Alexis Frock, MD.  ESTIMATED BLOOD LOSS: 1. A 50 mL new blood. 2. A 100 mL of formed clot, old blood.  FINDINGS: 1. Unremarkable bilateral pyelograms, no hydronephrosis or filling     defects. 2. Diffusely erythematous and oozy bladder consistent with radiation     cystitis changes.  No focal masses or focal bleeding noted. 3. Somewhat tortuous urethra but no high-grade focal strictures.  This     easily accommodated a 23-French cystoscope and a 22-French final     catheter. 4. Catheter #1, 22-French 3 way Foley catheter with normal saline     irrigation, efflux light pink, 15 mL sterile water in the balloon.  INDICATION:  Mr. Melander is a pleasant 81 year old gentleman with history of multiple medical comorbidities, including ischemic cardiac disease status post CABG and was recently stenting within the past year.  He also has history of the prostate cancer status post radiation therapy years ago.  He has had a protracted course with periodic hematuria requiring catheterization, most recently for last several months, hematuria and urinary retention as well as symptomatic stricture.  He was tentatively planned to undergo operative intervention next month; however, he re-presented to the ER again this morning, and this is his 3rd ER visit in several days, and it was then felt that more urgent endoscopic evaluation with cysto clot evacuation, retrograde to complete his  hematuria workup, possible urethral dilation was warranted. Informed consent was obtained and placed in medical record.  PROCEDURE IN DETAIL:  The patient being Pepe Mineau verified.  Procedure being cysto-retrograde, cystoscopy, and catheter placement was confirmed.  Procedure was carried out.  A time-out was performed. Intravenous antibiotics were administered.  General anesthesia was induced.  The patient was placed into a low lithotomy position.  Sterile field was created by prepping and draping the patient's penis, perineum, proximal thighs using iodine x3.  Next, cystourethroscopy was performed using a 22-French rigid cystoscope with 30-degree offset lens. Inspection of anterior urethra was unremarkable.  Inspection of posterior urethra revealed some mild tortuosity of his membranous and prostatic urethra without high-grade focal strictures.  Inspection of the bladder revealed diffuse oozy erythema in all quadrants.  No focal masses noted.  There was significant trabeculation.  A clot evacuation was performed with a To[place]y syringe.  Approximately 100 mL of old clot was removed.  Ureteral orifices were in the normal anatomic position. Left ureteral orifice was cannulated with a 6-French end-hole catheter and left retrograde pyelogram was obtained.  Left retrograde pyelogram demonstrated single left ureter, single system left kidney; no filling defects or narrowing noted.  Similarly, a right retrograde pyelogram was obtained.  Right  retrograde pyelogram demonstrated a single right ureter with single system right kidney; no filling defects or narrowing noted.  As it appeared that the patient's hematuria was most likely from radiation cystitis changes and he did not require more formal dilation, the cystoscope was exchanged for a new 22-French 3 way Foley catheter over a wire into lower urinary bladder.  A 15 mL sterile water was placed in the balloon and connected to normal saline  irrigation, efflux light pink; and the procedure was terminated.  The patient tolerated the procedure well.  There were no immediate periprocedural complications.  The patient was taken to the postanesthesia care unit in stable condition with plan for admission and continued IV antibiotics and bladder irrigation.          ______________________________ Alexis Frock, MD     TM/MEDQ  D:  12/21/2014  T:  12/22/2014  Job:  [id number]

## 2014-12-23 NOTE — Discharge Instructions (Signed)

## 2014-12-23 NOTE — Progress Notes (Signed)
Patient c/o some lower abdominal discomfort.  Attempted to have patient void but unsuccessful. Bladder scan ranged 250-280cc. Patient no longer complaining of pain. Will attempt to have the patient void/bladder scan again in an hour.

## 2014-12-23 NOTE — Progress Notes (Signed)
Pt has not voided any more since earlier, bladder scan showed 179cc. Will continue to monitor.  Rosine Beat, RN

## 2014-12-23 NOTE — Progress Notes (Signed)
Post MD visit, pt was able to void some but unable to measure d/t incontinent episode, bladder scan 434cc. Per MD RN to I&O cath if bladder scan >300 or discomfort present. Attempted I&O cath X2 with no success. MD was paged, and new orders received for coude I&O cath. Coude inserted with minimal difficulty & 325cc of urine returned. Pt out of bed and in chair currently. Will ambulate in hall prior to shift change. Will continue to monitor.  Rosine Beat, RN

## 2014-12-23 NOTE — Progress Notes (Signed)
2 Days Post-Op Subjective: Patient reports no pain. Just woke up  Objective: Vital signs in last 24 hours: Temp:  [98.2 F (36.8 C)-98.4 F (36.9 C)] 98.4 F (36.9 C) (06/02 0631) Pulse Rate:  [33-64] 33 (06/02 0631) Resp:  [18] 18 (06/02 0631) BP: (103-117)/(41-51) 117/51 mmHg (06/02 0631) SpO2:  [97 %-99 %] 98 % (06/02 0631)  Intake/Output from previous day: 06/01 0701 - 06/02 0700 In: 2294.2 [P.O.:1320; I.V.:574.2; IV Piggyback:100] Out: 1450 [Urine:1450] Intake/Output this shift: Total I/O In: -  Out: 400 [Urine:400]  Physical Exam:  Constitutional: Vital signs reviewed. WD WN in NAD   Eyes: PERRL, No scleral icterus.   Pulmonary/Chest: Normal effort   Urine clear pink Lab Results:  Recent Labs  12/20/14 1611 12/21/14 0740  HGB 12.9* 12.6*  HCT 38.0* 36.8*   BMET  Recent Labs  12/20/14 1611 12/21/14 0740  NA 144 138  K 4.3 3.9  CL 110 111  CO2  --  20*  GLUCOSE 195* 183*  BUN 31* 37*  CREATININE 1.30* 1.85*  CALCIUM  --  9.0   No results for input(s): LABPT, INR in the last 72 hours. No results for input(s): LABURIN in the last 72 hours. Results for orders placed or performed during the hospital encounter of 12/21/14  Surgical pcr screen     Status: None   Collection Time: 12/21/14  9:43 AM  Result Value Ref Range Status   MRSA, PCR NEGATIVE NEGATIVE Final   Staphylococcus aureus NEGATIVE NEGATIVE Final    Comment:        The Xpert SA Assay (FDA approved for NASAL specimens in patients over 34 years of age), is one component of a comprehensive surveillance program.  Test performance has been validated by [facility] for patients greater than or equal to 10 year old. It is not intended to diagnose infection nor to guide or monitor treatment.     Studies/Results: Ct Abdomen Pelvis Wo Contrast  12/21/2014   CLINICAL DATA:  Colonic diverticulosis.  Stable chronic dissection within the aorta.  EXAM: CT ABDOMEN AND PELVIS WITHOUT CONTRAST   TECHNIQUE: Multidetector CT imaging of the abdomen and pelvis was performed following the standard protocol without IV contrast.  COMPARISON:  04/09/2008  FINDINGS: Lower chest: Prior CABG. Heart is normal size. Lung bases are clear. No effusions.  Hepatobiliary: No biliary ductal dilatation or focal hepatic lesion. Gallbladder grossly unremarkable.  Pancreas: No focal abnormality or ductal dilatation.  Spleen: No focal abnormality.  Normal size.  Adrenals/Urinary Tract: Adrenal glands are unremarkable. 7 mm nonobstructing stone in the lower pole of the right kidney. There are multiple bilateral renal low-density lesions which are difficult to characterize on this unenhanced study. There is a low-density lesion in the lower pole of the left kidney measuring 4.8 cm in diameter which appears to have some internal septations and wall irregularity. Cannot exclude cystic renal neoplasm. This should be further evaluated with MRI or contrast-enhanced CT. No hydronephrosis. No ureteral stones. Urinary bladder is decompressed with Foley catheter in place. Radiation seeds in the region of the prostate.  Stomach/Bowel: Diffuse sigmoid diverticulosis. Scattered diverticula elsewhere throughout the colon. No active diverticulitis. Stomach and small bowel are decompressed.  Vascular/Lymphatic: Aorta and iliac vessels are heavily calcified, non aneurysmal. Chronic calcified focal dissection in the infrarenal aorta, stable since 2009.  Reproductive: Radiation seeds in the region the prostate.  Other: No free fluid or free air.  Musculoskeletal: No focal bone lesion or acute bony abnormality.  IMPRESSION:  Right lower pole nephrolithiasis. No ureteral stones or hydronephrosis.  Bilateral low-density lesions within the kidneys which cannot be fully characterized without IV contrast. Somewhat suspicious appearing low-density lesion in the lower pole of the left kidney measuring 4.8 cm with probable internal septations and mural  irregularity. This could be further evaluated with contrast-enhanced CT or MRI (preferred).   Electronically Signed   By: Rolm Baptise M.D.   On: 12/21/2014 11:57    Assessment/Plan:   Doing well after cysto/balloon dilation  Will give voiding trial and possibnly d/c   LOS: 2 days   Franchot Gallo M 12/23/2014, 6:58 AM

## 2014-12-23 NOTE — Progress Notes (Signed)
CSW received consult for discharge planning. CSW spoke with patient's daughter, Janann Colonel in hallway re: discharge planning. Daughter informed CSW that she is in town from [place] until next Thursday, but patient will return home with caregivers at discharge. Daughter informed CSW that he had received services through [facility] for [place] in the past and would like for services to be setup for him again at discharge. Daughter informed CSW also that she has been looking into an ALF in [place] ([facility]) for patient to eventually move in, to be closer to family.   CSW made RNCM, Juliann Pulse aware re: home care services. Daughter is anticipating patient be discharged home tomorrow & states that she can provide transportation.    No further CSW needs identified - CSW signing off.   [name], [facility] Clinical Social Worker cell #: [phone]

## 2014-12-23 NOTE — Progress Notes (Signed)
Pt still has not been able to urinate. Bladder scan 324cc. Pt not complaining of pain at this time.  Rosine Beat, RN

## 2014-12-24 LAB — URINE CULTURE: Colony Count: >=100000

## 2014-12-24 MED ORDER — LIDOCAINE HCL 2 % EX GEL
1.0000 "application " | Freq: Once | CUTANEOUS | Status: AC
  Administered 2014-12-24: 1 via URETHRAL
  Filled 2014-12-24: qty 5

## 2014-12-24 NOTE — Evaluation (Addendum)
Physical Therapy Evaluation-1x Patient Details Name: Henry Curtis MRN: [id number] DOB: 03-Jan-1922 Today's Date: 12/24/2014   History of Present Illness  81 yo male s/p cystoscopy, urethral dilatation 5/31. Hx of prostate cancer, HTn, MI, CABG.  Pt lives alone with caregivers during day but not at night.   Clinical Impression  On eval, pt was Min guard assist for mobility-able to ambulate ~200 feet with straight cane. Pt tolerated activity well. Slightly unsteady at times but no LOB. Daughter states she will be able to stay with pt until Thursday. Pt has caregivers during the day but not at night. Daughter with questions about ALF- I contacted SW to see if they could provide some info and answer some questions prior to d/c today. Daughter is hopeful pt will improve with HHPT.     Follow Up Recommendations Home health PT;Supervision/Assistance - 24 hour initially. Daughter considering ALF placement at some point    Equipment Recommendations  None recommended by PT    Recommendations for Other Services       Precautions / Restrictions Precautions Precautions: Fall Restrictions Weight Bearing Restrictions: No      Mobility  Bed Mobility               General bed mobility comments: pt oob standing with nursing.   Transfers Overall transfer level: Needs assistance   Transfers: Sit to/from Stand Sit to Stand: Supervision         General transfer comment: supervision to sit on bed after walking  Ambulation/Gait Ambulation/Gait assistance: Min guard Ambulation Distance (Feet): 200 Feet Assistive device: Straight cane Gait Pattern/deviations: Decreased stride length;Step-through pattern;Trunk flexed     General Gait Details: close guard for safety. intermittent unsteadiness but no LOB  Stairs            Wheelchair Mobility    Modified Rankin (Stroke Patients Only)       Balance Overall balance assessment: Needs assistance         Standing balance  support: During functional activity;Single extremity supported Standing balance-Leahy Scale: Fair                               Pertinent Vitals/Pain Pain Assessment: No/denies pain    Home Living Family/patient expects to be discharged to:: Private residence Living Arrangements: Alone Available Help at Discharge: Family;Personal care attendant (8 hours during the day)   Home Access: Stairs to enter Entrance Stairs-Rails: Right;Left Entrance Stairs-Number of Steps: 4 Home Layout: Able to live on main level with bedroom/bathroom;Two level Home Equipment: Cane - single point;Walker - 2 wheels;Bedside commode;Shower seat      Prior Function Level of Independence: Independent with assistive device(s)         Comments: using cane primarily. daughter will be with pt until thursday-no night time help arranged as of yet.      Hand Dominance        Extremity/Trunk Assessment   Upper Extremity Assessment: Generalized weakness           Lower Extremity Assessment: Generalized weakness      Cervical / Trunk Assessment: Kyphotic  Communication   Communication: No difficulties  Cognition Arousal/Alertness: Awake/alert Behavior During Therapy: WFL for tasks assessed/performed Overall Cognitive Status: Within Functional Limits for tasks assessed                      General Comments      Exercises  Assessment/Plan    PT Assessment All further PT needs can be met in the next venue of care (HHPT)  PT Diagnosis Difficulty walking;Generalized weakness   PT Problem List Decreased strength;Decreased balance;Decreased activity tolerance;Decreased mobility  PT Treatment Interventions     PT Goals (Current goals can be found in the Care Plan section) Acute Rehab PT Goals Patient Stated Goal: home PT Goal Formulation: All assessment and education complete, DC therapy    Frequency     Barriers to discharge Decreased caregiver support (at  night-daughter only here until Thursday)      Co-evaluation               End of Session Equipment Utilized During Treatment: Gait belt Activity Tolerance: Patient tolerated treatment well Patient left: with call bell/phone within reach;with family/visitor present           Time: 1130-1150 PT Time Calculation (min) (ACUTE ONLY): 20 min   Charges:   PT Evaluation $Initial PT Evaluation Tier I: 1 Procedure     PT G Codes:        Weston Anna, MPT Pager: [phone]

## 2014-12-24 NOTE — Discharge Summary (Signed)
Patient ID: Henry Curtis MRN: [id number] DOB/AGE: 1921/09/10 81 y.o.  Admit date: 12/21/2014 Discharge date: 12/24/2014  Primary Care Physician:  Tommy Medal, MD  Discharge Diagnoses:   Present on Admission:  . Hematuria  Consults:  None     Discharge Medications:   Medication List    STOP taking these medications        tamsulosin 0.4 MG Caps capsule  Commonly known as:  FLOMAX      TAKE these medications        aspirin 325 MG EC tablet  Take 1 tablet (325 mg total) by mouth daily.     atorvastatin 80 MG tablet  Commonly known as:  LIPITOR  Take 1 tablet (80 mg total) by mouth daily at 6 PM.     clopidogrel 75 MG tablet  Commonly known as:  PLAVIX  Take 1 tablet (75 mg total) by mouth daily with breakfast.     CULTURELLE DIGESTIVE HEALTH PO  Take 1 tablet by mouth daily.     feeding supplement (ENSURE COMPLETE) Liqd  Take 237 mLs by mouth daily at 3 pm.     losartan 100 MG tablet  Commonly known as:  COZAAR  Take 100 mg by mouth daily.     metoprolol tartrate 12.5 mg Tabs tablet  Commonly known as:  LOPRESSOR  Take 0.5 tablets (12.5 mg total) by mouth 2 (two) times daily.     nitroGLYCERIN 0.4 MG SL tablet  Commonly known as:  NITROSTAT  Place 1 tablet (0.4 mg total) under the tongue every 5 (five) minutes as needed for chest pain.     saccharomyces boulardii 250 MG capsule  Commonly known as:  FLORASTOR  Take 1 capsule (250 mg total) by mouth 2 (two) times daily.         Significant Diagnostic Studies:  Ct Abdomen Pelvis Wo Contrast  12/21/2014   CLINICAL DATA:  Colonic diverticulosis.  Stable chronic dissection within the aorta.  EXAM: CT ABDOMEN AND PELVIS WITHOUT CONTRAST  TECHNIQUE: Multidetector CT imaging of the abdomen and pelvis was performed following the standard protocol without IV contrast.  COMPARISON:  04/09/2008  FINDINGS: Lower chest: Prior CABG. Heart is normal size. Lung bases are clear. No effusions.  Hepatobiliary: No biliary  ductal dilatation or focal hepatic lesion. Gallbladder grossly unremarkable.  Pancreas: No focal abnormality or ductal dilatation.  Spleen: No focal abnormality.  Normal size.  Adrenals/Urinary Tract: Adrenal glands are unremarkable. 7 mm nonobstructing stone in the lower pole of the right kidney. There are multiple bilateral renal low-density lesions which are difficult to characterize on this unenhanced study. There is a low-density lesion in the lower pole of the left kidney measuring 4.8 cm in diameter which appears to have some internal septations and wall irregularity. Cannot exclude cystic renal neoplasm. This should be further evaluated with MRI or contrast-enhanced CT. No hydronephrosis. No ureteral stones. Urinary bladder is decompressed with Foley catheter in place. Radiation seeds in the region of the prostate.  Stomach/Bowel: Diffuse sigmoid diverticulosis. Scattered diverticula elsewhere throughout the colon. No active diverticulitis. Stomach and small bowel are decompressed.  Vascular/Lymphatic: Aorta and iliac vessels are heavily calcified, non aneurysmal. Chronic calcified focal dissection in the infrarenal aorta, stable since 2009.  Reproductive: Radiation seeds in the region the prostate.  Other: No free fluid or free air.  Musculoskeletal: No focal bone lesion or acute bony abnormality.  IMPRESSION: Right lower pole nephrolithiasis. No ureteral stones or hydronephrosis.  Bilateral low-density lesions within the  kidneys which cannot be fully characterized without IV contrast. Somewhat suspicious appearing low-density lesion in the lower pole of the left kidney measuring 4.8 cm with probable internal septations and mural irregularity. This could be further evaluated with contrast-enhanced CT or MRI (preferred).   Electronically Signed   By: Rolm Baptise M.D.   On: 12/21/2014 11:57    Brief H and P: For complete details please refer to admission H and P, but in brief the patient was admitted for  management of a bladder neck contracture as well as gross hematuria.  Hospital Course: the patient was admitted for management of gross hematuria, retention and a bladder neck contracture.  He underwent cystoscopy and TUR bladder neck contracture by Dr. Tresa Moore.  Despite having a patentbladder neck, he was still unable to void.  A coud catheter was reintroduced after failed voiding trial.  Active Problems:   Hematuria   Pressure ulcer   Day of Discharge BP 151/74 mmHg  Pulse 59  Temp(Src) 98.5 F (36.9 C) (Oral)  Resp 18  Ht 5\' 8"  (1.727 m)  Wt 75.4 kg (166 lb 3.6 oz)  BMI 25.28 kg/m2  SpO2 98%  No results found for this or any previous visit (from the past 24 hour(s)).  Physical Exam: General: Alert and awake oriented x3 not in any acute distress. HEENT: anicteric sclera, pupils reactive to light and accommodation CVS: S1-S2 clear no murmur rubs or gallops Chest: clear to auscultation bilaterally, no wheezing rales or rhonchi Abdomen: soft nontender, nondistended, normal bowel sounds, no organomegaly Extremities: no cyanosis, clubbing or edema noted bilaterally Neuro: Cranial nerves II-XII intact, no focal neurological deficits  Disposition:  home  Diet:  No restrictions  Activity:  Gradually increase   Disposition and Follow-up:     Discharge Instructions    Discharge patient    Complete by:  As directed             We will call for follow-up  TESTS THAT NEED FOLLOW-UP  none  DISCHARGE FOLLOW-UP Follow-up Information    Follow up with Jorja Loa, MD.   Specialty:  Urology   Why:  we'll call you   Contact information:   [place] [id number] [phone]       Time spent on Discharge:  20 minutes  Signed: Jorja Loa 12/24/2014, 8:18 AM

## 2014-12-24 NOTE — Care Management Note (Signed)
Case Management Note  Patient Details  Name: LESEAN WOOLVERTON MRN: [id number] Date of Birth: 08/11/1921  Subjective/Objective:  81 y/o m admitted w/hematuria.                  Action/Plan:d/c home no needs or orders.   Expected Discharge Date:                  Expected Discharge Plan:  Home/Self Care  In-House Referral:     Discharge planning Services  CM Consult  Post Acute Care Choice:    Choice offered to:     DME Arranged:    DME Agency:     HH Arranged:    [place] Agency:     Status of Service:  Completed, signed off  Medicare Important Message Given:    Date Medicare IM Given:    Medicare IM give by:    Date Additional Medicare IM Given:    Additional Medicare Important Message give by:     If discussed at [place] of Stay Meetings, dates discussed:    Additional Comments:  Dessa Phi, RN 12/24/2014, 8:54 AM

## 2014-12-24 NOTE — Progress Notes (Signed)
Pt still not able to void. Bladder scanned pt, there was 444ml in the bladder. I&O cath pt with 84F coude, took out 551ml of urine. bedpad under pt was wet with urine. Pt stated that it "dribbles sometimes." Put a condom cath on pt to catch any urine. Will continue to monitor pt.

## 2014-12-24 NOTE — Care Management Note (Signed)
Case Management Note  Patient Details  Name: Henry Curtis MRN: [id number] Date of Birth: 05/20/1922  Subjective/Objective:                    Action/Plan:d/c home w/HHPT   Expected Discharge Date:                  Expected Discharge Plan:  [place]  In-House Referral:     Discharge planning Services  CM Consult  Post Acute Care Choice:    Choice offered to:  Patient  DME Arranged:    DME Agency:     HH Arranged:  PT [place]:  [facility] (Tim rep aware of d/c & HHPT order.)  Status of Service:  Completed, signed off  Medicare Important Message Given:  Yes Date Medicare IM Given:  12/24/14 Medicare IM give by:  Dessa Phi Date Additional Medicare IM Given:    Additional Medicare Important Message give by:     If discussed at [place] of Stay Meetings, dates discussed:    Additional Comments:  Dessa Phi, RN 12/24/2014, 12:00 PM

## 2014-12-25 ENCOUNTER — Encounter (HOSPITAL_COMMUNITY): Payer: Self-pay | Admitting: *Deleted

## 2014-12-25 ENCOUNTER — Emergency Department (HOSPITAL_COMMUNITY)
Admission: EM | Admit: 2014-12-25 | Discharge: 2014-12-25 | Disposition: A | Discharge status: Home / Self Care | Payer: Medicare Other | Attending: Emergency Medicine | Admitting: Emergency Medicine

## 2014-12-25 DIAGNOSIS — I252 Old myocardial infarction: Secondary | ICD-10-CM | POA: Diagnosis not present

## 2014-12-25 DIAGNOSIS — I2510 Atherosclerotic heart disease of native coronary artery without angina pectoris: Secondary | ICD-10-CM | POA: Insufficient documentation

## 2014-12-25 DIAGNOSIS — T83098A Other mechanical complication of other indwelling urethral catheter, initial encounter: Secondary | ICD-10-CM | POA: Insufficient documentation

## 2014-12-25 DIAGNOSIS — T83021A Displacement of indwelling urethral catheter, initial encounter: Secondary | ICD-10-CM

## 2014-12-25 DIAGNOSIS — Z79899 Other long term (current) drug therapy: Secondary | ICD-10-CM | POA: Diagnosis not present

## 2014-12-25 DIAGNOSIS — Z8546 Personal history of malignant neoplasm of prostate: Secondary | ICD-10-CM | POA: Insufficient documentation

## 2014-12-25 DIAGNOSIS — Z7902 Long term (current) use of antithrombotics/antiplatelets: Secondary | ICD-10-CM | POA: Diagnosis not present

## 2014-12-25 DIAGNOSIS — Z87891 Personal history of nicotine dependence: Secondary | ICD-10-CM | POA: Diagnosis not present

## 2014-12-25 DIAGNOSIS — Z951 Presence of aortocoronary bypass graft: Secondary | ICD-10-CM | POA: Diagnosis not present

## 2014-12-25 DIAGNOSIS — Z7982 Long term (current) use of aspirin: Secondary | ICD-10-CM | POA: Insufficient documentation

## 2014-12-25 DIAGNOSIS — Y846 Urinary catheterization as the cause of abnormal reaction of the patient, or of later complication, without mention of misadventure at the time of the procedure: Secondary | ICD-10-CM | POA: Insufficient documentation

## 2014-12-25 DIAGNOSIS — I10 Essential (primary) hypertension: Secondary | ICD-10-CM | POA: Diagnosis not present

## 2014-12-25 LAB — URINE MICROSCOPIC-ADD ON

## 2014-12-25 LAB — URINALYSIS, ROUTINE W REFLEX MICROSCOPIC
Bilirubin Urine: NEGATIVE
Glucose, UA: NEGATIVE mg/dL
Ketones, ur: NEGATIVE mg/dL
Nitrite: NEGATIVE
PROTEIN: NEGATIVE mg/dL
Specific Gravity, Urine: 1.014 (ref 1.005–1.030)
Urobilinogen, UA: 1 mg/dL (ref 0.0–1.0)
pH: 6 (ref 5.0–8.0)

## 2014-12-25 NOTE — ED Notes (Signed)
Bed: WA11 Expected date:  Expected time:  Means of arrival:  Comments: Hall B 

## 2014-12-25 NOTE — Discharge Instructions (Signed)
Foley Catheter Care °A Foley catheter is a soft, flexible tube that is placed into the bladder to drain urine. A Foley catheter may be inserted if: °· You leak urine or are not able to control when you urinate (urinary incontinence). °· You are not able to urinate when you need to (urinary retention). °· You had prostate surgery or surgery on the genitals. °· You have certain medical conditions, such as multiple sclerosis, dementia, or a spinal cord injury. °If you are going home with a Foley catheter in place, follow the instructions below. °TAKING CARE OF THE CATHETER °1. Wash your hands with soap and water. °2. Using mild soap and warm water on a clean washcloth: °· Clean the area on your body closest to the catheter insertion site using a circular motion, moving away from the catheter. Never wipe toward the catheter because this could sweep bacteria up into the urethra and cause infection. °· Remove all traces of soap. Pat the area dry with a clean towel. For males, reposition the foreskin. °3. Attach the catheter to your leg so there is no tension on the catheter. Use adhesive tape or a leg strap. If you are using adhesive tape, remove any sticky residue left behind by the previous tape you used. °4. Keep the drainage bag below the level of the bladder, but keep it off the floor. °5. Check throughout the day to be sure the catheter is working and urine is draining freely. Make sure the tubing does not become kinked. °6. Do not pull on the catheter or try to remove it. Pulling could damage internal tissues. °TAKING CARE OF THE DRAINAGE BAGS °You will be given two drainage bags to take home. One is a large overnight drainage bag, and the other is a smaller leg bag that fits underneath clothing. You may wear the overnight bag at any time, but you should never wear the smaller leg bag at night. Follow the instructions below for how to empty, change, and clean your drainage bags. °Emptying the Drainage Bag °You must  empty your drainage bag when it is  -½ full or at least 2-3 times a day. °1. Wash your hands with soap and water. °2. Keep the drainage bag below your hips, below the level of your bladder. This stops urine from going back into the tubing and into your bladder. °3. Hold the dirty bag over the toilet or a clean container. °4. Open the pour spout at the bottom of the bag and empty the urine into the toilet or container. Do not let the pour spout touch the toilet, container, or any other surface. Doing so can place bacteria on the bag, which can cause an infection. °5. Clean the pour spout with a gauze pad or cotton ball that has rubbing alcohol on it. °6. Close the pour spout. °7. Attach the bag to your leg with adhesive tape or a leg strap. °8. Wash your hands well. °Changing the Drainage Bag °Change your drainage bag once a month or sooner if it starts to smell bad or look dirty. Below are steps to follow when changing the drainage bag. °1. Wash your hands with soap and water. °2. Pinch off the rubber catheter so that urine does not spill out. °3. Disconnect the catheter tube from the drainage tube at the connection valve. Do not let the tubes touch any surface. °4. Clean the end of the catheter tube with an alcohol wipe. Use a different alcohol wipe to clean the   end of the drainage tube. °5. Connect the catheter tube to the drainage tube of the clean drainage bag. °6. Attach the new bag to the leg with adhesive tape or a leg strap. Avoid attaching the new bag too tightly. °7. Wash your hands well. °Cleaning the Drainage Bag °1. Wash your hands with soap and water. °2. Wash the bag in warm, soapy water. °3. Rinse the bag thoroughly with warm water. °4. Fill the bag with a solution of white vinegar and water (1 cup vinegar to 1 qt warm water [.2 L vinegar to 1 L warm water]). Close the bag and soak it for 30 minutes in the solution. °5. Rinse the bag with warm water. °6. Hang the bag to dry with the pour spout open  and hanging downward. °7. Store the clean bag (once it is dry) in a clean plastic bag. °8. Wash your hands well. °PREVENTING INFECTION °· Wash your hands before and after handling your catheter. °· Take showers daily and wash the area where the catheter enters your body. Do not take baths. Replace wet leg straps with dry ones, if this applies. °· Do not use powders, sprays, or lotions on the genital area. Only use creams, lotions, or ointments as directed by your caregiver. °· For females, wipe from front to back after each bowel movement. °· Drink enough fluids to keep your urine clear or pale yellow unless you have a fluid restriction. °· Do not let the drainage bag or tubing touch or lie on the floor. °· Wear cotton underwear to absorb moisture and to keep your skin drier. °SEEK MEDICAL CARE IF:  °· Your urine is cloudy or smells unusually bad. °· Your catheter becomes clogged. °· You are not draining urine into the bag or your bladder feels full. °· Your catheter starts to leak. °SEEK IMMEDIATE MEDICAL CARE IF:  °· You have pain, swelling, redness, or pus where the catheter enters the body. °· You have pain in the abdomen, legs, lower back, or bladder. °· You have a fever. °· You see blood fill the catheter, or your urine is pink or red. °· You have nausea, vomiting, or chills. °· Your catheter gets pulled out. °MAKE SURE YOU:  °· Understand these instructions. °· Will watch your condition. °· Will get help right away if you are not doing well or get worse. °Document Released: 07/09/2005 Document Revised: 11/23/2013 Document Reviewed: 06/30/2012 °Exit[employer]® Patient Information ©2015 [employer]. This information is not intended to replace advice given to you by your health care provider. Make sure you discuss any questions you have with your health care provider. ° °

## 2014-12-25 NOTE — ED Notes (Signed)
Pt's daughter sts it's time for pt to take his meds, which she brought in, dr Zenia Resides approved.

## 2014-12-25 NOTE — ED Notes (Signed)
Per EMS pt coming from home with c/o clogged urinary catheter, pt was d/c from hospital yesterday with foley, and today family reports pt hasn't had much output and is c/o groin pain.

## 2014-12-25 NOTE — ED Provider Notes (Signed)
CSN: [id number]     Arrival date & time 12/25/14  1037 History   First MD Initiated Contact with Patient 12/25/14 1057     Chief Complaint  Patient presents with  . foley cath problem     (Consider location/radiation/quality/duration/timing/severity/associated sxs/prior Treatment) HPI Comments: Patient here complaining of nondraining Foley catheter 12 hours. Notes suprapubic discomfort. No fever vomiting. No flank pain. Discharge from the hospital yesterday. Symptoms persistent and nothing makes them better. No treatment use prior to arrival  The history is provided by the patient.    Past Medical History  Diagnosis Date  . Prostate cancer   . Hypertension   . Myocardial infarction   . Coronary artery disease involving native coronary artery 12/1989    Unstable Angina  . S/P CABG x 3 12/27/1989    LIMA-D1-LAD, SVG-rPDA (PDA Endarterectomy & patch andioplasty)    Past Surgical History  Procedure Laterality Date  . Appendectomy    . Multiple tooth extractions Bilateral   . Coronary artery bypass graft    . Left heart catheterization with coronary/graft angiogram N/A 06/25/2014    Procedure: LEFT HEART CATHETERIZATION WITH Beatrix Fetters;  Surgeon: Leonie Man, MD;  Location: [facility] CATH LAB;  Service: Cardiovascular;  Laterality: N/A;  . Cystoscopy with urethral dilatation N/A 12/21/2014    Procedure: CYSTOSCOPY WITH RETROGRADE AND URETHRAL DILATATION ;  Surgeon: Alexis Frock, MD;  Location: [facility] ORS;  Service: Urology;  Laterality: N/A;   Family History  Problem Relation Age of Onset  . Diabetes Father   . Deep vein thrombosis Father    History  Substance Use Topics  . Smoking status: Former Research scientist (life sciences)  . Smokeless tobacco: Not on file  . Alcohol Use: No    Review of Systems  All other systems reviewed and are negative.     Allergies  Review of patient's allergies indicates no known allergies.  Home Medications   Prior to Admission medications   Medication  Sig Start Date End Date Taking? Authorizing Provider  aspirin EC 325 MG EC tablet Take 1 tablet (325 mg total) by mouth daily. 06/29/14  Yes [name], MD  atorvastatin (LIPITOR) 80 MG tablet Take 1 tablet (80 mg total) by mouth daily at 6 PM. Patient taking differently: Take 40 mg by mouth daily.  06/29/14  Yes [name], MD  clopidogrel (PLAVIX) 75 MG tablet Take 1 tablet (75 mg total) by mouth daily with breakfast. 06/29/14  Yes [name], MD  feeding supplement, ENSURE COMPLETE, (ENSURE COMPLETE) LIQD Take 237 mLs by mouth daily at 3 pm. 06/29/14  Yes [name], MD  Lactobacillus-Inulin (CULTURELLE DIGESTIVE HEALTH PO) Take 1 tablet by mouth daily.   Yes Historical Provider, MD  losartan (COZAAR) 100 MG tablet Take 100 mg by mouth daily.   Yes Historical Provider, MD  memantine (NAMENDA) 10 MG tablet Take 10 mg by mouth 2 (two) times daily.   Yes Historical Provider, MD  metoprolol tartrate (LOPRESSOR) 25 MG tablet Take 12.5 mg by mouth 2 (two) times daily.   Yes Historical Provider, MD  nitroGLYCERIN (NITROSTAT) 0.4 MG SL tablet Place 1 tablet (0.4 mg total) under the tongue every 5 (five) minutes as needed for chest pain. 07/27/14  Yes Lelon Perla, MD  metoprolol tartrate (LOPRESSOR) 12.5 mg TABS tablet Take 0.5 tablets (12.5 mg total) by mouth 2 (two) times daily. Patient not taking: Reported on 12/25/2014 06/29/14   [name], MD  saccharomyces boulardii (FLORASTOR) 250 MG capsule Take 1 capsule (  250 mg total) by mouth 2 (two) times daily. Patient not taking: Reported on 10/31/2014 06/29/14   [name], MD   BP 141/58 mmHg  Pulse 66  Temp(Src) 98.3 F (36.8 C) (Oral)  Resp 18  SpO2 98% Physical Exam  Constitutional: He is oriented to person, place, and time. He appears well-developed and well-nourished.  Non-toxic appearance. No distress.  HENT:  Head: Normocephalic and atraumatic.  Eyes: Conjunctivae, EOM and lids are normal. Pupils are equal, round,  and reactive to light.  Neck: Normal range of motion. Neck supple. No tracheal deviation present. No thyroid mass present.  Cardiovascular: Normal rate, regular rhythm and normal heart sounds.  Exam reveals no gallop.   No murmur heard. Pulmonary/Chest: Effort normal and breath sounds normal. No stridor. No respiratory distress. He has no decreased breath sounds. He has no wheezes. He has no rhonchi. He has no rales.  Abdominal: Soft. Normal appearance and bowel sounds are normal. He exhibits no distension. There is tenderness in the suprapubic area. There is no rebound and no CVA tenderness.  Musculoskeletal: Normal range of motion. He exhibits no edema or tenderness.  Neurological: He is alert and oriented to person, place, and time. He has normal strength. No cranial nerve deficit or sensory deficit. GCS eye subscore is 4. GCS verbal subscore is 5. GCS motor subscore is 6.  Skin: Skin is warm and dry. No abrasion and no rash noted.  Psychiatric: He has a normal mood and affect. His speech is normal and behavior is normal.  Nursing note and vitals reviewed.   ED Course  Procedures (including critical care time) Labs Review Labs Reviewed  URINALYSIS, ROUTINE W REFLEX MICROSCOPIC (NOT AT [facility])    Imaging Review No results found.   EKG Interpretation None      MDM   Final diagnoses:  None    Foley catheter placed by nursing with good return. Patient will follow-up with his Dr. as needed    Lacretia Leigh, MD 12/25/14 1233

## 2014-12-25 NOTE — ED Notes (Signed)
Coude catheter, size 18 inserted, return of 700 ml, clear, yellow urine, patient reports immediate pain relief

## 2015-01-03 ENCOUNTER — Ambulatory Visit (INDEPENDENT_AMBULATORY_CARE_PROVIDER_SITE_OTHER): Payer: Medicare Other | Admitting: Cardiology

## 2015-01-03 ENCOUNTER — Encounter: Payer: Self-pay | Admitting: Cardiology

## 2015-01-03 VITALS — BP 140/72 | HR 58 | Ht 69.0 in | Wt 174.3 lb

## 2015-01-03 DIAGNOSIS — N320 Bladder-neck obstruction: Secondary | ICD-10-CM

## 2015-01-03 DIAGNOSIS — Z95820 Peripheral vascular angioplasty status with implants and grafts: Secondary | ICD-10-CM

## 2015-01-03 DIAGNOSIS — I10 Essential (primary) hypertension: Secondary | ICD-10-CM | POA: Diagnosis not present

## 2015-01-03 DIAGNOSIS — I2510 Atherosclerotic heart disease of native coronary artery without angina pectoris: Secondary | ICD-10-CM | POA: Diagnosis not present

## 2015-01-03 DIAGNOSIS — E785 Hyperlipidemia, unspecified: Secondary | ICD-10-CM

## 2015-01-03 DIAGNOSIS — Z9889 Other specified postprocedural states: Secondary | ICD-10-CM

## 2015-01-03 NOTE — Progress Notes (Signed)
Cardiology Office Note   Date:  01/03/2015   ID:  Henry Curtis, DOB July 12, 1922, MRN [id number]  PCP:  Tommy Medal, MD  Cardiologist:  Dr. Stanford Breed    Chief Complaint  Patient presents with  . Coronary Artery Disease    no chest pain, no shortness of breath,occassional edema in ankles, no pain in legs, no cramping in legs, no lightheadedness, no dizziness      History of Present Illness: Henry Curtis is a 81 y.o. male who presents for CAD management.  Also a note from Dr. Diona Fanti pt needs Transurethral resection of bladder neck contracture.  Asking if plavix could be held.    Patient has had previous coronary artery bypass and graft in 1991 with a LIMA to the diagonal and LAD, saphenous vein graft to the PDA.Recently admitted with increased confusion and fatigue. Troponins were abnormal. Echocardiogram December 2015 showed an ejection fraction of 45-50% and moderate mitral regurgitation. Cardiac catheterization December 2015 showed an occluded LAD. There was a 95% lesion in the LAD distal to the LIMA insertion. There was also another 95% lesion distal to that. There are extensive collaterals to the distal LAD. The circumflex was occluded. There were bridging collaterals into the second and third marginal. The right coronary was occluded as was the saphenous vein graft to the right coronary artery. Patient had PCI of the distal LAD with drug-eluting stent and PTCA. Treated for Clostridium difficile colitis. Since discharge there is no dyspnea, chest pain, palpitations or syncope. No pedal edema. He continues to have occasional confusion. However it is much improved.  Recently has had ER visits for urinary retention with coud'e cather also with hematuria.    Past Medical History  Diagnosis Date  . Prostate cancer   . Hypertension   . Myocardial infarction   . Coronary artery disease involving native coronary artery 12/1989    Unstable Angina  . S/P CABG x 3 12/27/1989   LIMA-D1-LAD, SVG-rPDA (PDA Endarterectomy & patch andioplasty)     Past Surgical History  Procedure Laterality Date  . Appendectomy    . Multiple tooth extractions Bilateral   . Coronary artery bypass graft    . Left heart catheterization with coronary/graft angiogram N/A 06/25/2014    Procedure: LEFT HEART CATHETERIZATION WITH Beatrix Fetters;  Surgeon: Leonie Man, MD;  Location: [facility] CATH LAB;  Service: Cardiovascular;  Laterality: N/A;  . Cystoscopy with urethral dilatation N/A 12/21/2014    Procedure: CYSTOSCOPY WITH RETROGRADE AND URETHRAL DILATATION ;  Surgeon: Alexis Frock, MD;  Location: [facility] ORS;  Service: Urology;  Laterality: N/A;     Current Outpatient Prescriptions  Medication Sig Dispense Refill  . aspirin EC 325 MG EC tablet Take 1 tablet (325 mg total) by mouth daily. 30 tablet 0  . atorvastatin (LIPITOR) 80 MG tablet Take 1 tablet (80 mg total) by mouth daily at 6 PM. (Patient taking differently: Take 40 mg by mouth daily. ) 30 tablet 0  . clopidogrel (PLAVIX) 75 MG tablet Take 1 tablet (75 mg total) by mouth daily with breakfast. 30 tablet 0  . feeding supplement, ENSURE COMPLETE, (ENSURE COMPLETE) LIQD Take 237 mLs by mouth daily at 3 pm. 30 Bottle 0  . losartan (COZAAR) 100 MG tablet Take 100 mg by mouth daily.    . metoprolol tartrate (LOPRESSOR) 12.5 mg TABS tablet Take 0.5 tablets (12.5 mg total) by mouth 2 (two) times daily. 60 tablet 0  . Probiotic Product (PROBIOTIC & ACIDOPHILUS EX ST PO) Take  1 tablet by mouth.     No current facility-administered medications for this visit.    Allergies:   Review of patient's allergies indicates no known allergies.    Social History:  The patient  reports that he has quit smoking. He does not have any smokeless tobacco history on file. He reports that he does not drink alcohol or use illicit drugs.   Family History:  The patient's family history includes Deep vein thrombosis in his father; Diabetes in his  father.    ROS:  General:no colds or fevers, no weight changes Skin:no rashes or ulcers HEENT:no blurred vision, no congestion CV:see HPI PUL:see HPI GI:no diarrhea constipation or melena, no indigestion GU:+ hematuria- see above, no dysuria, has been doing well for last 2 weeks MS:no joint pain, no claudication Neuro:no syncope, no lightheadedness Endo:no diabetes, no thyroid disease  Wt Readings from Last 3 Encounters:  01/03/15 174 lb 4.8 oz (79.062 kg)  12/21/14 166 lb 3.6 oz (75.4 kg)  07/12/14 170 lb 12.8 oz (77.474 kg)     PHYSICAL EXAM: VS:  BP 140/72 mmHg  Pulse 58  Ht 5\' 9"  (1.753 m)  Wt 174 lb 4.8 oz (79.062 kg)  BMI 25.73 kg/m2 , BMI Body mass index is 25.73 kg/(m^2). General:Pleasant affect, NAD Skin:Warm and dry, brisk capillary refill HEENT:normocephalic, sclera clear, mucus membranes moist Neck:supple, no JVD, no bruits  Heart:S1S2 RRR without murmur, gallup, rub or click Lungs:clear without rales, rhonchi, or wheezes VWU:JWJX, non tender, + BS, do not palpate liver spleen or masses Ext:no lower ext edema, 2+ pedal pulses, 2+ radial pulses Neuro:alert and oriented, MAE, follows commands, + facial symmetry    EKG:  EKG is NOT rdered today.   Recent Labs: 06/24/2014: ALT 15; TSH 2.830 06/26/2014: Magnesium 1.9 12/21/2014: BUN 37*; Creatinine, Ser 1.85*; Hemoglobin 12.6*; Platelets 163; Potassium 3.9; Sodium 138    Lipid Panel    Component Value Date/Time   CHOL 147 06/24/2014 0500   TRIG 57 06/24/2014 0500   HDL 42 06/24/2014 0500   CHOLHDL 3.5 06/24/2014 0500   VLDL 11 06/24/2014 0500   LDLCALC 94 06/24/2014 0500       Other studies Reviewed: Additional studies/ records that were reviewed today include: previous notes.   ASSESSMENT AND PLAN:  1.  CAD s/p CABG 12/1989, 06/2015 Difficult, complex Percutaneous Coronary Intervention of the native LAD through the sequential LIMA-D1-LAD graft with placement of a single Xience Alpine DES 2.5 x 18  mm stent for the mid LAD lesion just after LIMA anastomosis and PTCA only of the distal LAD due to inability to pass a stent  Needs dual antiplatelet therapy for 1 year.  Pt to follow up in 5 months with Dr. Thresa Ross unless problems prior to that time.   2. Bladder obstruction with need for Transurethral resection of bladder neck contracture. Pt would need to be off plavix for 7 days prior to surgery.  Planned for 01/17/15.  Will discuss with Dr. Stanford Breed.  ? Admit and place on angiomax until surgery.  3. Essential htn stable  4. Hyperlipidemia lipids through Dr. Wilmon Pali office  Will call for recent labs pt on 40 of Lipitor now has been decreased from 80 mg.    Current medicines are reviewed with the patient today.  The patient Has no concerns regarding medicines.  The following changes have been made:  See above Labs/ tests ordered today include:see above  Disposition:   FU:  see above  Signed, Aryssa Rosamond R,  NP  01/03/2015 6:24 PM    [address] [place], [place] Phone: [phone]; Fax: [phone]

## 2015-01-03 NOTE — Patient Instructions (Addendum)
Continue same medications   Your physician wants you to follow-up in: 5 months with Dr.Crenshaw. You will receive a reminder letter in the mail two months in advance. If you don't receive a letter, please call our office to schedule the follow-up appointment.

## 2015-01-06 ENCOUNTER — Telehealth: Payer: Self-pay | Admitting: *Deleted

## 2015-01-06 NOTE — Telephone Encounter (Signed)
If necessary plavix can be held for procedure and resumed after but asa must be continued as 6 months since pci    Kirk Ruths        ----- Message -----     From: Isaiah Serge, NP     Sent: 01/03/2015  6:42 PM      To: Lelon Perla, MD        Pt has relatively new DES to mLAD and needs dual antiplatelet therapy for 1 year. Also needs transurethral resection of bladder neck contracture off Plavix. Will ask Dr. Stanford Breed to review for solution.

## 2015-01-06 NOTE — Telephone Encounter (Signed)
Spoke with pt, the surgeon will be dr Diona Fanti. Note will be sent to attn-selita @ [phone].

## 2015-01-17 ENCOUNTER — Ambulatory Visit: Admit: 2015-01-17 | Payer: Self-pay | Admitting: Urology

## 2015-01-17 ENCOUNTER — Encounter: Payer: Medicare Other | Admitting: Internal Medicine

## 2015-01-17 ENCOUNTER — Ambulatory Visit: Payer: Medicare Other | Admitting: Internal Medicine

## 2015-01-17 SURGERY — RESECTION, BLADDER NECK, TRANSURETHRAL
Anesthesia: General

## 2015-01-20 ENCOUNTER — Ambulatory Visit (INDEPENDENT_AMBULATORY_CARE_PROVIDER_SITE_OTHER): Payer: Medicare Other | Admitting: Neurology

## 2015-01-20 ENCOUNTER — Encounter: Payer: Self-pay | Admitting: Neurology

## 2015-01-20 VITALS — BP 116/58 | HR 62 | Resp 16 | Ht 69.0 in | Wt 176.4 lb

## 2015-01-20 DIAGNOSIS — R269 Unspecified abnormalities of gait and mobility: Secondary | ICD-10-CM | POA: Diagnosis not present

## 2015-01-20 DIAGNOSIS — R339 Retention of urine, unspecified: Secondary | ICD-10-CM | POA: Diagnosis not present

## 2015-01-20 DIAGNOSIS — E559 Vitamin D deficiency, unspecified: Secondary | ICD-10-CM

## 2015-01-20 DIAGNOSIS — R413 Other amnesia: Secondary | ICD-10-CM | POA: Diagnosis not present

## 2015-01-20 NOTE — Progress Notes (Signed)
[facility]  PATIENT: Henry Curtis DOB: [birth year]  REFERRING DOCTOR OR PCP:  Deland Pretty SOURCE: patient and records from Dr. Shelia Media  _________________________________   HISTORICAL  CHIEF COMPLAINT:  Chief Complaint  Patient presents with  . Memory Loss    Henry Curtis is here with his dtr. Kathie for eval of memory loss.  Kathie sts. they have noticed progressive memory loss over the last year.  He had an mi in December 2015 and memory seemed to rapidly decline since then.  He was started on a titration pk. of Namenda in May, but he began having hematuria--had to be hospitalized again.  They stopped Namenda, and hematuria has not returned.  He has a hx. of prostate cancer with radioactive see treatment in the 90's.  She reports that while he was taking Namenda,  . Memory Loss    his memory improved.  After stopping it, memory declined again.  Some more aggressive than normal behavior at night.  Dr. Shelia Media started him on Aricept last Monday./fim    HISTORY OF PRESENT ILLNESS:  I had the pleasure of seeing your patient, Henry Curtis, at [facility] for neurologic consultation regarding his memory difficulties. As you know, he is a 81 year old right-handed man who began to have difficulties with his memory in late 2014 that progressed even more in December 2015 when he was hospitalized cardiac reasons. He has difficulty with memory was having some problems with driving a couple months before he had the hospital stay for his heart.     He often repeats questions even though he has been told the answer is several times. He was placed on Namenda a couple months ago but during the fourth week had hematuria so the Namenda was stopped. His daughter believes that there was a fairly significant benefit. More recently he was started on Aricept he has not had any recent imaging studies of the brain.  He also has had difficulty with his walking that has  progressed further in the last 2 years. Walking began to worsen in 2009 after he had shingles. Due to the pain he was less active and he never recovered when he lost. His gait has been more shuffling over the past year or 2.  He also has had difficulty with his bladder area and earlier in 2015 he was noted to have some occasional incontinence. However, after his hospital stay for his cardiac catheterization he had even more issues with his bladder and required an indwelling catheter. A couple of catheter trials have been made to see if he can stop using them but he is unable to due to retention. Of note, he had prostate cancer with radioactive seeds and he is felt to possibly have scarring.  Henry Curtis usually sleeps well at night and has not had any major problem with insomnia. His daughter has not noted any recent snoring or pauses or gasping.  He had the [facility] cognitive assessment test today. He lost a couple points for visual spatial and executive function tasks but did well with naming and attention tasks he lost one point for language tasks, did well with abstraction but lost 5 points for delayed recall. However, he was able to get 4 out of 5 words correct with category or multiple choice cues. Orientation was also reduced, specifically the day month and year. His total score was 17/30.  REVIEW OF SYSTEMS: Constitutional: No fevers, chills, sweats, or change in appetite Eyes: No visual changes, double  vision, eye pain Ear, nose and throat: No hearing loss, ear pain, nasal congestion, sore throat Cardiovascular: recent MI in 12/15.   No current CP.   Respiratory: No shortness of breath at rest or with exertion.   No wheezes GastrointestinaI: No nausea, vomiting, diarrhea, abdominal pain, fecal incontinence Genitourinary: see above Musculoskeletal: No neck pain, back pain Integumentary: No rash, pruritus, skin lesions Neurological: as above Psychiatric: No depression at this time.  No  anxiety Endocrine: No palpitations, diaphoresis, change in appetite, change in weigh or increased thirst Hematologic/Lymphatic: No anemia, purpura, petechiae. Allergic/Immunologic: No itchy/runny eyes, nasal congestion, recent allergic reactions, rashes  ALLERGIES: No Known Allergies  HOME MEDICATIONS:  Current outpatient prescriptions:  .  aspirin 81 MG tablet, Take 81 mg by mouth daily., Disp: , Rfl:  .  atorvastatin (LIPITOR) 40 MG tablet, Take 40 mg by mouth daily., Disp: , Rfl:  .  clopidogrel (PLAVIX) 75 MG tablet, Take 1 tablet (75 mg total) by mouth daily with breakfast., Disp: 30 tablet, Rfl: 0 .  COCONUT OIL PO, Take by mouth. Take 2 tablets by mouth in the morning, Disp: , Rfl:  .  donepezil (ARICEPT) 5 MG tablet, Take 5 mg by mouth at bedtime., Disp: , Rfl:  .  feeding supplement, ENSURE COMPLETE, (ENSURE COMPLETE) LIQD, Take 237 mLs by mouth daily at 3 pm., Disp: 30 Bottle, Rfl: 0 .  losartan (COZAAR) 100 MG tablet, Take 100 mg by mouth daily., Disp: , Rfl:  .  metoprolol tartrate (LOPRESSOR) 25 MG tablet, Take 1/2 tablet by mouth twice a day, Disp: , Rfl:  .  Probiotic Product (PROBIOTIC & ACIDOPHILUS EX ST PO), Take 1 tablet by mouth., Disp: , Rfl:  .  aspirin EC 325 MG EC tablet, Take 1 tablet (325 mg total) by mouth daily. (Patient not taking: Reported on 01/20/2015), Disp: 30 tablet, Rfl: 0 .  tamsulosin (FLOMAX) 0.4 MG CAPS capsule, Take 0.4 mg by mouth at bedtime., Disp: , Rfl:   PAST MEDICAL HISTORY: Past Medical History  Diagnosis Date  . Prostate cancer   . Hypertension   . Myocardial infarction   . Coronary artery disease involving native coronary artery 12/1989    Unstable Angina  . S/P CABG x 3 12/27/1989    LIMA-D1-LAD, SVG-rPDA (PDA Endarterectomy & patch andioplasty)   . High cholesterol   . Clostridium difficile infection     2015  . Memory difficulty   . Hearing difficulty   . Urinary (tract) obstruction   . Prostate cancer   . UTI (urinary tract  infection)   . Toenail fungus     PAST SURGICAL HISTORY: Past Surgical History  Procedure Laterality Date  . Appendectomy    . Multiple tooth extractions Bilateral   . Coronary artery bypass graft    . Left heart catheterization with coronary/graft angiogram N/A 06/25/2014    Procedure: LEFT HEART CATHETERIZATION WITH Beatrix Fetters;  Surgeon: Leonie Man, MD;  Location: [facility] CATH LAB;  Service: Cardiovascular;  Laterality: N/A;  . Cystoscopy with urethral dilatation N/A 12/21/2014    Procedure: CYSTOSCOPY WITH RETROGRADE AND URETHRAL DILATATION ;  Surgeon: Alexis Frock, MD;  Location: [facility] ORS;  Service: Urology;  Laterality: N/A;    FAMILY HISTORY: Family History  Problem Relation Age of Onset  . Diabetes Father   . Deep vein thrombosis Father   . Heart attack Brother   . Neuropathy Sister     SOCIAL HISTORY:  History   Social History  . Marital  Status: Widowed    Spouse Name: N/A  . Number of Children: N/A  . Years of Education: N/A   Occupational History  . Not on file.   Social History Main Topics  . Smoking status: Former Research scientist (life sciences)  . Smokeless tobacco: Not on file  . Alcohol Use: No  . Drug Use: No  . Sexual Activity: Not on file   Other Topics Concern  . Not on file   Social History Narrative   Diet heart healthy - sweets in moderation       Do you drink/ eat things with caffeine? No       Marital status: Widowed Twice                              What year were you married ?      Do you live in a house, apartment,assistred living, condo, trailer, etc.)? Home      Is it one or more stories? 2 stories      How many persons live in your home ? 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  [place]                      If not, do you want to discuss one?       Do you have signed POA?HPOA forms?  Yes               If so, please bring to your        appointment           PHYSICAL EXAM  Filed Vitals:   01/20/15 1451  BP: 116/58  Pulse: 62  Resp: 16  Height: 5\' 9"  (1.753 m)  Weight: 176 lb 6.4 oz (80.015 kg)    Body mass index is 26.04 kg/(m^2).   General: The patient is well-developed and well-nourished and in no acute distress  Neck: The neck is supple, no carotid bruits are noted.  The neck is nontender.  Cardiovascular: The heart has a regular rate and rhythm with a normal S1 and S2. There were no murmurs, gallops or rubs.     Neurologic Exam  Mental status:  See history of present illness for MOCA results.The patient is alert and not oriented to time.   Memory was 0/5 without hints but he did better with category and list prompts.   Cranial nerves: Extraocular movements are full. Pupils are equal, round, and reactive to light and accomodation.    Facial symmetry is present. There is good facial sensation to soft touch bilaterally.Facial strength is normal.  Trapezius and sternocleidomastoid strength is normal. No dysarthria is noted.  The tongue is midline, and the patient has symmetric elevation of the soft palate. No obvious hearing deficits are noted.  Motor:  Muscle bulk is normal.   Tone is normal. Strength is  5 / 5 in all 4  extremities.   Sensory: Sensory testing is intact to pinprick, soft touch and vibration sensation in all 4 extremities.  Coordination: Cerebellar testing reveals good finger-nose-finger and resuced heel-to-shin bilaterally.  Gait and station: Station is normal.   Gait is wide with a short stride. He turns 180 and 6 steps. He has mild retropulsion. Romberg is negative.   Reflexes: Deep tendon reflexes are symmetric and normal bilaterally.   Plantar responses are flexor.    DIAGNOSTIC DATA (LABS, IMAGING, TESTING) - I reviewed patient records,  labs, notes, testing and imaging myself where available.  Lab Results  Component Value Date   WBC 11.9* 12/21/2014   HGB 12.6* 12/21/2014   HCT 36.8* 12/21/2014   MCV 93.2 12/21/2014   PLT 163 12/21/2014      Component Value Date/Time   NA 138 12/21/2014 0740   K 3.9 12/21/2014 0740   CL 111 12/21/2014 0740   CO2 20* 12/21/2014 0740   GLUCOSE 183* 12/21/2014 0740   BUN 37* 12/21/2014 0740   CREATININE 1.85* 12/21/2014 0740   CALCIUM 9.0 12/21/2014 0740   PROT 6.0 06/24/2014 0406   ALBUMIN 2.6* 06/24/2014 0406   AST 28 06/24/2014 0406   ALT 15 06/24/2014 0406   ALKPHOS 52 06/24/2014 0406   BILITOT 0.8 06/24/2014 0406   GFRNONAA 30* 12/21/2014 0740   GFRAA 35* 12/21/2014 0740   Lab Results  Component Value Date   CHOL 147 06/24/2014   HDL 42 06/24/2014   LDLCALC 94 06/24/2014   TRIG 57 06/24/2014   CHOLHDL 3.5 06/24/2014   Lab Results  Component Value Date   HGBA1C 5.2 06/24/2014   No results found for: VITAMINB12 Lab Results  Component Value Date   TSH 2.830 06/24/2014       ASSESSMENT AND PLAN  Memory loss - Plan: CT Head Wo Contrast, Vit D  25 hydroxy (rtn osteoporosis monitoring), TSH, T4, Vitamin B12  Urinary retention - Plan: CT Head Wo Contrast, Vit D  25 hydroxy (rtn osteoporosis monitoring), TSH, T4, Vitamin B12  Gait disturbance - Plan: CT Head Wo Contrast, Vit D  25 hydroxy (rtn osteoporosis monitoring), TSH, T4, Vitamin B12  Vitamin D deficiency - Plan: Vit D  25 hydroxy (rtn osteoporosis monitoring)   In summary, Henry Curtis is a 81 year old man with memory difficulties that started over a year ago but intensified after his  MI last December. He also has progressive difficulties with his gait and bladder difficulties. His performance on the [facility] cognitive assessment was not classic for Alzheimer's though he still was in the dementia range. Dependent about other etiologies. Typically he could have multi-infarct dementia or normal pressure  hydrocephalus. I will obtain a CT scan to help assess for these possibilities.   NPH  And multi-infarct can also cause bladder difficulties and gait disturbance.    I will also check blood work including TSH, T4, B12, vitamin D to make sure that there is not a treatable source dementia.  He will return to see me in about 3 months or sooner if there are new or worsening symptoms. In the interim, we will call the daughter with the results of his studies.   Pamela Maddy A. Felecia Shelling, MD, PhD 2/94/7654, 6:50 PM Certified in Neurology, Clinical Neurophysiology, Sleep Medicine, Pain Medicine and Neuroimaging  [facility] [address], [place],  [address] [phone]

## 2015-01-21 LAB — VITAMIN B12: VITAMIN B 12: 303 pg/mL (ref 211–946)

## 2015-01-21 LAB — T4: T4 TOTAL: 5 ug/dL (ref 4.5–12.0)

## 2015-01-21 LAB — VITAMIN D 25 HYDROXY (VIT D DEFICIENCY, FRACTURES): VIT D 25 HYDROXY: 28.1 ng/mL — AB (ref 30.0–100.0)

## 2015-01-21 LAB — TSH: TSH: 2.07 u[IU]/mL (ref 0.450–4.500)

## 2015-01-25 ENCOUNTER — Telehealth: Payer: Self-pay | Admitting: *Deleted

## 2015-01-25 ENCOUNTER — Ambulatory Visit
Admission: RE | Admit: 2015-01-25 | Discharge: 2015-01-25 | Disposition: A | Discharge status: Home / Self Care | Payer: Medicare Other | Source: Ambulatory Visit | Attending: Neurology | Admitting: Neurology

## 2015-01-25 ENCOUNTER — Telehealth: Payer: Self-pay | Admitting: Internal Medicine

## 2015-01-25 DIAGNOSIS — R339 Retention of urine, unspecified: Secondary | ICD-10-CM

## 2015-01-25 DIAGNOSIS — R269 Unspecified abnormalities of gait and mobility: Secondary | ICD-10-CM

## 2015-01-25 DIAGNOSIS — R413 Other amnesia: Secondary | ICD-10-CM | POA: Diagnosis not present

## 2015-01-25 NOTE — Telephone Encounter (Signed)
I have spoken with dtr. Kathie this morning and per RAS, advised that vit. d level mildly low; he should take otc vit. d 1,000iu daily.  She verbalized understanding of same/fim

## 2015-01-25 NOTE — Telephone Encounter (Signed)
-----   Message from Britt Bottom, MD sent at 01/24/2015  2:24 PM EDT ----- Vit d is mildly low..   Other labs ok.   Take 1000 U OTC daily

## 2015-01-25 NOTE — Telephone Encounter (Signed)
Spoke with patients wife. Informed her new Patient package will be on file for a period of 3 months. If she decides to transfer patients care..cdavis  (see basket in MR) cdavis

## 2015-01-27 ENCOUNTER — Ambulatory Visit: Payer: Medicare Other | Admitting: Internal Medicine

## 2015-01-27 ENCOUNTER — Telehealth: Payer: Self-pay | Admitting: *Deleted

## 2015-01-27 NOTE — Telephone Encounter (Signed)
-----   Message from Britt Bottom, MD sent at 01/27/2015 12:35 PM EDT ----- Please let me know that the CT scan looks the same as the one from last year. Most of the labs are fine but his vitamin D is a little bit low and I would like him to take 2000 units OTC daily.

## 2015-01-27 NOTE — Telephone Encounter (Signed)
LMTC./fim 

## 2015-02-01 ENCOUNTER — Telehealth: Payer: Self-pay | Admitting: Neurology

## 2015-02-01 NOTE — Telephone Encounter (Signed)
I have spoken with dtr. Kathie and per RAS, advised that recent ct was unchanged from ct last yr. Kathie verbalized understanding of same.  I addressed vit. d deficiency in a previous phone call--see earlier telephone encounter./fim

## 2015-02-01 NOTE — Telephone Encounter (Signed)
Patient's wife is calling and states that someone from here called with blood work results and spoke with her husband.  IF you are the person who called would you please call the wife back.  Thanks!

## 2015-02-01 NOTE — Telephone Encounter (Signed)
-----   Message from Britt Bottom, MD sent at 01/27/2015 12:35 PM EDT ----- Please let me know that the CT scan looks the same as the one from last year. Most of the labs are fine but his vitamin D is a little bit low and I would like him to take 2000 units OTC daily.

## 2015-02-01 NOTE — Telephone Encounter (Signed)
Duplicate task--see result note/fim

## 2015-02-04 ENCOUNTER — Ambulatory Visit: Payer: Medicare Other | Admitting: Podiatry

## 2015-02-09 ENCOUNTER — Encounter: Payer: Self-pay | Admitting: Cardiology

## 2015-02-18 ENCOUNTER — Encounter: Payer: Self-pay | Admitting: Podiatry

## 2015-02-18 ENCOUNTER — Ambulatory Visit (INDEPENDENT_AMBULATORY_CARE_PROVIDER_SITE_OTHER): Payer: Medicare Other | Admitting: Podiatry

## 2015-02-18 VITALS — BP 122/56 | HR 62 | Resp 15

## 2015-02-18 DIAGNOSIS — B351 Tinea unguium: Secondary | ICD-10-CM

## 2015-02-18 DIAGNOSIS — M79676 Pain in unspecified toe(s): Secondary | ICD-10-CM

## 2015-02-18 NOTE — Progress Notes (Signed)
Patient ID: Henry Curtis, male   DOB: 10-16-1921, 81 y.o.   MRN: [id number]  Subjective: 81 y.o.-year-old male returns the office today for painful, elongated, thickened toenails which she is unable to trim himself. Denies any redness or drainage around the nails. Denies any acute changes since last appointment and no new complaints today. Denies any systemic complaints such as fevers, chills, nausea, vomiting.   Objective: AAO 3, NAD DP/PT pulses palpable, CRT less than 3 seconds Nails hypertrophic, dystrophic, elongated, brittle, discolored 10. There is tenderness overlying the nails 1-5 bilaterally. There is no surrounding erythema or drainage along the nail sites. No open lesions or pre-ulcerative lesions are identified. No other areas of tenderness bilateral lower extremities. No overlying edema, erythema, increased warmth. No pain with calf compression, swelling, warmth, erythema.  Assessment: Patient presents with symptomatic onychomycosis  Plan: -Treatment options including alternatives, risks, complications were discussed -Nails sharply debrided 10 without complication/bleeding. -Discussed daily foot inspection. If there are any changes, to call the office immediately.  -Follow-up in 3 months or sooner if any problems are to arise. In the meantime, encouraged to call the office with any questions, concerns, changes symptoms.  Celesta Gentile, DPM

## 2015-02-21 ENCOUNTER — Encounter: Payer: Self-pay | Admitting: Podiatry

## 2015-02-22 ENCOUNTER — Encounter: Payer: Self-pay | Admitting: Cardiology

## 2015-03-29 ENCOUNTER — Other Ambulatory Visit: Payer: Self-pay | Admitting: Urology

## 2015-03-29 DIAGNOSIS — C61 Malignant neoplasm of prostate: Secondary | ICD-10-CM

## 2015-04-22 ENCOUNTER — Ambulatory Visit (INDEPENDENT_AMBULATORY_CARE_PROVIDER_SITE_OTHER): Payer: Medicare Other | Admitting: Podiatry

## 2015-04-22 ENCOUNTER — Encounter: Payer: Self-pay | Admitting: Podiatry

## 2015-04-22 DIAGNOSIS — M79676 Pain in unspecified toe(s): Secondary | ICD-10-CM

## 2015-04-22 DIAGNOSIS — B351 Tinea unguium: Secondary | ICD-10-CM | POA: Diagnosis not present

## 2015-04-22 NOTE — Progress Notes (Signed)
Patient ID: Henry Curtis, male   DOB: 1921-11-02, 81 y.o.   MRN: [id number]  Subjective: 81 y.o. returns the office today for painful, elongated, thickened toenails which he is unable to trim himself. Denies any redness or drainage around the nails. Denies any acute changes since last appointment and no new complaints today. Denies any systemic complaints such as fevers, chills, nausea, vomiting.   Objective: AAO 3, NAD DP/PT pulses palpable 1/4, CRT less than 3 seconds Nails hypertrophic, dystrophic, elongated, brittle, discolored 10. There is tenderness overlying the nails 1-5 bilaterally. There is no surrounding erythema or drainage along the nail sites. No open lesions or pre-ulcerative lesions are identified. No other areas of tenderness bilateral lower extremities. No overlying edema, erythema, increased warmth. No pain with calf compression, swelling, warmth, erythema.  Assessment: Patient presents with symptomatic onychomycosis  Plan: -Treatment options including alternatives, risks, complications were discussed -Nails sharply debrided 10 without complication/bleeding. -Discussed daily foot inspection. If there are any changes, to call the office immediately.  -Follow-up in 3 months or sooner if any problems are to arise. In the meantime, encouraged to call the office with any questions, concerns, changes symptoms.  Celesta Gentile, DPM

## 2015-04-25 ENCOUNTER — Ambulatory Visit (INDEPENDENT_AMBULATORY_CARE_PROVIDER_SITE_OTHER): Payer: Medicare Other | Admitting: Neurology

## 2015-04-25 ENCOUNTER — Encounter: Payer: Self-pay | Admitting: Neurology

## 2015-04-25 ENCOUNTER — Telehealth: Payer: Self-pay | Admitting: Neurology

## 2015-04-25 VITALS — BP 116/64 | HR 62 | Resp 16 | Ht 69.0 in | Wt 176.4 lb

## 2015-04-25 DIAGNOSIS — R413 Other amnesia: Secondary | ICD-10-CM | POA: Diagnosis not present

## 2015-04-25 DIAGNOSIS — R339 Retention of urine, unspecified: Secondary | ICD-10-CM | POA: Diagnosis not present

## 2015-04-25 DIAGNOSIS — R269 Unspecified abnormalities of gait and mobility: Secondary | ICD-10-CM | POA: Diagnosis not present

## 2015-04-25 MED ORDER — DONEPEZIL HCL 10 MG PO TABS: 10.0000 mg | ORAL_TABLET | Freq: Every day | ORAL | Status: DC

## 2015-04-25 NOTE — Telephone Encounter (Signed)
Received fax confirmation/fim

## 2015-04-25 NOTE — Patient Instructions (Signed)
We will increase the donepezil from 5 mg to 10 mg daily.  Continue to be active.  Walk with a cane for safety.

## 2015-04-25 NOTE — Telephone Encounter (Signed)
Per dtr's request, rx. faxed to the [facility] fax # [phone]. Office notes faxed as well./fim

## 2015-04-25 NOTE — Telephone Encounter (Signed)
Pt's daughter called stating donepezil (ARICEPT) 10 MG tablet needs to be faxed to [place] at [phone].Please put pt's name, last 4 of social security # and progress notes as to why script was changed.  She is requesting this to be done today as it takes the [place] a long time to get things done. Please call if have any questions arise.

## 2015-04-25 NOTE — Progress Notes (Signed)
[facility]  PATIENT: Henry Curtis DOB: May 08, 1922  REFERRING DOCTOR OR PCP:  Deland Pretty SOURCE: patient and records from Dr. Shelia Media  _________________________________   HISTORICAL  CHIEF COMPLAINT:  Chief Complaint  Patient presents with  . Memory Loss    Henry Curtis is here with his caregiver (not a relative)--Tabitha.  Henry Curtis denies changes since last ov--"about the same--I'm doing good."  His vitamin d level was a little low at last ov.  Henry Curtis confirms that he is taking otc vit. d 1,000iu daily./fim    HISTORY OF PRESENT ILLNESS:  Henry Curtis is a 81 year old right-handed man with memory and gait disturbances.      Memory:     He has had issues with short term memory x 2 years.  In December 2015 he was hospitalized cardiac reasons and felt he progressed.   He stopped driving last year.  MOCA at last visit was 17/30.   He was placed on Namenda earlier in the year ago but during the fourth week, he had hematuria and it was stopped. He may have had some benefit but there may also have been added confusion as he pulled his catheter out at least once during that interval, according to the caregiver.  He has been on Aricept for the past several months. His dose is 5 mg and he tolerates it well. Although definite improvement has not been noted, he may be having more good days than bad days.    CT  In July shows age related atrophy and small vessel changes.   He sleeps well at night.     Gait:  He has had progressive difficulty with his walking x several years. Walking began to worsen in 2009 after he had shingles. His gait has been more shuffling over the past year or 2-3 years.  He has needed to use a cane intermittently since 2009 but more frequently this year.    He is able to walk without the cane in the house.   No recent falls.  Bladder:      Since his hospital stay for his cardiac catheterization (06/2014) he had more issues with his bladder and required an  indwelling catheter.Due to retention, he has not been able to be weaned.   Of note, he had prostate cancer with radioactive seeds and he is felt to possibly have scarring.   REVIEW OF SYSTEMS: Constitutional: No fevers, chills, sweats, or change in appetite Eyes: No visual changes, double vision, eye pain Ear, nose and throat: No hearing loss, ear pain, nasal congestion, sore throat Cardiovascular: recent MI in 12/15.   No current CP.   Respiratory: No shortness of breath at rest or with exertion.   No wheezes GastrointestinaI: No nausea, vomiting, diarrhea, abdominal pain, fecal incontinence Genitourinary: see above Musculoskeletal: No neck pain, back pain Integumentary: No rash, pruritus, skin lesions Neurological: as above Psychiatric: No depression at this time.  No anxiety Endocrine: No palpitations, diaphoresis, change in appetite, change in weigh or increased thirst Hematologic/Lymphatic: No anemia, purpura, petechiae. Allergic/Immunologic: No itchy/runny eyes, nasal congestion, recent allergic reactions, rashes  ALLERGIES: No Known Allergies  HOME MEDICATIONS:  Current outpatient prescriptions:  .  aspirin 81 MG tablet, Take 81 mg by mouth daily., Disp: , Rfl:  .  atorvastatin (LIPITOR) 40 MG tablet, Take 40 mg by mouth daily., Disp: , Rfl:  .  clopidogrel (PLAVIX) 75 MG tablet, Take 1 tablet (75 mg total) by mouth daily with breakfast., Disp: 30 tablet,  Rfl: 0 .  COCONUT OIL PO, Take by mouth. Take 2 tablets by mouth in the morning, Disp: , Rfl:  .  donepezil (ARICEPT) 5 MG tablet, Take 5 mg by mouth at bedtime., Disp: , Rfl:  .  feeding supplement, ENSURE COMPLETE, (ENSURE COMPLETE) LIQD, Take 237 mLs by mouth daily at 3 pm., Disp: 30 Bottle, Rfl: 0 .  losartan (COZAAR) 100 MG tablet, Take 100 mg by mouth daily., Disp: , Rfl:  .  metoprolol tartrate (LOPRESSOR) 25 MG tablet, Take 1/2 tablet by mouth twice a day, Disp: , Rfl:  .  Probiotic Product (PROBIOTIC &  ACIDOPHILUS EX ST PO), Take 1 tablet by mouth., Disp: , Rfl:  .  aspirin EC 325 MG EC tablet, Take 1 tablet (325 mg total) by mouth daily. (Patient not taking: Reported on 04/25/2015), Disp: 30 tablet, Rfl: 0 .  tamsulosin (FLOMAX) 0.4 MG CAPS capsule, Take 0.4 mg by mouth at bedtime., Disp: , Rfl:   PAST MEDICAL HISTORY: Past Medical History  Diagnosis Date  . Prostate cancer ([place])   . Hypertension   . Myocardial infarction ([place])   . Coronary artery disease involving native coronary artery 12/1989    Unstable Angina  . S/P CABG x 3 12/27/1989    LIMA-D1-LAD, SVG-rPDA (PDA Endarterectomy & patch andioplasty)   . High cholesterol   . Clostridium difficile infection     2015  . Memory difficulty   . Hearing difficulty   . Urinary (tract) obstruction   . Prostate cancer ([place])   . UTI (urinary tract infection)   . Toenail fungus     PAST SURGICAL HISTORY: Past Surgical History  Procedure Laterality Date  . Appendectomy    . Multiple tooth extractions Bilateral   . Coronary artery bypass graft    . Left heart catheterization with coronary/graft angiogram N/A 06/25/2014    Procedure: LEFT HEART CATHETERIZATION WITH Beatrix Fetters;  Surgeon: Leonie Man, MD;  Location: [facility] CATH LAB;  Service: Cardiovascular;  Laterality: N/A;  . Cystoscopy with urethral dilatation N/A 12/21/2014    Procedure: CYSTOSCOPY WITH RETROGRADE AND URETHRAL DILATATION ;  Surgeon: Alexis Frock, MD;  Location: [facility] ORS;  Service: Urology;  Laterality: N/A;    FAMILY HISTORY: Family History  Problem Relation Age of Onset  . Diabetes Father   . Deep vein thrombosis Father   . Heart attack Brother   . Neuropathy Sister     SOCIAL HISTORY:  Social History   Social History  . Marital Status: Widowed    Spouse Name: N/A  . Number of Children: N/A  . Years of Education: N/A   Occupational History  . Not on file.   Social History Main Topics  . Smoking status: Former Research scientist (life sciences)  . Smokeless  tobacco: Not on file  . Alcohol Use: No  . Drug Use: No  . Sexual Activity: Not on file   Other Topics Concern  . Not on file   Social History Narrative   Diet heart healthy - sweets in moderation       Do you drink/ eat things with caffeine? No       Marital status: Widowed Twice                              What year were you married ?      Do you live in a house, apartment,assistred living, condo, trailer, etc.)? Home  Is it one or more stories? 2 stories      How many persons live in your home ? 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  [place]                     If not, do you want to discuss one?       Do you have signed POA?HPOA forms?  Yes               If so, please bring to your        appointment           PHYSICAL EXAM  Filed Vitals:   04/25/15 1032  BP: 116/64  Pulse: 62  Resp: 16  Height: 5\' 9"  (1.753 m)  Weight: 176 lb 6.4 oz (80.015 kg)    Body mass index is 26.04 kg/(m^2).   General: The patient is well-developed and well-nourished and in no acute distress   Neurologic Exam  Mental status:  The patient is alert and not oriented to time.   Short term memory was 1/3 without hints but 3/3 with category prompts.  Attention was reduced.  Cranial nerves: Extraocular movements are full.    Facial symmetry is present. There is good facial sensation to soft touch bilaterally.Facial strength is normal.  Trapezius and sternocleidomastoid strength is normal. No dysarthria is noted.    No obvious hearing deficits are noted.  Motor:  Muscle bulk is normal.   Tone is normal. Strength is  5 / 5 in all 4 extremities.   Sensory: Sensory testing is intact to pinprick, soft touch and vibration sensation in all 4  extremities.  Coordination: Cerebellar testing reveals good finger-nose-finger and reduced symmetric heel-to-shin bilaterally.  Gait and station: Station is normal.   Gait is wide with a short stride. He turns 180 in 5 steps (was 6 at last visit). He has mild retropulsion. Romberg is negative.   Reflexes: Deep tendon reflexes are symmetric and normal bilaterally.       DIAGNOSTIC DATA (LABS, IMAGING, TESTING) - I reviewed patient records, labs, notes, testing and imaging myself where available.  Lab Results  Component Value Date   WBC 11.9* 12/21/2014   HGB 12.6* 12/21/2014   HCT 36.8* 12/21/2014   MCV 93.2 12/21/2014   PLT 163 12/21/2014      Component Value Date/Time   NA 138 12/21/2014 0740   K 3.9 12/21/2014 0740   CL 111 12/21/2014 0740   CO2 20* 12/21/2014 0740   GLUCOSE 183* 12/21/2014 0740   BUN 37* 12/21/2014 0740   CREATININE 1.85* 12/21/2014 0740   CALCIUM 9.0 12/21/2014 0740   PROT 6.0 06/24/2014 0406   ALBUMIN 2.6* 06/24/2014 0406   AST 28 06/24/2014 0406   ALT 15 06/24/2014 0406   ALKPHOS 52 06/24/2014 0406   BILITOT 0.8 06/24/2014 0406   GFRNONAA 30* 12/21/2014 0740   GFRAA 35* 12/21/2014 0740   Lab  Results  Component Value Date   CHOL 147 06/24/2014   HDL 42 06/24/2014   LDLCALC 94 06/24/2014   TRIG 57 06/24/2014   CHOLHDL 3.5 06/24/2014   Lab Results  Component Value Date   HGBA1C 5.2 06/24/2014   Lab Results  Component Value Date   VITAMINB12 303 01/20/2015   Lab Results  Component Value Date   TSH 2.070 01/20/2015       ASSESSMENT AND PLAN  Memory loss  Gait disturbance  Urinary retention   1.  He will continue to use a cane when he walks for safety. Gait is stable but if it worsens he may need to start using a walker for added safety. 2.   I will increase the donepezil from 5-10 mg. 3.   Return to see me in 6 months or sooner if there are new or worsening neurologic symptoms.  Richard A. Felecia Shelling, MD, PhD 60/12/3014, 01:09  AM Certified in Neurology, Clinical Neurophysiology, Sleep Medicine, Pain Medicine and Neuroimaging  [facility] [address] [place], [place] [address] [phone]

## 2015-04-27 NOTE — Telephone Encounter (Signed)
Henry Curtis with [name] called said she has not rec'd fax. The fax number is correct and it is right outside her door. Please fax again.

## 2015-04-27 NOTE — Telephone Encounter (Signed)
Rx. with office notes re-faxed to the [place], fax # [phone], with fax confirmation received./fim

## 2015-04-29 ENCOUNTER — Ambulatory Visit: Payer: Medicare Other | Admitting: Podiatry

## 2015-05-13 ENCOUNTER — Emergency Department (HOSPITAL_COMMUNITY)
Admission: EM | Admit: 2015-05-13 | Discharge: 2015-05-14 | Disposition: A | Discharge status: Home / Self Care | Payer: Medicare Other | Attending: Emergency Medicine | Admitting: Emergency Medicine

## 2015-05-13 ENCOUNTER — Encounter (HOSPITAL_COMMUNITY): Payer: Self-pay | Admitting: Emergency Medicine

## 2015-05-13 DIAGNOSIS — Z79899 Other long term (current) drug therapy: Secondary | ICD-10-CM | POA: Insufficient documentation

## 2015-05-13 DIAGNOSIS — Z87448 Personal history of other diseases of urinary system: Secondary | ICD-10-CM | POA: Insufficient documentation

## 2015-05-13 DIAGNOSIS — T83098A Other mechanical complication of other indwelling urethral catheter, initial encounter: Secondary | ICD-10-CM | POA: Diagnosis not present

## 2015-05-13 DIAGNOSIS — I10 Essential (primary) hypertension: Secondary | ICD-10-CM | POA: Insufficient documentation

## 2015-05-13 DIAGNOSIS — Z87440 Personal history of urinary (tract) infections: Secondary | ICD-10-CM | POA: Diagnosis not present

## 2015-05-13 DIAGNOSIS — Z7902 Long term (current) use of antithrombotics/antiplatelets: Secondary | ICD-10-CM | POA: Diagnosis not present

## 2015-05-13 DIAGNOSIS — Y846 Urinary catheterization as the cause of abnormal reaction of the patient, or of later complication, without mention of misadventure at the time of the procedure: Secondary | ICD-10-CM | POA: Diagnosis not present

## 2015-05-13 DIAGNOSIS — Z87891 Personal history of nicotine dependence: Secondary | ICD-10-CM | POA: Diagnosis not present

## 2015-05-13 DIAGNOSIS — Z8619 Personal history of other infectious and parasitic diseases: Secondary | ICD-10-CM | POA: Insufficient documentation

## 2015-05-13 DIAGNOSIS — H9190 Unspecified hearing loss, unspecified ear: Secondary | ICD-10-CM | POA: Insufficient documentation

## 2015-05-13 DIAGNOSIS — I25110 Atherosclerotic heart disease of native coronary artery with unstable angina pectoris: Secondary | ICD-10-CM | POA: Diagnosis not present

## 2015-05-13 DIAGNOSIS — R319 Hematuria, unspecified: Secondary | ICD-10-CM | POA: Diagnosis present

## 2015-05-13 DIAGNOSIS — Z951 Presence of aortocoronary bypass graft: Secondary | ICD-10-CM | POA: Diagnosis not present

## 2015-05-13 DIAGNOSIS — Z7982 Long term (current) use of aspirin: Secondary | ICD-10-CM | POA: Insufficient documentation

## 2015-05-13 DIAGNOSIS — I252 Old myocardial infarction: Secondary | ICD-10-CM | POA: Diagnosis not present

## 2015-05-13 DIAGNOSIS — Z9889 Other specified postprocedural states: Secondary | ICD-10-CM | POA: Insufficient documentation

## 2015-05-13 DIAGNOSIS — E785 Hyperlipidemia, unspecified: Secondary | ICD-10-CM | POA: Diagnosis not present

## 2015-05-13 DIAGNOSIS — Y828 Other medical devices associated with adverse incidents: Secondary | ICD-10-CM | POA: Insufficient documentation

## 2015-05-13 DIAGNOSIS — Z8546 Personal history of malignant neoplasm of prostate: Secondary | ICD-10-CM | POA: Insufficient documentation

## 2015-05-13 DIAGNOSIS — T83091A Other mechanical complication of indwelling urethral catheter, initial encounter: Secondary | ICD-10-CM

## 2015-05-13 LAB — PROTIME-INR
INR: 1.11 (ref 0.00–1.49)
Prothrombin Time: 14.5 seconds (ref 11.6–15.2)

## 2015-05-13 LAB — URINALYSIS, ROUTINE W REFLEX MICROSCOPIC
Bilirubin Urine: NEGATIVE
Glucose, UA: NEGATIVE mg/dL
Ketones, ur: 40 mg/dL — AB
NITRITE: NEGATIVE
Specific Gravity, Urine: 1.025 (ref 1.005–1.030)
UROBILINOGEN UA: 1 mg/dL (ref 0.0–1.0)
pH: 6 (ref 5.0–8.0)

## 2015-05-13 LAB — CBC WITH DIFFERENTIAL/PLATELET
BASOS ABS: 0 10*3/uL (ref 0.0–0.1)
Basophils Relative: 0 %
Eosinophils Absolute: 0.3 10*3/uL (ref 0.0–0.7)
Eosinophils Relative: 4 %
HEMATOCRIT: 37.8 % — AB (ref 39.0–52.0)
Hemoglobin: 13.1 g/dL (ref 13.0–17.0)
LYMPHS PCT: 24 %
Lymphs Abs: 2.1 10*3/uL (ref 0.7–4.0)
MCH: 31.6 pg (ref 26.0–34.0)
MCHC: 34.7 g/dL (ref 30.0–36.0)
MCV: 91.3 fL (ref 78.0–100.0)
Monocytes Absolute: 0.9 10*3/uL (ref 0.1–1.0)
Monocytes Relative: 11 %
NEUTROS ABS: 5.4 10*3/uL (ref 1.7–7.7)
Neutrophils Relative %: 61 %
PLATELETS: 172 10*3/uL (ref 150–400)
RBC: 4.14 MIL/uL — AB (ref 4.22–5.81)
RDW: 13.7 % (ref 11.5–15.5)
WBC: 8.8 10*3/uL (ref 4.0–10.5)

## 2015-05-13 LAB — BASIC METABOLIC PANEL
ANION GAP: 6 (ref 5–15)
BUN: 31 mg/dL — ABNORMAL HIGH (ref 6–20)
CO2: 26 mmol/L (ref 22–32)
Calcium: 9.5 mg/dL (ref 8.9–10.3)
Chloride: 110 mmol/L (ref 101–111)
Creatinine, Ser: 1.3 mg/dL — ABNORMAL HIGH (ref 0.61–1.24)
GFR, EST AFRICAN AMERICAN: 54 mL/min — AB (ref >60)
GFR, EST NON AFRICAN AMERICAN: 47 mL/min — AB (ref >60)
GLUCOSE: 104 mg/dL — AB (ref 65–99)
POTASSIUM: 4.6 mmol/L (ref 3.5–5.1)
Sodium: 142 mmol/L (ref 135–145)

## 2015-05-13 LAB — URINE MICROSCOPIC-ADD ON

## 2015-05-13 MED ORDER — MORPHINE SULFATE (PF) 2 MG/ML IV SOLN
2.0000 mg | Freq: Once | INTRAVENOUS | Status: AC
  Administered 2015-05-13: 2 mg via INTRAVENOUS
  Filled 2015-05-13: qty 1

## 2015-05-13 MED ORDER — SODIUM CHLORIDE 0.9 % IV SOLN
INTRAVENOUS | Status: DC
  Administered 2015-05-13: 20:00:00 via INTRAVENOUS

## 2015-05-13 NOTE — ED Provider Notes (Signed)
CSN: [id number]     Arrival date & time 05/13/15  1644 History   First MD Initiated Contact with Patient 05/13/15 1840     Chief Complaint  Patient presents with  . Hematuria   HPI Patient presents to the emergency room for hematuria.  The patient has a history of an indwelling Foley catheter for urinary tract obstruction. Henry Curtis has a history of UTIs as well as prostate cancer. Patient started noticing blood from his urethra and penis today. They then started noticing bloody urine in the catheter bag. The last couple of hours there as been decreased urine output. Denies any trouble with fevers or chills. He denies any recent trauma to the catheter. No vomiting or diarrhea. Past Medical History  Diagnosis Date  . Prostate cancer ([place])   . Hypertension   . Myocardial infarction ([place])   . Coronary artery disease involving native coronary artery 12/1989    Unstable Angina  . S/P CABG x 3 12/27/1989    LIMA-D1-LAD, SVG-rPDA (PDA Endarterectomy & patch andioplasty)   . High cholesterol   . Clostridium difficile infection     2015  . Memory difficulty   . Hearing difficulty   . Urinary (tract) obstruction   . Prostate cancer ([place])   . UTI (urinary tract infection)   . Toenail fungus    Past Surgical History  Procedure Laterality Date  . Appendectomy    . Multiple tooth extractions Bilateral   . Coronary artery bypass graft    . Left heart catheterization with coronary/graft angiogram N/A 06/25/2014    Procedure: LEFT HEART CATHETERIZATION WITH Beatrix Fetters;  Surgeon: Leonie Man, MD;  Location[facility] CATH LAB;  Service: Cardiovascular;  Laterality: N/A;  . Cystoscopy with urethral dilatation N/A 12/21/2014    Procedure: CYSTOSCOPY WITH RETROGRADE AND URETHRAL DILATATION ;  Surgeon: Alexis Frock, MD;  Location: [facility] ORS;  Service: Urology;  Laterality: N/A;   Family History  Problem Relation Age of Onset  . Diabetes Father   . Deep vein thrombosis Father   . Heart attack  Brother   . Neuropathy Sister    Social History  Substance Use Topics  . Smoking status: Former Research scientist (life sciences)  . Smokeless tobacco: None  . Alcohol Use: No    Review of Systems  All other systems reviewed and are negative.     Allergies  Review of patient's allergies indicates no known allergies.  Home Medications   Prior to Admission medications   Medication Sig Start Date End Date Taking? Authorizing Provider  aspirin 81 MG tablet Take 81 mg by mouth daily.   Yes Historical Provider, MD  atorvastatin (LIPITOR) 40 MG tablet Take 40 mg by mouth daily.   Yes Historical Provider, MD  Cholecalciferol (VITAMIN D-3 PO) Take 1 tablet by mouth daily.   Yes Historical Provider, MD  clopidogrel (PLAVIX) 75 MG tablet Take 1 tablet (75 mg total) by mouth daily with breakfast. 06/29/14  Yes [name], MD  COCONUT OIL PO Take by mouth. Take 2 tablespoon by mouth in the morning   Yes Historical Provider, MD  Cyanocobalamin (VITAMIN B-12 PO) Take 1 tablet by mouth daily.   Yes Historical Provider, MD  donepezil (ARICEPT) 10 MG tablet Take 1 tablet (10 mg total) by mouth at bedtime. 04/25/15  Yes Britt Bottom, MD  feeding supplement, ENSURE COMPLETE, (ENSURE COMPLETE) LIQD Take 237 mLs by mouth daily at 3 pm. 06/29/14  Yes [name], MD  losartan (COZAAR) 100 MG tablet Take  100 mg by mouth daily.   Yes Historical Provider, MD  metoprolol tartrate (LOPRESSOR) 25 MG tablet Take 1/2 tablet by mouth twice a day   Yes Historical Provider, MD  Omega-3 Fatty Acids (FISH OIL PO) Take 1 capsule by mouth daily.   Yes Historical Provider, MD  Probiotic Product (PROBIOTIC & ACIDOPHILUS EX ST PO) Take 1 tablet by mouth 2 (two) times daily.    Yes Historical Provider, MD  tamsulosin (FLOMAX) 0.4 MG CAPS capsule Take 0.4 mg by mouth at bedtime.   Yes Historical Provider, MD  aspirin EC 325 MG EC tablet Take 1 tablet (325 mg total) by mouth daily. Patient not taking: Reported on 04/25/2015 06/29/14   Nishant  Dhungel, MD   BP 154/81 mmHg  Pulse 68  Temp(Src) 98.1 F (36.7 C) (Oral)  Resp 18  SpO2 99% Physical Exam  Constitutional: No distress.  HENT:  Head: Normocephalic and atraumatic.  Right Ear: External ear normal.  Left Ear: External ear normal.  Eyes: Conjunctivae are normal. Right eye exhibits no discharge. Left eye exhibits no discharge. No scleral icterus.  Neck: Neck supple. No tracheal deviation present.  Cardiovascular: Normal rate, regular rhythm and intact distal pulses.   Pulmonary/Chest: Effort normal and breath sounds normal. No stridor. No respiratory distress. He has no wheezes. He has no rales.  Abdominal: Soft. Bowel sounds are normal. He exhibits no distension. There is no tenderness. There is no rebound and no guarding.  Genitourinary:  Small amount of blood around the urinary catheter,  dark-colored urine in the Foley bag  Musculoskeletal: He exhibits no tenderness.  Neurological: He is alert. No cranial nerve deficit (no facial droop, extraocular movements intact, no slurred speech) or sensory deficit. He exhibits normal muscle tone. He displays no seizure activity. Coordination normal.  Skin: Skin is warm and dry. No rash noted.  Psychiatric: He has a normal mood and affect.  Nursing note and vitals reviewed.   ED Course  Procedures (including critical care time) Labs Review Labs Reviewed  URINALYSIS, ROUTINE W REFLEX MICROSCOPIC (NOT AT [facility]) - Abnormal; Notable for the following:    Color, Urine RED (*)    APPearance TURBID (*)    Hgb urine dipstick LARGE (*)    Ketones, ur 40 (*)    Protein, ur >300 (*)    Leukocytes, UA SMALL (*)    All other components within normal limits  CBC WITH DIFFERENTIAL/PLATELET - Abnormal; Notable for the following:    RBC 4.14 (*)    HCT 37.8 (*)    All other components within normal limits  BASIC METABOLIC PANEL - Abnormal; Notable for the following:    Glucose, Bld 104 (*)    BUN 31 (*)    Creatinine, Ser 1.30 (*)     GFR calc non Af Amer 47 (*)    GFR calc Af Amer 54 (*)    All other components within normal limits  URINE CULTURE  PROTIME-INR  URINE MICROSCOPIC-ADD ON     MDM   Final diagnoses:  Hematuria  Obstructed Foley catheter, initial encounter ([place])    The patient's labs show a normal hemoglobin and hematocrit. He has mild renal insufficiency which is stable.  Urine is consistent with the gross hematuria observed.  Patient had his catheter replaced and irrigated. Patient continues to have bloody urine. He does not appear to be obstructed. I doubt acute infection. Plan on discharge home with outpatient urology follow-up.    Dorie Rank, MD 05/13/15 [phone]

## 2015-05-13 NOTE — ED Notes (Signed)
Pt states that he has a foley catheter. Noticed blood in his urine this morning.  No other sx.

## 2015-05-13 NOTE — ED Notes (Addendum)
Indwellling foley was removed and new foley cath placed.  Upon discontinuation of foley, tube was noted to have clot blocking flow.  New foley placed and catheter drained approx 355ml dark red urine.  Bladder was irrigated with 355ml sterile fluid until irrigant drained clear.  Patient was connected to drainage bag and urine will be collected after initial collection is wasted. MD notified.

## 2015-05-13 NOTE — Discharge Instructions (Signed)

## 2015-05-16 LAB — URINE CULTURE

## 2015-05-17 ENCOUNTER — Telehealth (HOSPITAL_COMMUNITY): Payer: Self-pay

## 2015-05-17 NOTE — Telephone Encounter (Signed)
Post ED Visit - Positive Culture Follow-up  Culture report reviewed by antimicrobial stewardship pharmacist:  []  Heide Guile, Pharm.D., BCPS [x]  Alycia Rossetti, Pharm.D., BCPS []  Pryor, Pharm.D., BCPS, AAHIVP []  Legrand Como, Pharm.D., BCPS, AAHIVP []  Cristy Reyes, Pharm.D. []  Cassie [place], [place].D.  Positive urine culture  no further patient follow-up is required at this time pr Will Dansie PA.  Ileene Musa 05/17/2015, 11:52 AM

## 2015-06-03 ENCOUNTER — Ambulatory Visit (HOSPITAL_COMMUNITY): Payer: Medicare Other

## 2015-06-03 ENCOUNTER — Encounter (HOSPITAL_COMMUNITY): Payer: Medicare Other

## 2015-06-03 ENCOUNTER — Encounter (HOSPITAL_COMMUNITY): Payer: Self-pay | Admitting: Emergency Medicine

## 2015-06-03 ENCOUNTER — Emergency Department (HOSPITAL_COMMUNITY)
Admission: EM | Admit: 2015-06-03 | Discharge: 2015-06-03 | Disposition: A | Discharge status: Home / Self Care | Payer: Medicare Other | Attending: Emergency Medicine | Admitting: Emergency Medicine

## 2015-06-03 DIAGNOSIS — Z8546 Personal history of malignant neoplasm of prostate: Secondary | ICD-10-CM | POA: Insufficient documentation

## 2015-06-03 DIAGNOSIS — Z8619 Personal history of other infectious and parasitic diseases: Secondary | ICD-10-CM | POA: Diagnosis not present

## 2015-06-03 DIAGNOSIS — I10 Essential (primary) hypertension: Secondary | ICD-10-CM | POA: Insufficient documentation

## 2015-06-03 DIAGNOSIS — R1030 Lower abdominal pain, unspecified: Secondary | ICD-10-CM | POA: Diagnosis present

## 2015-06-03 DIAGNOSIS — Z7902 Long term (current) use of antithrombotics/antiplatelets: Secondary | ICD-10-CM | POA: Insufficient documentation

## 2015-06-03 DIAGNOSIS — Z951 Presence of aortocoronary bypass graft: Secondary | ICD-10-CM | POA: Insufficient documentation

## 2015-06-03 DIAGNOSIS — I2510 Atherosclerotic heart disease of native coronary artery without angina pectoris: Secondary | ICD-10-CM | POA: Insufficient documentation

## 2015-06-03 DIAGNOSIS — Z87891 Personal history of nicotine dependence: Secondary | ICD-10-CM | POA: Diagnosis not present

## 2015-06-03 DIAGNOSIS — I252 Old myocardial infarction: Secondary | ICD-10-CM | POA: Insufficient documentation

## 2015-06-03 DIAGNOSIS — R140 Abdominal distension (gaseous): Secondary | ICD-10-CM | POA: Insufficient documentation

## 2015-06-03 DIAGNOSIS — E7800 Pure hypercholesterolemia, unspecified: Secondary | ICD-10-CM | POA: Diagnosis not present

## 2015-06-03 DIAGNOSIS — Z7982 Long term (current) use of aspirin: Secondary | ICD-10-CM | POA: Diagnosis not present

## 2015-06-03 DIAGNOSIS — R339 Retention of urine, unspecified: Secondary | ICD-10-CM

## 2015-06-03 DIAGNOSIS — H9190 Unspecified hearing loss, unspecified ear: Secondary | ICD-10-CM | POA: Diagnosis not present

## 2015-06-03 DIAGNOSIS — Z87440 Personal history of urinary (tract) infections: Secondary | ICD-10-CM | POA: Diagnosis not present

## 2015-06-03 DIAGNOSIS — Z79899 Other long term (current) drug therapy: Secondary | ICD-10-CM | POA: Insufficient documentation

## 2015-06-03 DIAGNOSIS — Z9889 Other specified postprocedural states: Secondary | ICD-10-CM | POA: Insufficient documentation

## 2015-06-03 LAB — COMPREHENSIVE METABOLIC PANEL
ALK PHOS: 64 U/L (ref 38–126)
ALT: 13 U/L — AB (ref 17–63)
AST: 21 U/L (ref 15–41)
Albumin: 3.9 g/dL (ref 3.5–5.0)
Anion gap: 7 (ref 5–15)
BILIRUBIN TOTAL: 0.9 mg/dL (ref 0.3–1.2)
BUN: 20 mg/dL (ref 6–20)
CALCIUM: 9.3 mg/dL (ref 8.9–10.3)
CO2: 24 mmol/L (ref 22–32)
CREATININE: 1.1 mg/dL (ref 0.61–1.24)
Chloride: 108 mmol/L (ref 101–111)
GFR calc Af Amer: >60 mL/min (ref >60)
GFR, EST NON AFRICAN AMERICAN: 57 mL/min — AB (ref >60)
GLUCOSE: 119 mg/dL — AB (ref 65–99)
Potassium: 4.1 mmol/L (ref 3.5–5.1)
Sodium: 139 mmol/L (ref 135–145)
TOTAL PROTEIN: 7 g/dL (ref 6.5–8.1)

## 2015-06-03 LAB — URINALYSIS, ROUTINE W REFLEX MICROSCOPIC
Bilirubin Urine: NEGATIVE
GLUCOSE, UA: NEGATIVE mg/dL
KETONES UR: NEGATIVE mg/dL
Nitrite: POSITIVE — AB
PH: 7 (ref 5.0–8.0)
PROTEIN: 30 mg/dL — AB
Specific Gravity, Urine: 1.022 (ref 1.005–1.030)
Urobilinogen, UA: 1 mg/dL (ref 0.0–1.0)

## 2015-06-03 LAB — CBC WITH DIFFERENTIAL/PLATELET
BASOS ABS: 0 10*3/uL (ref 0.0–0.1)
Basophils Relative: 0 %
Eosinophils Absolute: 0.3 10*3/uL (ref 0.0–0.7)
Eosinophils Relative: 3 %
HEMATOCRIT: 38 % — AB (ref 39.0–52.0)
HEMOGLOBIN: 13.1 g/dL (ref 13.0–17.0)
LYMPHS PCT: 22 %
Lymphs Abs: 1.7 10*3/uL (ref 0.7–4.0)
MCH: 31.6 pg (ref 26.0–34.0)
MCHC: 34.5 g/dL (ref 30.0–36.0)
MCV: 91.8 fL (ref 78.0–100.0)
MONO ABS: 1.1 10*3/uL — AB (ref 0.1–1.0)
Monocytes Relative: 15 %
NEUTROS ABS: 4.4 10*3/uL (ref 1.7–7.7)
NEUTROS PCT: 60 %
Platelets: 162 10*3/uL (ref 150–400)
RBC: 4.14 MIL/uL — AB (ref 4.22–5.81)
RDW: 14.2 % (ref 11.5–15.5)
WBC: 7.5 10*3/uL (ref 4.0–10.5)

## 2015-06-03 LAB — URINE MICROSCOPIC-ADD ON

## 2015-06-03 NOTE — ED Provider Notes (Signed)
CSN: [id number]     Arrival date & time 06/03/15  1026 History   First MD Initiated Contact with Patient 06/03/15 1027     Chief Complaint  Patient presents with  . Abdominal Pain     (Consider location/radiation/quality/duration/timing/severity/associated sxs/prior Treatment) Patient is a 81 y.o. male presenting with abdominal pain.  Abdominal Pain Pain location:  Suprapubic Pain quality: aching   Pain radiates to:  Does not radiate Pain severity:  Moderate Onset quality:  Gradual Duration:  3 days Timing:  Constant Progression:  Worsening Chronicity:  Recurrent Context comment:  Similar to prior episode of acute retention Relieved by:  Nothing Worsened by:  Nothing tried Associated symptoms: no anorexia, no cough, no diarrhea, no nausea and no vomiting     Past Medical History  Diagnosis Date  . Prostate cancer ([place])   . Hypertension   . Myocardial infarction ([place])   . Coronary artery disease involving native coronary artery 12/1989    Unstable Angina  . S/P CABG x 3 12/27/1989    LIMA-D1-LAD, SVG-rPDA (PDA Endarterectomy & patch andioplasty)   . High cholesterol   . Clostridium difficile infection     2015  . Memory difficulty   . Hearing difficulty   . Urinary (tract) obstruction   . Prostate cancer ([place])   . UTI (urinary tract infection)   . Toenail fungus    Past Surgical History  Procedure Laterality Date  . Appendectomy    . Multiple tooth extractions Bilateral   . Coronary artery bypass graft    . Left heart catheterization with coronary/graft angiogram N/A 06/25/2014    Procedure: LEFT HEART CATHETERIZATION WITH Beatrix Fetters;  Surgeon: Leonie Man, MD;  Location: [facility] CATH LAB;  Service: Cardiovascular;  Laterality: N/A;  . Cystoscopy with urethral dilatation N/A 12/21/2014    Procedure: CYSTOSCOPY WITH RETROGRADE AND URETHRAL DILATATION ;  Surgeon: Alexis Frock, MD;  Location: [facility] ORS;  Service: Urology;  Laterality: N/A;   Family History   Problem Relation Age of Onset  . Diabetes Father   . Deep vein thrombosis Father   . Heart attack Brother   . Neuropathy Sister    Social History  Substance Use Topics  . Smoking status: Former Research scientist (life sciences)  . Smokeless tobacco: None  . Alcohol Use: No    Review of Systems  Respiratory: Negative for cough.   Gastrointestinal: Positive for abdominal pain. Negative for nausea, vomiting, diarrhea and anorexia.  All other systems reviewed and are negative.     Allergies  Review of patient's allergies indicates no known allergies.  Home Medications   Prior to Admission medications   Medication Sig Start Date End Date Taking? Authorizing Provider  aspirin 81 MG tablet Take 81 mg by mouth daily.   Yes Historical Provider, MD  atorvastatin (LIPITOR) 40 MG tablet Take 40 mg by mouth daily.   Yes Historical Provider, MD  Cholecalciferol (VITAMIN D-3 PO) Take 1 tablet by mouth daily.   Yes Historical Provider, MD  clopidogrel (PLAVIX) 75 MG tablet Take 1 tablet (75 mg total) by mouth daily with breakfast. 06/29/14  Yes [name], MD  COCONUT OIL PO Take by mouth. Take 2 tablespoon by mouth in the morning   Yes Historical Provider, MD  Cyanocobalamin (VITAMIN B-12 PO) Take 1 tablet by mouth daily.   Yes Historical Provider, MD  donepezil (ARICEPT) 10 MG tablet Take 1 tablet (10 mg total) by mouth at bedtime. 04/25/15  Yes Britt Bottom, MD  feeding supplement,  ENSURE COMPLETE, (ENSURE COMPLETE) LIQD Take 237 mLs by mouth daily at 3 pm. Patient taking differently: Take 237 mLs by mouth daily as needed (nutrition).  06/29/14  Yes [name], MD  finasteride (PROSCAR) 5 MG tablet Take 5 mg by mouth daily.   Yes Historical Provider, MD  losartan (COZAAR) 100 MG tablet Take 100 mg by mouth daily.   Yes Historical Provider, MD  metoprolol tartrate (LOPRESSOR) 25 MG tablet Take 1/2 tablet by mouth twice a day   Yes Historical Provider, MD  Omega-3 Fatty Acids (FISH OIL PO) Take 1 capsule  by mouth daily.   Yes Historical Provider, MD  Probiotic Product (PROBIOTIC & ACIDOPHILUS EX ST PO) Take 1 tablet by mouth 2 (two) times daily.    Yes Historical Provider, MD  aspirin EC 325 MG EC tablet Take 1 tablet (325 mg total) by mouth daily. Patient not taking: Reported on 04/25/2015 06/29/14   [name], MD   BP 125/59 mmHg  Pulse 52  Temp(Src) 97.7 F (36.5 C) (Oral)  Resp 14  Ht 5\' 9"  (1.753 m)  Wt 170 lb (77.111 kg)  BMI 25.09 kg/m2  SpO2 99% Physical Exam  Constitutional: He is oriented to person, place, and time. He appears well-developed and well-nourished.  HENT:  Head: Normocephalic and atraumatic.  Eyes: Conjunctivae and EOM are normal.  Neck: Normal range of motion. Neck supple.  Cardiovascular: Normal rate, regular rhythm and normal heart sounds.   Pulmonary/Chest: Effort normal and breath sounds normal. No respiratory distress.  Abdominal: He exhibits distension (suprapubic). There is tenderness in the suprapubic area. There is no rebound and no guarding.  Catheter with very little urine in collectnio bag  Musculoskeletal: Normal range of motion.  Neurological: He is alert and oriented to person, place, and time.  Skin: Skin is warm and dry.  Vitals reviewed.   ED Course  Procedures (including critical care time) Labs Review Labs Reviewed  CBC WITH DIFFERENTIAL/PLATELET - Abnormal; Notable for the following:    RBC 4.14 (*)    HCT 38.0 (*)    Monocytes Absolute 1.1 (*)    All other components within normal limits  COMPREHENSIVE METABOLIC PANEL - Abnormal; Notable for the following:    Glucose, Bld 119 (*)    ALT 13 (*)    GFR calc non Af Amer 57 (*)    All other components within normal limits  URINALYSIS, ROUTINE W REFLEX MICROSCOPIC (NOT AT [facility]) - Abnormal; Notable for the following:    APPearance TURBID (*)    Hgb urine dipstick MODERATE (*)    Protein, ur 30 (*)    Nitrite POSITIVE (*)    Leukocytes, UA LARGE (*)    All other components  within normal limits  URINE MICROSCOPIC-ADD ON - Abnormal; Notable for the following:    Bacteria, UA MANY (*)    All other components within normal limits    Imaging Review No results found. I have personally reviewed and evaluated these images and lab results as part of my medical decision-making.   EKG Interpretation None      MDM   Final diagnoses:  Urinary retention    81 y.o. male with pertinent PMH of prostate ca, urinary obstruction with indwelling foley presents with recurrent suprapubic distension and pain.  Physical exam as above.  470 cc urine despite foley via bladder scan.  Foley replaced with 450 output.  UA with signs of potential infection, however no fever, symptoms completely relieved after foley replacement.  DIscussed results with family and with shared decision making decided to defer abx at this time and have family discuss with urology.  DC home in stable condition  I have reviewed all laboratory and imaging studies if ordered as above  1. Urinary retention         Debby Freiberg, MD 06/05/15 0900

## 2015-06-03 NOTE — ED Notes (Signed)
MD at bedside. 

## 2015-06-03 NOTE — ED Notes (Signed)
Bed: WA09 Expected date:  Expected time:  Means of arrival:  Comments: 81 y/o M abd pain

## 2015-06-03 NOTE — ED Notes (Signed)
Pt BIB EMS c/o abdominal pain that began 2-3 days ago; pt has catheter in place states he had pain in his urethra last night; per pt he is unable to produce urine this morning; LBM yesterday; pt reports dark urine color and distended lower abdomen. NAD noted. Pt calm and cooperative and reports 0/10 pain.

## 2015-06-03 NOTE — Discharge Instructions (Signed)
Acute Urinary Retention, Male °Acute urinary retention is the temporary inability to urinate. °This is a common problem in older men. As men age their prostates become larger and block the flow of urine from the bladder. This is usually a problem that has come on gradually.  °HOME CARE INSTRUCTIONS °If you are sent home with a Foley catheter and a drainage system, you will need to discuss the best course of action with your health care provider. While the catheter is in, maintain a good intake of fluids. Keep the drainage bag emptied and lower than your catheter. This is so that contaminated urine will not flow back into your bladder, which could lead to a urinary tract infection. °There are two main types of drainage bags. One is a large bag that usually is used at night. It has a good capacity that will allow you to sleep through the night without having to empty it. The second type is called a leg bag. It has a smaller capacity, so it needs to be emptied more frequently. However, the main advantage is that it can be attached by a leg strap and can go underneath your clothing, allowing you the freedom to move about or leave your home. °Only take over-the-counter or prescription medicines for pain, discomfort, or fever as directed by your health care provider.  °SEEK MEDICAL CARE IF: °· You develop a low-grade fever. °· You experience spasms or leakage of urine with the spasms. °SEEK IMMEDIATE MEDICAL CARE IF:  °· You develop chills or fever. °· Your catheter stops draining urine. °· Your catheter falls out. °· You start to develop increased bleeding that does not respond to rest and increased fluid intake. °MAKE SURE YOU: °· Understand these instructions. °· Will watch your condition. °· Will get help right away if you are not doing well or get worse. °  °This information is not intended to replace advice given to you by your health care provider. Make sure you discuss any questions you have with your health care  provider. °  °Document Released: 10/15/2000 Document Revised: 11/23/2014 Document Reviewed: 12/18/2012 °Elsevier Interactive Patient Education ©2016 [employer] Inc[employer] ° °

## 2015-06-14 NOTE — Progress Notes (Signed)
HPI: FU CAD. Patient has had previous coronary artery bypass and graft in 1991 with a LIMA to the diagonal and LAD, saphenous vein graft to the PDA. Echocardiogram December 2015 showed an ejection fraction of 45-50% and moderate mitral regurgitation. Cardiac catheterization December 2015 showed an occluded LAD. There was a 95% lesion in the LAD distal to the LIMA insertion. There was also another 95% lesion distal to that. There are extensive collaterals to the distal LAD. The circumflex was occluded. There were bridging collaterals into the second and third marginal. The right coronary was occluded as was the saphenous vein graft to the right coronary artery. Patient had PCI of the distal LAD with drug-eluting stent and PTCA. Since last seen,   Current Outpatient Prescriptions  Medication Sig Dispense Refill  . aspirin 81 MG tablet Take 81 mg by mouth daily.    Marland Kitchen aspirin EC 325 MG EC tablet Take 1 tablet (325 mg total) by mouth daily. (Patient not taking: Reported on 04/25/2015) 30 tablet 0  . atorvastatin (LIPITOR) 40 MG tablet Take 40 mg by mouth daily.    . Cholecalciferol (VITAMIN D-3 PO) Take 1 tablet by mouth daily.    . clopidogrel (PLAVIX) 75 MG tablet Take 1 tablet (75 mg total) by mouth daily with breakfast. 30 tablet 0  . COCONUT OIL PO Take by mouth. Take 2 tablespoon by mouth in the morning    . Cyanocobalamin (VITAMIN B-12 PO) Take 1 tablet by mouth daily.    Marland Kitchen donepezil (ARICEPT) 10 MG tablet Take 1 tablet (10 mg total) by mouth at bedtime. 90 tablet 4  . feeding supplement, ENSURE COMPLETE, (ENSURE COMPLETE) LIQD Take 237 mLs by mouth daily at 3 pm. (Patient taking differently: Take 237 mLs by mouth daily as needed (nutrition). ) 30 Bottle 0  . finasteride (PROSCAR) 5 MG tablet Take 5 mg by mouth daily.    Marland Kitchen losartan (COZAAR) 100 MG tablet Take 100 mg by mouth daily.    . metoprolol tartrate (LOPRESSOR) 25 MG tablet Take 1/2 tablet by mouth twice a day    . Omega-3 Fatty  Acids (FISH OIL PO) Take 1 capsule by mouth daily.    . Probiotic Product (PROBIOTIC & ACIDOPHILUS EX ST PO) Take 1 tablet by mouth 2 (two) times daily.      No current facility-administered medications for this visit.     Past Medical History  Diagnosis Date  . Prostate cancer ([place])   . Hypertension   . Myocardial infarction ([place])   . Coronary artery disease involving native coronary artery 12/1989    Unstable Angina  . S/P CABG x 3 12/27/1989    LIMA-D1-LAD, SVG-rPDA (PDA Endarterectomy & patch andioplasty)   . High cholesterol   . Clostridium difficile infection     2015  . Memory difficulty   . Hearing difficulty   . Urinary (tract) obstruction   . Prostate cancer ([place])   . UTI (urinary tract infection)   . Toenail fungus     Past Surgical History  Procedure Laterality Date  . Appendectomy    . Multiple tooth extractions Bilateral   . Coronary artery bypass graft    . Left heart catheterization with coronary/graft angiogram N/A 06/25/2014    Procedure: LEFT HEART CATHETERIZATION WITH Beatrix Fetters;  Surgeon: Leonie Man, MD;  Location: [facility] CATH LAB;  Service: Cardiovascular;  Laterality: N/A;  . Cystoscopy with urethral dilatation N/A 12/21/2014    Procedure: CYSTOSCOPY WITH RETROGRADE  AND URETHRAL DILATATION ;  Surgeon: Alexis Frock, MD;  Location: [facility] ORS;  Service: Urology;  Laterality: N/A;    Social History   Social History  . Marital Status: Widowed    Spouse Name: N/A  . Number of Children: N/A  . Years of Education: N/A   Occupational History  . Not on file.   Social History Main Topics  . Smoking status: Former Research scientist (life sciences)  . Smokeless tobacco: Not on file  . Alcohol Use: No  . Drug Use: No  . Sexual Activity: Not on file   Other Topics Concern  . Not on file   Social History Narrative   Diet heart healthy - sweets in moderation       Do you drink/ eat things with caffeine? No       Marital status: Widowed Twice                               What year were you married ?      Do you live in a house, apartment,assistred living, condo, trailer, etc.)? Home      Is it one or more stories? 2 stories      How many persons live in your home ? 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  [employer]                     If not, do you want to discuss one?       Do you have signed POA?HPOA forms?  Yes               If so, please bring to your        appointment          ROS: no fevers or chills, productive cough, hemoptysis, dysphasia, odynophagia, melena, hematochezia, dysuria, hematuria, rash, seizure activity, orthopnea, PND, pedal edema, claudication. Remaining systems are negative.  Physical Exam: Well-developed well-nourished in no acute distress.  Skin is warm and dry.  HEENT is normal.  Neck is supple.  Chest is clear to auscultation with normal expansion.  Cardiovascular exam is regular rate and rhythm.  Abdominal exam nontender or distended. No masses palpated. Extremities show no edema. neuro grossly intact  ECG     This encounter was created in error - please disregard.

## 2015-06-21 ENCOUNTER — Encounter: Payer: Medicare Other | Admitting: Cardiology

## 2015-06-24 ENCOUNTER — Encounter: Payer: Self-pay | Admitting: Cardiology

## 2015-06-24 ENCOUNTER — Ambulatory Visit (INDEPENDENT_AMBULATORY_CARE_PROVIDER_SITE_OTHER): Payer: Medicare Other | Admitting: Cardiology

## 2015-06-24 ENCOUNTER — Ambulatory Visit (INDEPENDENT_AMBULATORY_CARE_PROVIDER_SITE_OTHER): Payer: Medicare Other | Admitting: Podiatry

## 2015-06-24 ENCOUNTER — Encounter: Payer: Self-pay | Admitting: Podiatry

## 2015-06-24 VITALS — BP 132/58 | HR 53 | Ht 69.0 in | Wt 176.1 lb

## 2015-06-24 DIAGNOSIS — M79676 Pain in unspecified toe(s): Secondary | ICD-10-CM | POA: Diagnosis not present

## 2015-06-24 DIAGNOSIS — B351 Tinea unguium: Secondary | ICD-10-CM

## 2015-06-24 DIAGNOSIS — I2510 Atherosclerotic heart disease of native coronary artery without angina pectoris: Secondary | ICD-10-CM

## 2015-06-24 DIAGNOSIS — E785 Hyperlipidemia, unspecified: Secondary | ICD-10-CM | POA: Diagnosis not present

## 2015-06-24 DIAGNOSIS — I10 Essential (primary) hypertension: Secondary | ICD-10-CM | POA: Diagnosis not present

## 2015-06-24 NOTE — Assessment & Plan Note (Signed)
Continue statin. 

## 2015-06-24 NOTE — Assessment & Plan Note (Signed)
Plan to continue aspirin and statin. Discontinue Plavix

## 2015-06-24 NOTE — Assessment & Plan Note (Signed)
Blood pressure controlled. Continue present medications. 

## 2015-06-24 NOTE — Progress Notes (Signed)
HPI: follow-up coronary artery disease. Patient has had previous coronary artery bypass and graft in 1991 with a LIMA to the diagonal and LAD, saphenous vein graft to the PDA.Recently admitted with increased confusion and fatigue. Troponins were abnormal. Echocardiogram December 2015 showed an ejection fraction of 45-50% and moderate mitral regurgitation. Cardiac catheterization December 2015 showed an occluded LAD. There was a 95% lesion in the LAD distal to the LIMA insertion. There was also another 95% lesion distal to that. There are extensive collaterals to the distal LAD. The circumflex was occluded. There were bridging collaterals into the second and third marginal. The right coronary was occluded as was the saphenous vein graft to the right coronary artery. Patient had PCI of the distal LAD with drug-eluting stent and PTCA. Treated for Clostridium difficile colitis. Since last seen, He denies dyspnea, chest pain, palpitations or syncope.  Current Outpatient Prescriptions  Medication Sig Dispense Refill  . aspirin 81 MG tablet Take 81 mg by mouth daily.    Marland Kitchen atorvastatin (LIPITOR) 40 MG tablet Take 40 mg by mouth daily.    . Cholecalciferol (VITAMIN D-3 PO) Take 1 tablet by mouth daily.    . clopidogrel (PLAVIX) 75 MG tablet Take 1 tablet (75 mg total) by mouth daily with breakfast. 30 tablet 0  . COCONUT OIL PO Take by mouth. Take 2 tablespoon by mouth in the morning    . Cyanocobalamin (VITAMIN B-12 PO) Take 1 tablet by mouth daily.    . feeding supplement, ENSURE COMPLETE, (ENSURE COMPLETE) LIQD Take 237 mLs by mouth daily at 3 pm. (Patient taking differently: Take 237 mLs by mouth daily as needed (nutrition). ) 30 Bottle 0  . finasteride (PROSCAR) 5 MG tablet Take 5 mg by mouth daily.    Marland Kitchen losartan (COZAAR) 100 MG tablet Take 100 mg by mouth daily.    . Melatonin 5 MG TABS Take 5 mg by mouth at bedtime.    . metoprolol tartrate (LOPRESSOR) 25 MG tablet Take 1/2 tablet by mouth  twice a day    . Omega-3 Fatty Acids (FISH OIL PO) Take 1 capsule by mouth daily.    . Probiotic Product (PROBIOTIC & ACIDOPHILUS EX ST PO) Take 1 tablet by mouth 2 (two) times daily.      No current facility-administered medications for this visit.     Past Medical History  Diagnosis Date  . Prostate cancer ([place])   . Hypertension   . Myocardial infarction ([place])   . Coronary artery disease involving native coronary artery 12/1989    Unstable Angina  . S/P CABG x 3 12/27/1989    LIMA-D1-LAD, SVG-rPDA (PDA Endarterectomy & patch andioplasty)   . High cholesterol   . Clostridium difficile infection     2015  . Memory difficulty   . Hearing difficulty   . Urinary (tract) obstruction   . Prostate cancer ([place])   . UTI (urinary tract infection)   . Toenail fungus     Past Surgical History  Procedure Laterality Date  . Appendectomy    . Multiple tooth extractions Bilateral   . Coronary artery bypass graft    . Left heart catheterization with coronary/graft angiogram N/A 06/25/2014    Procedure: LEFT HEART CATHETERIZATION WITH Beatrix Fetters;  Surgeon: Leonie Man, MD;  Location: [facility] CATH LAB;  Service: Cardiovascular;  Laterality: N/A;  . Cystoscopy with urethral dilatation N/A 12/21/2014    Procedure: CYSTOSCOPY WITH RETROGRADE AND URETHRAL DILATATION ;  Surgeon: Alexis Frock,  MD;  Location: [facility] ORS;  Service: Urology;  Laterality: N/A;    Social History   Social History  . Marital Status: Widowed    Spouse Name: N/A  . Number of Children: N/A  . Years of Education: N/A   Occupational History  . Not on file.   Social History Main Topics  . Smoking status: Former Research scientist (life sciences)  . Smokeless tobacco: Not on file  . Alcohol Use: No  . Drug Use: No  . Sexual Activity: Not on file   Other Topics Concern  . Not on file   Social History Narrative   Diet heart healthy - sweets in moderation       Do you drink/ eat things with caffeine? No       Marital status:  Widowed Twice                              What year were you married ?      Do you live in a house, apartment,assistred living, condo, trailer, etc.)? Home      Is it one or more stories? 2 stories      How many persons live in your home ? 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  [employer]                     If not, do you want to discuss one?       Do you have signed POA?HPOA forms?  Yes               If so, please bring to your        appointment          ROS: no fevers or chills, productive cough, hemoptysis, dysphasia, odynophagia, melena, hematochezia, dysuria, hematuria, rash, seizure activity, orthopnea, PND, pedal edema, claudication. Remaining systems are negative.  Physical Exam: Well-developed well-nourished in no acute distress.  Skin is warm and dry.  HEENT is normal.  Neck is supple.  Chest is clear to auscultation with normal expansion.  Cardiovascular exam is regular rate and rhythm. 2/6 systolic murmur left sternal border. Abdominal exam nontender or distended. No masses palpated. Extremities show no edema. neuro grossly intact  ECG Sinus rhythm with atrial bigeminy. Nonspecific T-wave changes. Inferior infarct.

## 2015-06-24 NOTE — Patient Instructions (Signed)
Medication Instructions:   STOP PLAVIX  Follow-Up:  Your physician wants you to follow-up in: ONE YEAR WITH DR CRENSHAW You will receive a reminder letter in the mail two months in advance. If you don't receive a letter, please call our office to schedule the follow-up appointment.   If you need a refill on your cardiac medications before your next appointment, please call your pharmacy.    

## 2015-06-27 NOTE — Progress Notes (Signed)
Patient ID: Henry Curtis, male   DOB: 04/20/1922, 81 y.o.   MRN: [id number]  Subjective: 81 y.o. returns the office today for painful, elongated, thickened toenails which he is unable to trim himself. Denies any redness or drainage around the nails. Denies any acute changes since last appointment and no new complaints today. Denies any systemic complaints such as fevers, chills, nausea, vomiting.   Objective: AAO 3, NAD DP/PT pulses palpable 1/4, CRT less than 3 seconds Nails hypertrophic, dystrophic, elongated, brittle, discolored 10. There is tenderness overlying the nails 1-5 bilaterally. There is no surrounding erythema or drainage along the nail sites. No open lesions or pre-ulcerative lesions are identified. No other areas of tenderness bilateral lower extremities. No overlying edema, erythema, increased warmth. No pain with calf compression, swelling, warmth, erythema.  Assessment: Patient presents with symptomatic onychomycosis  Plan: -Treatment options including alternatives, risks, complications were discussed -Nails sharply debrided 10 without complication/bleeding. -Discussed daily foot inspection. If there are any changes, to call the office immediately.  -Follow-up in 3 months or sooner if any problems are to arise. In the meantime, encouraged to call the office with any questions, concerns, changes symptoms.  Matthew Wagoner, DPM  

## 2015-07-07 ENCOUNTER — Ambulatory Visit (HOSPITAL_COMMUNITY)
Admission: RE | Admit: 2015-07-07 | Discharge: 2015-07-07 | Disposition: A | Discharge status: Home / Self Care | Payer: Medicare Other | Source: Ambulatory Visit | Attending: Urology | Admitting: Urology

## 2015-07-07 ENCOUNTER — Encounter (HOSPITAL_COMMUNITY)
Admission: RE | Admit: 2015-07-07 | Discharge: 2015-07-07 | Disposition: A | Discharge status: Home / Self Care | Payer: Medicare Other | Source: Ambulatory Visit | Attending: Urology | Admitting: Urology

## 2015-07-07 ENCOUNTER — Encounter (HOSPITAL_COMMUNITY): Payer: Self-pay

## 2015-07-07 DIAGNOSIS — C61 Malignant neoplasm of prostate: Secondary | ICD-10-CM

## 2015-07-07 MED ORDER — TECHNETIUM TC 99M MEDRONATE IV KIT
25.0000 | PACK | Freq: Once | INTRAVENOUS | Status: AC | PRN
  Administered 2015-07-07: 25 via INTRAVENOUS

## 2015-08-08 ENCOUNTER — Encounter (HOSPITAL_COMMUNITY): Payer: Self-pay | Admitting: Emergency Medicine

## 2015-08-08 ENCOUNTER — Emergency Department (HOSPITAL_COMMUNITY)
Admission: EM | Admit: 2015-08-08 | Discharge: 2015-08-08 | Disposition: A | Discharge status: Home / Self Care | Payer: Medicare Other | Attending: Emergency Medicine | Admitting: Emergency Medicine

## 2015-08-08 DIAGNOSIS — Z8546 Personal history of malignant neoplasm of prostate: Secondary | ICD-10-CM | POA: Insufficient documentation

## 2015-08-08 DIAGNOSIS — I10 Essential (primary) hypertension: Secondary | ICD-10-CM | POA: Diagnosis not present

## 2015-08-08 DIAGNOSIS — H9190 Unspecified hearing loss, unspecified ear: Secondary | ICD-10-CM | POA: Diagnosis not present

## 2015-08-08 DIAGNOSIS — Z951 Presence of aortocoronary bypass graft: Secondary | ICD-10-CM | POA: Diagnosis not present

## 2015-08-08 DIAGNOSIS — T83098A Other mechanical complication of other indwelling urethral catheter, initial encounter: Secondary | ICD-10-CM | POA: Insufficient documentation

## 2015-08-08 DIAGNOSIS — E7800 Pure hypercholesterolemia, unspecified: Secondary | ICD-10-CM | POA: Diagnosis not present

## 2015-08-08 DIAGNOSIS — Z79899 Other long term (current) drug therapy: Secondary | ICD-10-CM | POA: Diagnosis not present

## 2015-08-08 DIAGNOSIS — Z7982 Long term (current) use of aspirin: Secondary | ICD-10-CM | POA: Diagnosis not present

## 2015-08-08 DIAGNOSIS — Z87440 Personal history of urinary (tract) infections: Secondary | ICD-10-CM | POA: Diagnosis not present

## 2015-08-08 DIAGNOSIS — I252 Old myocardial infarction: Secondary | ICD-10-CM | POA: Insufficient documentation

## 2015-08-08 DIAGNOSIS — R339 Retention of urine, unspecified: Secondary | ICD-10-CM

## 2015-08-08 DIAGNOSIS — Y732 Prosthetic and other implants, materials and accessory gastroenterology and urology devices associated with adverse incidents: Secondary | ICD-10-CM | POA: Insufficient documentation

## 2015-08-08 DIAGNOSIS — Z9889 Other specified postprocedural states: Secondary | ICD-10-CM | POA: Insufficient documentation

## 2015-08-08 DIAGNOSIS — Z8619 Personal history of other infectious and parasitic diseases: Secondary | ICD-10-CM | POA: Insufficient documentation

## 2015-08-08 DIAGNOSIS — Z87891 Personal history of nicotine dependence: Secondary | ICD-10-CM | POA: Insufficient documentation

## 2015-08-08 DIAGNOSIS — I25110 Atherosclerotic heart disease of native coronary artery with unstable angina pectoris: Secondary | ICD-10-CM | POA: Insufficient documentation

## 2015-08-08 DIAGNOSIS — T83091A Other mechanical complication of indwelling urethral catheter, initial encounter: Secondary | ICD-10-CM

## 2015-08-08 LAB — I-STAT CHEM 8, ED
BUN: 25 mg/dL — ABNORMAL HIGH (ref 6–20)
CHLORIDE: 106 mmol/L (ref 101–111)
CREATININE: 1.1 mg/dL (ref 0.61–1.24)
Calcium, Ion: 1.24 mmol/L (ref 1.13–1.30)
GLUCOSE: 123 mg/dL — AB (ref 65–99)
HCT: 35 % — ABNORMAL LOW (ref 39.0–52.0)
HEMOGLOBIN: 11.9 g/dL — AB (ref 13.0–17.0)
POTASSIUM: 3.9 mmol/L (ref 3.5–5.1)
Sodium: 143 mmol/L (ref 135–145)
TCO2: 24 mmol/L (ref 0–100)

## 2015-08-08 NOTE — ED Notes (Signed)
Pt has a foley catheter and states it is not draining  Family states she attempted to flush it at home and it would not flush  Pt is c/o pressure in his bladder area

## 2015-08-08 NOTE — ED Provider Notes (Signed)
CSN: [id number]     Arrival date & time 08/08/15  2058 History   First MD Initiated Contact with Patient 08/08/15 2144     Chief Complaint  Patient presents with  . Urinary Retention     (Consider location/radiation/quality/duration/timing/severity/associated sxs/prior Treatment) HPI  82 year old male with a history of prostate cancer and chronic urinary obstruction requiring chronic Foley placement presents with recurrent Foley catheter blockage. At some point today his urine output has stopped. He has developed lower abdominal swelling and tenderness. Before this he was feeling well and had no complete set of penile pain, abdominal pain, vomiting, or back pain. Family tried to flush it at home with no relief. This is a recurrent issue. He has had a Foley for several years.  Past Medical History  Diagnosis Date  . Prostate cancer ([place])   . Hypertension   . Myocardial infarction ([place])   . Coronary artery disease involving native coronary artery 12/1989    Unstable Angina  . S/P CABG x 3 12/27/1989    LIMA-D1-LAD, SVG-rPDA (PDA Endarterectomy & patch andioplasty)   . High cholesterol   . Clostridium difficile infection     2015  . Memory difficulty   . Hearing difficulty   . Urinary (tract) obstruction   . Prostate cancer ([place])   . UTI (urinary tract infection)   . Toenail fungus    Past Surgical History  Procedure Laterality Date  . Appendectomy    . Multiple tooth extractions Bilateral   . Coronary artery bypass graft    . Left heart catheterization with coronary/graft angiogram N/A 06/25/2014    Procedure: LEFT HEART CATHETERIZATION WITH Beatrix Fetters;  Surgeon: Leonie Man, MD;  Location: [facility] CATH LAB;  Service: Cardiovascular;  Laterality: N/A;  . Cystoscopy with urethral dilatation N/A 12/21/2014    Procedure: CYSTOSCOPY WITH RETROGRADE AND URETHRAL DILATATION ;  Surgeon: Alexis Frock, MD;  Location: [facility] ORS;  Service: Urology;  Laterality: N/A;   Family  History  Problem Relation Age of Onset  . Diabetes Father   . Deep vein thrombosis Father   . Heart attack Brother   . Neuropathy Sister    Social History  Substance Use Topics  . Smoking status: Former Research scientist (life sciences)  . Smokeless tobacco: None  . Alcohol Use: No    Review of Systems  Constitutional: Negative for fever.  Gastrointestinal: Positive for abdominal pain and abdominal distention. Negative for vomiting.  Genitourinary: Positive for decreased urine volume.  All other systems reviewed and are negative.     Allergies  Review of patient's allergies indicates no known allergies.  Home Medications   Prior to Admission medications   Medication Sig Start Date End Date Taking? Authorizing Provider  aspirin 81 MG tablet Take 81 mg by mouth daily.    Historical Provider, MD  atorvastatin (LIPITOR) 40 MG tablet Take 40 mg by mouth daily.    Historical Provider, MD  Cholecalciferol (VITAMIN D-3 PO) Take 1 tablet by mouth daily.    Historical Provider, MD  COCONUT OIL PO Take by mouth. Take 2 tablespoon by mouth in the morning    Historical Provider, MD  Cyanocobalamin (VITAMIN B-12 PO) Take 1 tablet by mouth daily.    Historical Provider, MD  feeding supplement, ENSURE COMPLETE, (ENSURE COMPLETE) LIQD Take 237 mLs by mouth daily at 3 pm. Patient taking differently: Take 237 mLs by mouth daily as needed (nutrition).  06/29/14   [name], MD  finasteride (PROSCAR) 5 MG tablet Take 5 mg  by mouth daily.    Historical Provider, MD  losartan (COZAAR) 100 MG tablet Take 100 mg by mouth daily.    Historical Provider, MD  Melatonin 5 MG TABS Take 5 mg by mouth at bedtime.    Historical Provider, MD  metoprolol tartrate (LOPRESSOR) 25 MG tablet Take 1/2 tablet by mouth twice a day    Historical Provider, MD  Omega-3 Fatty Acids (FISH OIL PO) Take 1 capsule by mouth daily.    Historical Provider, MD  Probiotic Product (PROBIOTIC & ACIDOPHILUS EX ST PO) Take 1 tablet by mouth 2 (two) times  daily.     Historical Provider, MD   BP 128/67 mmHg  Pulse 60  Temp(Src) 98.1 F (36.7 C) (Oral)  Resp 16  SpO2 98% Physical Exam  Constitutional: He is oriented to person, place, and time. He appears well-developed and well-nourished.  HENT:  Head: Normocephalic and atraumatic.  Right Ear: External ear normal.  Left Ear: External ear normal.  Nose: Nose normal.  Eyes: Right eye exhibits no discharge. Left eye exhibits no discharge.  Neck: Neck supple.  Cardiovascular: Normal rate, regular rhythm, normal heart sounds and intact distal pulses.   Pulmonary/Chest: Effort normal and breath sounds normal.  Abdominal: Soft. He exhibits distension. There is tenderness in the suprapubic area.  Suprapubic fullness/tenderness  Genitourinary:  Foley in place, no urine in foley bag. No abnormalities to penis/scrotum  Musculoskeletal: He exhibits no edema.  Neurological: He is alert and oriented to person, place, and time.  Skin: Skin is warm and dry.  Nursing note and vitals reviewed.   ED Course  Procedures (including critical care time) Labs Review Labs Reviewed  I-STAT CHEM 8, ED - Abnormal; Notable for the following:    BUN 25 (*)    Glucose, Bld 123 (*)    Hemoglobin 11.9 (*)    HCT 35.0 (*)    All other components within normal limits    Imaging Review No results found. I have personally reviewed and evaluated these images and lab results as part of my medical decision-making.   EKG Interpretation None      MDM   Final diagnoses:  Urinary retention  Obstructed Foley catheter, initial encounter [facility])    Patient feels significant better after Foley was replaced. Now draining appropriately. Creatinine is unchanged from baseline. No complaints of abdominal pain after his bladder was drained and he has not had any symptoms before the acute obstruction. No fevers or back pain. Thus I highly doubt UTI and likely his urinalysis which show contaminated sample based on  chronic colonization. I discussed with family they agreed to not treat at this time and follow-up with urology.    Sherwood Gambler, MD 08/08/15 [phone]

## 2015-08-30 ENCOUNTER — Emergency Department (HOSPITAL_COMMUNITY)
Admission: EM | Admit: 2015-08-30 | Discharge: 2015-08-31 | Disposition: A | Discharge status: Home / Self Care | Payer: Medicare Other | Attending: Emergency Medicine | Admitting: Emergency Medicine

## 2015-08-30 DIAGNOSIS — Y658 Other specified misadventures during surgical and medical care: Secondary | ICD-10-CM | POA: Insufficient documentation

## 2015-08-30 DIAGNOSIS — Z9889 Other specified postprocedural states: Secondary | ICD-10-CM | POA: Insufficient documentation

## 2015-08-30 DIAGNOSIS — Z8546 Personal history of malignant neoplasm of prostate: Secondary | ICD-10-CM | POA: Diagnosis not present

## 2015-08-30 DIAGNOSIS — Z87891 Personal history of nicotine dependence: Secondary | ICD-10-CM | POA: Diagnosis not present

## 2015-08-30 DIAGNOSIS — I2510 Atherosclerotic heart disease of native coronary artery without angina pectoris: Secondary | ICD-10-CM | POA: Diagnosis not present

## 2015-08-30 DIAGNOSIS — T83098A Other mechanical complication of other indwelling urethral catheter, initial encounter: Secondary | ICD-10-CM | POA: Diagnosis not present

## 2015-08-30 DIAGNOSIS — Z87440 Personal history of urinary (tract) infections: Secondary | ICD-10-CM | POA: Diagnosis not present

## 2015-08-30 DIAGNOSIS — T83031A Leakage of indwelling urethral catheter, initial encounter: Secondary | ICD-10-CM | POA: Insufficient documentation

## 2015-08-30 DIAGNOSIS — Z951 Presence of aortocoronary bypass graft: Secondary | ICD-10-CM | POA: Diagnosis not present

## 2015-08-30 DIAGNOSIS — H9190 Unspecified hearing loss, unspecified ear: Secondary | ICD-10-CM | POA: Diagnosis not present

## 2015-08-30 DIAGNOSIS — Z7982 Long term (current) use of aspirin: Secondary | ICD-10-CM | POA: Insufficient documentation

## 2015-08-30 DIAGNOSIS — Z79899 Other long term (current) drug therapy: Secondary | ICD-10-CM | POA: Diagnosis not present

## 2015-08-30 DIAGNOSIS — T839XXA Unspecified complication of genitourinary prosthetic device, implant and graft, initial encounter: Secondary | ICD-10-CM

## 2015-08-30 DIAGNOSIS — I252 Old myocardial infarction: Secondary | ICD-10-CM | POA: Insufficient documentation

## 2015-08-30 DIAGNOSIS — I10 Essential (primary) hypertension: Secondary | ICD-10-CM | POA: Diagnosis not present

## 2015-08-30 DIAGNOSIS — Z8619 Personal history of other infectious and parasitic diseases: Secondary | ICD-10-CM | POA: Diagnosis not present

## 2015-08-30 DIAGNOSIS — R339 Retention of urine, unspecified: Secondary | ICD-10-CM | POA: Diagnosis present

## 2015-08-30 NOTE — ED Provider Notes (Signed)
CSN: [id number]     Arrival date & time 08/30/15  2139 History  By signing my name below, I, Henry Curtis, attest that this documentation has been prepared under the direction and in the presence of Henry Hacker, MD. Electronically Signed: Doran Curtis, ED Scribe. 08/31/2015. 12:03 AM.   Chief Complaint  Patient presents with  . Urinary Retention  . rattling in chest    HPI HPI Comments:  Henry Curtis is a 82 y.o. male who presents to the Emergency Department complaining of possible foley catheter malfunction, PTA. Pt daughter reports she was trying to flush the foley when it came back on her. Since then, she has been concern and brought the pt in for further evaluation. Pt had the catheter in for 3 weeks. Daughter has noted that the patient's Foley has since been draining. Patient would like to go home.  Daughter reports the patient has recently had "cold symptoms" and would like me to listen to his chest. He denies shortness of breath, chest pain or fevers.   Patient is currently without complaint.   Past Medical History  Diagnosis Date  . Prostate cancer ([place])   . Hypertension   . Myocardial infarction ([place])   . Coronary artery disease involving native coronary artery 12/1989    Unstable Angina  . S/P CABG x 3 12/27/1989    LIMA-D1-LAD, SVG-rPDA (PDA Endarterectomy & patch andioplasty)   . High cholesterol   . Clostridium difficile infection     2015  . Memory difficulty   . Hearing difficulty   . Urinary (tract) obstruction   . Prostate cancer ([place])   . UTI (urinary tract infection)   . Toenail fungus    Past Surgical History  Procedure Laterality Date  . Appendectomy    . Multiple tooth extractions Bilateral   . Coronary artery bypass graft    . Left heart catheterization with coronary/graft angiogram N/A 06/25/2014    Procedure: LEFT HEART CATHETERIZATION WITH Beatrix Fetters;  Surgeon: Leonie Man, MD;  Location: [facility] CATH LAB;  Service: Cardiovascular;   Laterality: N/A;  . Cystoscopy with urethral dilatation N/A 12/21/2014    Procedure: CYSTOSCOPY WITH RETROGRADE AND URETHRAL DILATATION ;  Surgeon: Alexis Frock, MD;  Location: [facility] ORS;  Service: Urology;  Laterality: N/A;   Family History  Problem Relation Age of Onset  . Diabetes Father   . Deep vein thrombosis Father   . Heart attack Brother   . Neuropathy Sister    Social History  Substance Use Topics  . Smoking status: Former Research scientist (life sciences)  . Smokeless tobacco: Not on file  . Alcohol Use: No    Review of Systems  Constitutional: Negative for fever.  Respiratory: Negative for chest tightness and shortness of breath.   Gastrointestinal: Negative for abdominal pain.  Genitourinary:       Foley catheter issue  All other systems reviewed and are negative.     Allergies  Review of patient's allergies indicates no known allergies.  Home Medications   Prior to Admission medications   Medication Sig Start Date End Date Taking? Authorizing Provider  aspirin 81 MG tablet Take 81 mg by mouth daily.   Yes Historical Provider, MD  atorvastatin (LIPITOR) 40 MG tablet Take 40 mg by mouth daily.   Yes Historical Provider, MD  Cholecalciferol (VITAMIN D-3 PO) Take 1 tablet by mouth daily.   Yes Historical Provider, MD  Cyanocobalamin (VITAMIN B-12 PO) Take 1 tablet by mouth daily.   Yes Historical  Provider, MD  finasteride (PROSCAR) 5 MG tablet Take 5 mg by mouth daily.   Yes Historical Provider, MD  losartan (COZAAR) 100 MG tablet Take 100 mg by mouth daily.   Yes Historical Provider, MD  Melatonin 5 MG TABS Take 5 mg by mouth at bedtime.   Yes Historical Provider, MD  metoprolol tartrate (LOPRESSOR) 25 MG tablet 12.5 mg. Take 1/2 tablet by mouth twice a day   Yes Historical Provider, MD  Probiotic Product (PROBIOTIC & ACIDOPHILUS EX ST PO) Take 1 tablet by mouth 2 (two) times daily.    Yes Historical Provider, MD  feeding supplement, ENSURE COMPLETE, (ENSURE COMPLETE) LIQD Take 237 mLs by  mouth daily at 3 pm. Patient not taking: Reported on 08/08/2015 06/29/14   [name], MD   BP 120/48 mmHg  Pulse 83  Temp(Src) 97.6 F (36.4 C) (Oral)  Resp 14  Ht 5\' 9"  (1.753 m)  Wt 175 lb (79.379 kg)  BMI 25.83 kg/m2  SpO2 97% Physical Exam  Constitutional: He is oriented to person, place, and time. He appears well-developed and well-nourished.  Appears younger than stated age  HENT:  Head: Normocephalic and atraumatic.  Cardiovascular: Normal rate, regular rhythm and normal heart sounds.   No murmur heard. Pulmonary/Chest: Effort normal and breath sounds normal. No respiratory distress. He has no wheezes.  Abdominal: Soft. There is no tenderness.  Genitourinary:  Foley catheter in place, draining clear urine  Musculoskeletal: He exhibits no edema.  Neurological: He is alert and oriented to person, place, and time.  Skin: Skin is warm and dry.  Psychiatric: He has a normal mood and affect.  Nursing note and vitals reviewed.   ED Course  Procedures (including critical care time) Labs Review Labs Reviewed - No data to display  Imaging Review No results found. I have personally reviewed and evaluated these images and lab results as part of my medical decision-making.   EKG Interpretation None      MDM   Final diagnoses:  Complication of Foley catheter, initial encounter [facility])    Patient presents with complaints of catheter not draining.  Since being at home, catheter is now draining. Patient would like to be discharged. He is without complaint. His physical exam is reassuring and his vital signs are stable. Foley catheter appears to be draining. We'll have nurse evaluate and ensure no blockage. Follow-up with urology. I offered to obtain a chest x-ray given the daughter's concerns, patient and daughter declined at this time.  After history, exam, and medical workup I feel the patient has been appropriately medically screened and is safe for discharge home.  Pertinent diagnoses were discussed with the patient. Patient was given return precautions.  I personally performed the services described in this documentation, which was scribed in my presence. The recorded information has been reviewed and is accurate.     Henry Hacker, MD 08/31/15 [phone]

## 2015-08-30 NOTE — ED Notes (Signed)
Patient complaining of foley not draining. Patient daughter says that she is hearing something rattling in fathers chest. Patient is not coughing or having any pain.

## 2015-08-31 NOTE — Discharge Instructions (Signed)
Foley Catheter Care, Adult °A Foley catheter is a soft, flexible tube that is placed into the bladder to drain urine. A Foley catheter may be inserted if: °· You leak urine or are not able to control when you urinate (urinary incontinence). °· You are not able to urinate when you need to (urinary retention). °· You had prostate surgery or surgery on the genitals. °· You have certain medical conditions, such as multiple sclerosis, dementia, or a spinal cord injury. °If you are going home with a Foley catheter in place, follow the instructions below. °TAKING CARE OF THE CATHETER °1. Wash your hands with soap and water. °2. Using mild soap and warm water on a clean washcloth: °¨ Clean the area on your body closest to the catheter insertion site using a circular motion, moving away from the catheter. Never wipe toward the catheter because this could sweep bacteria up into the urethra and cause infection. °¨ Remove all traces of soap. Pat the area dry with a clean towel. For males, reposition the foreskin. °3. Attach the catheter to your leg so there is no tension on the catheter. Use adhesive tape or a leg strap. If you are using adhesive tape, remove any sticky residue left behind by the previous tape you used. °4. Keep the drainage bag below the level of the bladder, but keep it off the floor. °5. Check throughout the day to be sure the catheter is working and urine is draining freely. Make sure the tubing does not become kinked. °6. Do not pull on the catheter or try to remove it. Pulling could damage internal tissues. °TAKING CARE OF THE DRAINAGE BAGS °You will be given two drainage bags to take home. One is a large overnight drainage bag, and the other is a smaller leg bag that fits underneath clothing. You may wear the overnight bag at any time, but you should never wear the smaller leg bag at night. Follow the instructions below for how to empty, change, and clean your drainage bags. °Emptying the Drainage  Bag °You must empty your drainage bag when it is  -½ full or at least 2-3 times a day. °1. Wash your hands with soap and water. °2. Keep the drainage bag below your hips, below the level of your bladder. This stops urine from going back into the tubing and into your bladder. °3. Hold the dirty bag over the toilet or a clean container. °4. Open the pour spout at the bottom of the bag and empty the urine into the toilet or container. Do not let the pour spout touch the toilet, container, or any other surface. Doing so can place bacteria on the bag, which can cause an infection. °5. Clean the pour spout with a gauze pad or cotton ball that has rubbing alcohol on it. °6. Close the pour spout. °7. Attach the bag to your leg with adhesive tape or a leg strap. °8. Wash your hands well. °Changing the Drainage Bag °Change your drainage bag once a month or sooner if it starts to smell bad or look dirty. Below are steps to follow when changing the drainage bag. °1. Wash your hands with soap and water. °2. Pinch off the rubber catheter so that urine does not spill out. °3. Disconnect the catheter tube from the drainage tube at the connection valve. Do not let the tubes touch any surface. °4. Clean the end of the catheter tube with an alcohol wipe. Use a different alcohol wipe to clean   the end of the drainage tube. °5. Connect the catheter tube to the drainage tube of the clean drainage bag. °6. Attach the new bag to the leg with adhesive tape or a leg strap. Avoid attaching the new bag too tightly. °7. Wash your hands well. °Cleaning the Drainage Bag °1. Wash your hands with soap and water. °2. Wash the bag in warm, soapy water. °3. Rinse the bag thoroughly with warm water. °4. Fill the bag with a solution of white vinegar and water (1 cup vinegar to 1 qt warm water [.2 L vinegar to 1 L warm water]). Close the bag and soak it for 30 minutes in the solution. °5. Rinse the bag with warm water. °6. Hang the bag to dry with the  pour spout open and hanging downward. °7. Store the clean bag (once it is dry) in a clean plastic bag. °8. Wash your hands well. °PREVENTING INFECTION °· Wash your hands before and after handling your catheter. °· Take showers daily and wash the area where the catheter enters your body. Do not take baths. Replace wet leg straps with dry ones, if this applies. °· Do not use powders, sprays, or lotions on the genital area. Only use creams, lotions, or ointments as directed by your caregiver. °· For females, wipe from front to back after each bowel movement. °· Drink enough fluids to keep your urine clear or pale yellow unless you have a fluid restriction. °· Do not let the drainage bag or tubing touch or lie on the floor. °· Wear cotton underwear to absorb moisture and to keep your skin drier. °SEEK MEDICAL CARE IF:  °· Your urine is cloudy or smells unusually bad. °· Your catheter becomes clogged. °· You are not draining urine into the bag or your bladder feels full. °· Your catheter starts to leak. °SEEK IMMEDIATE MEDICAL CARE IF:  °· You have pain, swelling, redness, or pus where the catheter enters the body. °· You have pain in the abdomen, legs, lower back, or bladder. °· You have a fever. °· You see blood fill the catheter, or your urine is pink or red. °· You have nausea, vomiting, or chills. °· Your catheter gets pulled out. °MAKE SURE YOU:  °· Understand these instructions. °· Will watch your condition. °· Will get help right away if you are not doing well or get worse. °  °This information is not intended to replace advice given to you by your health care provider. Make sure you discuss any questions you have with your health care provider. °  °Document Released: 07/09/2005 Document Revised: 11/23/2013 Document Reviewed: 06/30/2012 °Elsevier Interactive Patient Education ©2016 [employer] ° °

## 2015-09-01 DIAGNOSIS — Z466 Encounter for fitting and adjustment of urinary device: Secondary | ICD-10-CM | POA: Diagnosis not present

## 2015-09-01 DIAGNOSIS — R338 Other retention of urine: Secondary | ICD-10-CM | POA: Diagnosis not present

## 2015-09-01 DIAGNOSIS — I10 Essential (primary) hypertension: Secondary | ICD-10-CM | POA: Diagnosis not present

## 2015-09-01 DIAGNOSIS — N401 Enlarged prostate with lower urinary tract symptoms: Secondary | ICD-10-CM | POA: Diagnosis not present

## 2015-09-01 DIAGNOSIS — N359 Urethral stricture, unspecified: Secondary | ICD-10-CM | POA: Diagnosis not present

## 2015-09-01 DIAGNOSIS — F0390 Unspecified dementia without behavioral disturbance: Secondary | ICD-10-CM | POA: Diagnosis not present

## 2015-09-02 ENCOUNTER — Ambulatory Visit: Payer: Medicare Other | Admitting: Podiatry

## 2015-09-16 DIAGNOSIS — N320 Bladder-neck obstruction: Secondary | ICD-10-CM | POA: Diagnosis not present

## 2015-09-16 DIAGNOSIS — N99111 Postprocedural bulbous urethral stricture: Secondary | ICD-10-CM | POA: Diagnosis not present

## 2015-09-16 DIAGNOSIS — C61 Malignant neoplasm of prostate: Secondary | ICD-10-CM | POA: Diagnosis not present

## 2015-09-23 ENCOUNTER — Ambulatory Visit: Payer: Medicare Other | Admitting: Podiatry

## 2015-09-30 ENCOUNTER — Encounter: Payer: Self-pay | Admitting: Podiatry

## 2015-09-30 ENCOUNTER — Ambulatory Visit (INDEPENDENT_AMBULATORY_CARE_PROVIDER_SITE_OTHER): Payer: Medicare Other | Admitting: Podiatry

## 2015-09-30 DIAGNOSIS — B351 Tinea unguium: Secondary | ICD-10-CM

## 2015-09-30 DIAGNOSIS — M79676 Pain in unspecified toe(s): Secondary | ICD-10-CM

## 2015-09-30 NOTE — Progress Notes (Signed)
Patient ID: Henry Curtis, male   DOB: October 19, 1921, 82 y.o.   MRN: [id number]  Subjective: 82 y.o. returns the office today for painful, elongated, thickened toenails which he is unable to trim himself. Denies any redness or drainage around the nails. Denies any acute changes since last appointment and no new complaints today. Denies any systemic complaints such as fevers, chills, nausea, vomiting.   Objective: AAO 3, NAD DP/PT pulses palpable 1/4, CRT less than 3 seconds Nails hypertrophic, dystrophic, elongated, brittle, discolored 10. There is tenderness overlying the nails 1-5 bilaterally. There is no surrounding erythema or drainage along the nail sites. No open lesions or pre-ulcerative lesions are identified. No other areas of tenderness bilateral lower extremities. No overlying edema, erythema, increased warmth. No pain with calf compression, swelling, warmth, erythema.  Assessment: Patient presents with symptomatic onychomycosis  Plan: -Treatment options including alternatives, risks, complications were discussed -Nails sharply debrided 10 without complication/bleeding. -Discussed daily foot inspection. If there are any changes, to call the office immediately.  -Follow-up in 3 months or sooner if any problems are to arise. In the meantime, encouraged to call the office with any questions, concerns, changes symptoms.  Celesta Gentile, DPM

## 2015-10-04 DIAGNOSIS — F0390 Unspecified dementia without behavioral disturbance: Secondary | ICD-10-CM | POA: Diagnosis not present

## 2015-10-04 DIAGNOSIS — N359 Urethral stricture, unspecified: Secondary | ICD-10-CM | POA: Diagnosis not present

## 2015-10-04 DIAGNOSIS — Z466 Encounter for fitting and adjustment of urinary device: Secondary | ICD-10-CM | POA: Diagnosis not present

## 2015-10-04 DIAGNOSIS — N401 Enlarged prostate with lower urinary tract symptoms: Secondary | ICD-10-CM | POA: Diagnosis not present

## 2015-10-04 DIAGNOSIS — R338 Other retention of urine: Secondary | ICD-10-CM | POA: Diagnosis not present

## 2015-10-04 DIAGNOSIS — I10 Essential (primary) hypertension: Secondary | ICD-10-CM | POA: Diagnosis not present

## 2015-10-21 ENCOUNTER — Emergency Department (HOSPITAL_COMMUNITY)
Admission: EM | Admit: 2015-10-21 | Discharge: 2015-10-21 | Disposition: A | Discharge status: Home / Self Care | Payer: Medicare Other | Attending: Emergency Medicine | Admitting: Emergency Medicine

## 2015-10-21 ENCOUNTER — Encounter (HOSPITAL_COMMUNITY): Payer: Self-pay | Admitting: Emergency Medicine

## 2015-10-21 DIAGNOSIS — I2510 Atherosclerotic heart disease of native coronary artery without angina pectoris: Secondary | ICD-10-CM | POA: Diagnosis not present

## 2015-10-21 DIAGNOSIS — Z7982 Long term (current) use of aspirin: Secondary | ICD-10-CM | POA: Diagnosis not present

## 2015-10-21 DIAGNOSIS — Z8546 Personal history of malignant neoplasm of prostate: Secondary | ICD-10-CM | POA: Insufficient documentation

## 2015-10-21 DIAGNOSIS — Z79899 Other long term (current) drug therapy: Secondary | ICD-10-CM | POA: Diagnosis not present

## 2015-10-21 DIAGNOSIS — Z9889 Other specified postprocedural states: Secondary | ICD-10-CM | POA: Insufficient documentation

## 2015-10-21 DIAGNOSIS — H9190 Unspecified hearing loss, unspecified ear: Secondary | ICD-10-CM | POA: Diagnosis not present

## 2015-10-21 DIAGNOSIS — Z951 Presence of aortocoronary bypass graft: Secondary | ICD-10-CM | POA: Diagnosis not present

## 2015-10-21 DIAGNOSIS — R319 Hematuria, unspecified: Secondary | ICD-10-CM | POA: Diagnosis not present

## 2015-10-21 DIAGNOSIS — Z87891 Personal history of nicotine dependence: Secondary | ICD-10-CM | POA: Diagnosis not present

## 2015-10-21 DIAGNOSIS — Z8619 Personal history of other infectious and parasitic diseases: Secondary | ICD-10-CM | POA: Diagnosis not present

## 2015-10-21 DIAGNOSIS — N3000 Acute cystitis without hematuria: Secondary | ICD-10-CM | POA: Diagnosis not present

## 2015-10-21 DIAGNOSIS — Z87440 Personal history of urinary (tract) infections: Secondary | ICD-10-CM | POA: Insufficient documentation

## 2015-10-21 DIAGNOSIS — I252 Old myocardial infarction: Secondary | ICD-10-CM | POA: Diagnosis not present

## 2015-10-21 DIAGNOSIS — E7800 Pure hypercholesterolemia, unspecified: Secondary | ICD-10-CM | POA: Diagnosis not present

## 2015-10-21 DIAGNOSIS — N3001 Acute cystitis with hematuria: Secondary | ICD-10-CM | POA: Diagnosis not present

## 2015-10-21 DIAGNOSIS — T83198A Other mechanical complication of other urinary devices and implants, initial encounter: Secondary | ICD-10-CM | POA: Diagnosis not present

## 2015-10-21 DIAGNOSIS — I10 Essential (primary) hypertension: Secondary | ICD-10-CM | POA: Insufficient documentation

## 2015-10-21 DIAGNOSIS — T83498A Other mechanical complication of other prosthetic devices, implants and grafts of genital tract, initial encounter: Secondary | ICD-10-CM | POA: Diagnosis not present

## 2015-10-21 LAB — CBC WITH DIFFERENTIAL/PLATELET
BASOS ABS: 0 10*3/uL (ref 0.0–0.1)
BASOS PCT: 1 %
Eosinophils Absolute: 0.5 10*3/uL (ref 0.0–0.7)
Eosinophils Relative: 9 %
HCT: 35.6 % — ABNORMAL LOW (ref 39.0–52.0)
Hemoglobin: 12.3 g/dL — ABNORMAL LOW (ref 13.0–17.0)
LYMPHS ABS: 2.4 10*3/uL (ref 0.7–4.0)
Lymphocytes Relative: 41 %
MCH: 31.9 pg (ref 26.0–34.0)
MCHC: 34.6 g/dL (ref 30.0–36.0)
MCV: 92.5 fL (ref 78.0–100.0)
MONOS PCT: 18 %
Monocytes Absolute: 1 10*3/uL (ref 0.1–1.0)
NEUTROS ABS: 1.8 10*3/uL (ref 1.7–7.7)
NEUTROS PCT: 31 %
PLATELETS: 158 10*3/uL (ref 150–400)
RBC: 3.85 MIL/uL — ABNORMAL LOW (ref 4.22–5.81)
RDW: 13.5 % (ref 11.5–15.5)
WBC: 5.8 10*3/uL (ref 4.0–10.5)

## 2015-10-21 LAB — PROTIME-INR
INR: 1.06 (ref 0.00–1.49)
Prothrombin Time: 14 seconds (ref 11.6–15.2)

## 2015-10-21 LAB — URINALYSIS, ROUTINE W REFLEX MICROSCOPIC
Glucose, UA: NEGATIVE mg/dL
KETONES UR: 40 mg/dL — AB
NITRITE: POSITIVE — AB
PH: 5 (ref 5.0–8.0)
Specific Gravity, Urine: 1.018 (ref 1.005–1.030)

## 2015-10-21 LAB — COMPREHENSIVE METABOLIC PANEL
ALT: 14 U/L — ABNORMAL LOW (ref 17–63)
ANION GAP: 4 — AB (ref 5–15)
AST: 18 U/L (ref 15–41)
Albumin: 3.2 g/dL — ABNORMAL LOW (ref 3.5–5.0)
Alkaline Phosphatase: 50 U/L (ref 38–126)
BUN: 19 mg/dL (ref 6–20)
CHLORIDE: 106 mmol/L (ref 101–111)
CO2: 27 mmol/L (ref 22–32)
Calcium: 8.9 mg/dL (ref 8.9–10.3)
Creatinine, Ser: 1.04 mg/dL (ref 0.61–1.24)
GFR calc non Af Amer: >60 mL/min (ref >60)
Glucose, Bld: 122 mg/dL — ABNORMAL HIGH (ref 65–99)
Potassium: 4.7 mmol/L (ref 3.5–5.1)
SODIUM: 137 mmol/L (ref 135–145)
Total Bilirubin: 0.6 mg/dL (ref 0.3–1.2)
Total Protein: 6.6 g/dL (ref 6.5–8.1)

## 2015-10-21 LAB — URINE MICROSCOPIC-ADD ON

## 2015-10-21 MED ORDER — CIPROFLOXACIN HCL 500 MG PO TABS: 500.0000 mg | ORAL_TABLET | Freq: Two times a day (BID) | ORAL | Status: DC

## 2015-10-21 MED ORDER — DEXTROSE 5 % IV SOLN
1.0000 g | Freq: Once | INTRAVENOUS | Status: AC
  Administered 2015-10-21: 1 g via INTRAVENOUS
  Filled 2015-10-21: qty 10

## 2015-10-21 NOTE — ED Notes (Signed)
Bed: WA04 Expected date:  Expected time:  Means of arrival:  Comments: EMS 

## 2015-10-21 NOTE — ED Notes (Signed)
2100 - rocephin completed and catherter flushed until clear

## 2015-10-21 NOTE — ED Provider Notes (Signed)
CSN: [id number]     Arrival date & time 10/21/15  1704 History   First MD Initiated Contact with Patient 10/21/15 1711     Chief Complaint  Patient presents with  . Hematuria     (Consider location/radiation/quality/duration/timing/severity/associated sxs/prior Treatment) HPI   This is a 82 year old male who presents emergency department for bleeding from his penis. He has a history of prostate cancer and has a chronic indwelling Foley catheter. He noticed bleeding from his penis this morning. When his home health nurse came out to change his catheter. They were shocked by a large amount of blood coming from the penis. EMS reports to nursing that he appears to have tearing at the bottom of his penile meatus. His catheter is a brand-new and was changed by his home health nurse. Patient is unsure of when this started. He denies pain in his urethra. He denies any suprapubic pain, chills, fever, or other urinary symptoms. He is no longer taking a blood thinner.  Past Medical History  Diagnosis Date  . Prostate cancer ([place])   . Hypertension   . Myocardial infarction ([place])   . Coronary artery disease involving native coronary artery 12/1989    Unstable Angina  . S/P CABG x 3 12/27/1989    LIMA-D1-LAD, SVG-rPDA (PDA Endarterectomy & patch andioplasty)   . High cholesterol   . Clostridium difficile infection     2015  . Memory difficulty   . Hearing difficulty   . Urinary (tract) obstruction   . Prostate cancer ([place])   . UTI (urinary tract infection)   . Toenail fungus    Past Surgical History  Procedure Laterality Date  . Appendectomy    . Multiple tooth extractions Bilateral   . Coronary artery bypass graft    . Left heart catheterization with coronary/graft angiogram N/A 06/25/2014    Procedure: LEFT HEART CATHETERIZATION WITH Beatrix Fetters;  Surgeon: Leonie Man, MD;  Location: [facility] CATH LAB;  Service: Cardiovascular;  Laterality: N/A;  . Cystoscopy with urethral  dilatation N/A 12/21/2014    Procedure: CYSTOSCOPY WITH RETROGRADE AND URETHRAL DILATATION ;  Surgeon: Alexis Frock, MD;  Location: [facility] ORS;  Service: Urology;  Laterality: N/A;   Family History  Problem Relation Age of Onset  . Diabetes Father   . Deep vein thrombosis Father   . Heart attack Brother   . Neuropathy Sister    Social History  Substance Use Topics  . Smoking status: Former Research scientist (life sciences)  . Smokeless tobacco: None  . Alcohol Use: No    Review of Systems  Ten systems reviewed and are negative for acute change, except as noted in the HPI.    Allergies  Review of patient's allergies indicates no known allergies.  Home Medications   Prior to Admission medications   Medication Sig Start Date End Date Taking? Authorizing Provider  aspirin 81 MG tablet Take 81 mg by mouth daily.    Historical Provider, MD  atorvastatin (LIPITOR) 40 MG tablet Take 40 mg by mouth daily.    Historical Provider, MD  Cholecalciferol (VITAMIN D-3 PO) Take 1 tablet by mouth daily.    Historical Provider, MD  Cyanocobalamin (VITAMIN B-12 PO) Take 1 tablet by mouth daily.    Historical Provider, MD  feeding supplement, ENSURE COMPLETE, (ENSURE COMPLETE) LIQD Take 237 mLs by mouth daily at 3 pm. Patient not taking: Reported on 08/08/2015 06/29/14   [name], MD  finasteride (PROSCAR) 5 MG tablet Take 5 mg by mouth daily.  Historical Provider, MD  losartan (COZAAR) 100 MG tablet Take 100 mg by mouth daily.    Historical Provider, MD  Melatonin 5 MG TABS Take 5 mg by mouth at bedtime.    Historical Provider, MD  metoprolol tartrate (LOPRESSOR) 25 MG tablet 12.5 mg. Take 1/2 tablet by mouth twice a day    Historical Provider, MD  Probiotic Product (PROBIOTIC & ACIDOPHILUS EX ST PO) Take 1 tablet by mouth 2 (two) times daily.     Historical Provider, MD   There were no vitals taken for this visit. Physical Exam  Constitutional: He appears well-developed and well-nourished. No distress.  HENT:   Head: Normocephalic and atraumatic.  Eyes: Conjunctivae are normal. No scleral icterus.  Neck: Normal range of motion. Neck supple.  Cardiovascular: Normal rate, regular rhythm and normal heart sounds.   Pulmonary/Chest: Effort normal and breath sounds normal. No respiratory distress.  Abdominal: Soft. There is no tenderness.  Genitourinary:  Meatus is split on the ventral surface of the penis. Does not appear to be a new tear. Appears well-healed. Thick clots from around the catheter appears to be from the urethra  Musculoskeletal: He exhibits no edema.  Neurological: He is alert.  Skin: Skin is warm and dry. He is not diaphoretic.  Psychiatric: His behavior is normal.  Nursing note and vitals reviewed.   ED Course  Procedures (including critical care time) Labs Review Labs Reviewed - No data to display  Imaging Review No results found. I have personally reviewed and evaluated these images and lab results as part of my medical decision-making.   EKG Interpretation None      MDM   Final diagnoses:  Acute cystitis with hematuria    Patient with nitrite-positive urine. Seen in shared visit with Dr. Billy Fischer. He agreed that this is most likely acute hemorrhagic cystitis. Clots are coming from above the urethral meatus. Patient just had his catheter changed today. Patient given IV Rocephin here. Bleeding is controlled. His Foley catheter was flushed and is producing urine easily. Patient will be discharged on Cipro for 10 days. He is advised to follow-up with his primary care physician in the next day or 2. He states he is feeling very well. Appears safe for discharge at this time    Margarita Mail, PA-C 10/21/15 [place], MD 10/24/15 2130

## 2015-10-21 NOTE — ED Notes (Addendum)
Per EMS pt called out by home health-states they were there placing catheter.  Patient now has torn meatus.  Per EMS patient is currently bleeding.  Patient is on blood thinners.  Patient on arrival with foley catheter.

## 2015-10-21 NOTE — Discharge Instructions (Signed)

## 2015-10-24 ENCOUNTER — Ambulatory Visit: Payer: Medicare Other | Admitting: Neurology

## 2015-10-27 ENCOUNTER — Ambulatory Visit (INDEPENDENT_AMBULATORY_CARE_PROVIDER_SITE_OTHER): Payer: Medicare Other | Admitting: Neurology

## 2015-10-27 ENCOUNTER — Encounter: Payer: Self-pay | Admitting: Neurology

## 2015-10-27 VITALS — BP 110/58 | HR 62 | Resp 16 | Ht 69.0 in | Wt 177.0 lb

## 2015-10-27 DIAGNOSIS — R339 Retention of urine, unspecified: Secondary | ICD-10-CM

## 2015-10-27 DIAGNOSIS — R269 Unspecified abnormalities of gait and mobility: Secondary | ICD-10-CM | POA: Diagnosis not present

## 2015-10-27 DIAGNOSIS — R413 Other amnesia: Secondary | ICD-10-CM

## 2015-10-27 MED ORDER — DONEPEZIL HCL 5 MG PO TABS: 5.0000 mg | ORAL_TABLET | Freq: Every day | ORAL | Status: DC

## 2015-10-27 NOTE — Progress Notes (Signed)
[facility]  PATIENT: Henry Curtis DOB: [birth year]  REFERRING DOCTOR OR PCP:  Deland Pretty SOURCE: patient and records from Dr. Shelia Media  _________________________________   HISTORICAL  CHIEF COMPLAINT:  Chief Complaint  Patient presents with  . Memory Loss    Henry Curtis is here with his caregiver, Lawerance Bach.  Donepezil was increased to 10mg  daily at last ov, but [facility] sev. mos. ago, Donepezil was d/c due to sleep disturbance, increased confusion.  [facility]. they have not noted any decline in memory since he stopped Donepezil/fim  . [place]    MOCA completed at this visit./fm    HISTORY OF PRESENT ILLNESS:  Henry Curtis is a 82 year old right-handed man with memory and gait disturbances.      Memory:     He has had issues with short term memory for several years.  Memory issues worsened in December 2015 after he was hospitalized for cardiac reasons.   He stopped driving at that time.  [facility] 12/2014 t was 17/30.   He was placed on Namenda earlier in the year ago but during the fourth week, he had hematuria and it was stopped. He also seemed to have more confusion intermittently with it, however.Marland Kitchen  He was been on Aricept for the past several months but he did not tolerate 10 mg (poor sleep and caregiver felt he was more confused. He seemed to have done better on 5 mg   CT  In July shows age related atrophy and small vessel changes.   He sleeps well at night.      Montreal Cognitive Assessment  10/27/2015  Visuospatial/ Executive (0/5) 4  Naming (0/3) 3  Attention: Read list of digits (0/2) 2  Attention: Read list of letters (0/1) 1  Attention: Serial 7 subtraction starting at 100 (0/3) 3  Language: Repeat phrase (0/2) 2  Language : Fluency (0/1) 0  Abstraction (0/2) 0  Delayed Recall (0/5) 1  Orientation (0/6) 2  Total 18    Gait:  He has had progressive difficulty with his walking x many years. Walking began to worsen in 2009 after he had shingles. His  gait has been more shuffling over the past year or 2-3 years.  He  now will use a cane every time he is up.    He used it intermittently since 2009.    He is able to walk shorter distances without the cane.    No recent falls.  Bladder:      Since his hospital stay for his cardiac catheterization (06/2014) he had more issues with his bladder and required an indwelling catheter.  Of note, he had prostate cancer with radioactive seeds and he is felt to possibly have scarring.    He continues to need the indwelling catheter. He has pulled it out a couple times over the past year when he has been more confused.   REVIEW OF SYSTEMS: Constitutional: No fevers, chills, sweats, or change in appetite Eyes: No visual changes, double vision, eye pain Ear, nose and throat: No hearing loss, ear pain, nasal congestion, sore throat Cardiovascular: recent MI in 12/15.   No current CP.   Respiratory: No shortness of breath at rest or with exertion.   No wheezes GastrointestinaI: No nausea, vomiting, diarrhea, abdominal pain, fecal incontinence Genitourinary: see above Musculoskeletal: No neck pain, back pain Integumentary: No rash, pruritus, skin lesions Neurological: as above Psychiatric: No depression at this time.  No anxiety Endocrine: No palpitations, diaphoresis, change in appetite,  change in weigh or increased thirst Hematologic/Lymphatic: No anemia, purpura, petechiae. Allergic/Immunologic: No itchy/runny eyes, nasal congestion, recent allergic reactions, rashes  ALLERGIES: No Known Allergies  HOME MEDICATIONS:  Current outpatient prescriptions:  .  aspirin 81 MG tablet, Take 81 mg by mouth daily., Disp: , Rfl:  .  atorvastatin (LIPITOR) 40 MG tablet, Take 40 mg by mouth daily., Disp: , Rfl:  .  Cholecalciferol (VITAMIN D-3 PO), Take 1 tablet by mouth daily., Disp: , Rfl:  .  ciprofloxacin (CIPRO) 500 MG tablet, Take 1 tablet (500 mg total) by mouth every 12 (twelve) hours., Disp: 20 tablet,  Rfl: 0 .  Cyanocobalamin (VITAMIN B-12 PO), Take 1 tablet by mouth daily., Disp: , Rfl:  .  finasteride (PROSCAR) 5 MG tablet, Take 5 mg by mouth daily., Disp: , Rfl:  .  losartan (COZAAR) 100 MG tablet, Take 100 mg by mouth daily., Disp: , Rfl:  .  Melatonin 10 MG TABS, Take by mouth., Disp: , Rfl:  .  metoprolol tartrate (LOPRESSOR) 25 MG tablet, Take 12.5 mg by mouth 2 (two) times daily. , Disp: , Rfl:  .  Probiotic Product (PROBIOTIC & ACIDOPHILUS EX ST PO), Take 1 tablet by mouth 2 (two) times daily. , Disp: , Rfl:   PAST MEDICAL HISTORY: Past Medical History  Diagnosis Date  . Prostate cancer ([place])   . Hypertension   . Myocardial infarction ([place])   . Coronary artery disease involving native coronary artery 12/1989    Unstable Angina  . S/P CABG x 3 12/27/1989    LIMA-D1-LAD, SVG-rPDA (PDA Endarterectomy & patch andioplasty)   . High cholesterol   . Clostridium difficile infection     2015  . Memory difficulty   . Hearing difficulty   . Urinary (tract) obstruction   . Prostate cancer ([place])   . UTI (urinary tract infection)   . Toenail fungus     PAST SURGICAL HISTORY: Past Surgical History  Procedure Laterality Date  . Appendectomy    . Multiple tooth extractions Bilateral   . Coronary artery bypass graft    . Left heart catheterization with coronary/graft angiogram N/A 06/25/2014    Procedure: LEFT HEART CATHETERIZATION WITH Beatrix Fetters;  Surgeon: Leonie Man, MD;  Location: [facility] CATH LAB;  Service: Cardiovascular;  Laterality: N/A;  . Cystoscopy with urethral dilatation N/A 12/21/2014    Procedure: CYSTOSCOPY WITH RETROGRADE AND URETHRAL DILATATION ;  Surgeon: Alexis Frock, MD;  Location: [facility] ORS;  Service: Urology;  Laterality: N/A;    FAMILY HISTORY: Family History  Problem Relation Age of Onset  . Diabetes Father   . Deep vein thrombosis Father   . Heart attack Brother   . Neuropathy Sister     SOCIAL HISTORY:  Social History   Social History   . Marital Status: Widowed    Spouse Name: N/A  . Number of Children: N/A  . Years of Education: N/A   Occupational History  . Not on file.   Social History Main Topics  . Smoking status: Former Research scientist (life sciences)  . Smokeless tobacco: Not on file  . Alcohol Use: No  . Drug Use: No  . Sexual Activity: Not on file   Other Topics Concern  . Not on file   Social History Narrative   Diet heart healthy - sweets in moderation       Do you drink/ eat things with caffeine? No       Marital status: Widowed Twice  What year were you married ?      Do you live in a house, apartment,assistred living, condo, trailer, etc.)? Home      Is it one or more stories? 2 stories      How many persons live in your home ? 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  [place]                     If not, do you want to discuss one?       Do you have signed POA?HPOA forms?  Yes               If so, please bring to your        appointment           PHYSICAL EXAM  Filed Vitals:   10/27/15 1048  BP: 110/58  Pulse: 62  Resp: 16  Height: 5\' 9"  (1.753 m)  Weight: 177 lb (80.287 kg)    Body mass index is 26.13 kg/(m^2).   General: The patient is well-developed and well-nourished and in no acute distress   Neurologic Exam  Mental status:  The patient is alert and not oriented to time.   Short term memory was 1/3 without hints but 3/3 with category prompts.  Attention was reduced.  Cranial nerves: Extraocular movements are full.    Facial symmetry is present. There is good facial sensation to soft touch bilaterally.Facial strength is normal.  Trapezius and sternocleidomastoid strength is normal. No dysarthria is noted.    No obvious  hearing deficits are noted.  Motor:  Muscle bulk is normal.   Tone is normal. Strength is  5 / 5 in all 4 extremities.   Sensory: Sensory testing is intact to touch and vibration sensation in all 4 extremities.  Coordination: Cerebellar testing reveals good finger-nose-finger and reduced symmetric heel-to-shin bilaterally.  Gait and station: Station is normal.   Gait is wide with a short stride. He turns 180 in 5 steps (same as last visit). He has mild retropulsion. Romberg is negative.   Reflexes: Deep tendon reflexes are symmetric and normal bilaterally.       DIAGNOSTIC DATA (LABS, IMAGING, TESTING) - I reviewed patient records, labs, notes, testing and imaging myself where available.  Lab Results  Component Value Date   WBC 5.8 10/21/2015   HGB 12.3* 10/21/2015   HCT 35.6* 10/21/2015   MCV 92.5 10/21/2015   PLT 158 10/21/2015      Component Value Date/Time   NA 137 10/21/2015 1752   K 4.7 10/21/2015 1752   CL 106 10/21/2015 1752   CO2 27 10/21/2015 1752   GLUCOSE 122* 10/21/2015 1752   BUN 19 10/21/2015 1752   CREATININE 1.04 10/21/2015 1752   CALCIUM 8.9 10/21/2015 1752   PROT 6.6 10/21/2015 1752   ALBUMIN 3.2* 10/21/2015 1752   AST 18 10/21/2015 1752   ALT 14* 10/21/2015 1752   ALKPHOS 50  10/21/2015 1752   BILITOT 0.6 10/21/2015 1752   GFRNONAA >60 10/21/2015 1752   GFRAA >60 10/21/2015 1752   Lab Results  Component Value Date   CHOL 147 06/24/2014   HDL 42 06/24/2014   LDLCALC 94 06/24/2014   TRIG 57 06/24/2014   CHOLHDL 3.5 06/24/2014   Lab Results  Component Value Date   HGBA1C 5.2 06/24/2014   Lab Results  Component Value Date   [id number] 01/20/2015   Lab Results  Component Value Date   TSH 2.070 01/20/2015       ASSESSMENT AND PLAN  Memory loss  Gait disturbance  Urinary retention   1.  Restart Donepezil but keep dose at 5 mg.   2.   Use a cane when he walks for safety.  3.   Return to see me in 12 months or sooner if  there are new or worsening neurologic symptoms.  Keiyon Plack A. Felecia Shelling, MD, PhD [id number], [id number] AM Certified in Neurology, Clinical Neurophysiology, Sleep Medicine, Pain Medicine and Neuroimaging  [facility] [address] [place], [place] [address] [phone]

## 2015-11-18 DIAGNOSIS — F0390 Unspecified dementia without behavioral disturbance: Secondary | ICD-10-CM | POA: Diagnosis not present

## 2015-11-18 DIAGNOSIS — I10 Essential (primary) hypertension: Secondary | ICD-10-CM | POA: Diagnosis not present

## 2015-11-18 DIAGNOSIS — R338 Other retention of urine: Secondary | ICD-10-CM | POA: Diagnosis not present

## 2015-11-18 DIAGNOSIS — N359 Urethral stricture, unspecified: Secondary | ICD-10-CM | POA: Diagnosis not present

## 2015-11-18 DIAGNOSIS — N401 Enlarged prostate with lower urinary tract symptoms: Secondary | ICD-10-CM | POA: Diagnosis not present

## 2015-11-18 DIAGNOSIS — Z466 Encounter for fitting and adjustment of urinary device: Secondary | ICD-10-CM | POA: Diagnosis not present

## 2015-11-24 DIAGNOSIS — N390 Urinary tract infection, site not specified: Secondary | ICD-10-CM | POA: Diagnosis not present

## 2015-11-24 DIAGNOSIS — R109 Unspecified abdominal pain: Secondary | ICD-10-CM | POA: Diagnosis not present

## 2015-12-08 DIAGNOSIS — N390 Urinary tract infection, site not specified: Secondary | ICD-10-CM | POA: Diagnosis not present

## 2016-01-06 ENCOUNTER — Ambulatory Visit: Payer: Medicare Other | Admitting: Podiatry

## 2016-01-09 ENCOUNTER — Encounter: Payer: Self-pay | Admitting: Podiatry

## 2016-01-09 ENCOUNTER — Ambulatory Visit (INDEPENDENT_AMBULATORY_CARE_PROVIDER_SITE_OTHER): Payer: Medicare Other | Admitting: Podiatry

## 2016-01-09 DIAGNOSIS — M79676 Pain in unspecified toe(s): Secondary | ICD-10-CM

## 2016-01-09 DIAGNOSIS — B351 Tinea unguium: Secondary | ICD-10-CM

## 2016-01-09 NOTE — Progress Notes (Signed)
Patient ID: Henry Curtis, male   DOB: October 19, 1921, 82 y.o.   MRN: [id number]  Subjective: 82 y.o. returns the office today for painful, elongated, thickened toenails which he is unable to trim himself. Denies any redness or drainage around the nails. Denies any acute changes since last appointment and no new complaints today. Denies any systemic complaints such as fevers, chills, nausea, vomiting.   Objective: AAO 3, NAD DP/PT pulses palpable 1/4, CRT less than 3 seconds Nails hypertrophic, dystrophic, elongated, brittle, discolored 10. There is tenderness overlying the nails 1-5 bilaterally. There is no surrounding erythema or drainage along the nail sites. No open lesions or pre-ulcerative lesions are identified. No other areas of tenderness bilateral lower extremities. No overlying edema, erythema, increased warmth. No pain with calf compression, swelling, warmth, erythema.  Assessment: Patient presents with symptomatic onychomycosis  Plan: -Treatment options including alternatives, risks, complications were discussed -Nails sharply debrided 10 without complication/bleeding. -Discussed daily foot inspection. If there are any changes, to call the office immediately.  -Follow-up in 3 months or sooner if any problems are to arise. In the meantime, encouraged to call the office with any questions, concerns, changes symptoms.  Celesta Gentile, DPM

## 2016-01-16 DIAGNOSIS — N359 Urethral stricture, unspecified: Secondary | ICD-10-CM | POA: Diagnosis not present

## 2016-01-16 DIAGNOSIS — C61 Malignant neoplasm of prostate: Secondary | ICD-10-CM | POA: Diagnosis not present

## 2016-01-16 DIAGNOSIS — R338 Other retention of urine: Secondary | ICD-10-CM | POA: Diagnosis not present

## 2016-01-16 DIAGNOSIS — N401 Enlarged prostate with lower urinary tract symptoms: Secondary | ICD-10-CM | POA: Diagnosis not present

## 2016-01-23 DIAGNOSIS — E559 Vitamin D deficiency, unspecified: Secondary | ICD-10-CM | POA: Diagnosis not present

## 2016-01-23 DIAGNOSIS — I10 Essential (primary) hypertension: Secondary | ICD-10-CM | POA: Diagnosis not present

## 2016-01-23 DIAGNOSIS — I2510 Atherosclerotic heart disease of native coronary artery without angina pectoris: Secondary | ICD-10-CM | POA: Diagnosis not present

## 2016-01-23 DIAGNOSIS — Z0000 Encounter for general adult medical examination without abnormal findings: Secondary | ICD-10-CM | POA: Diagnosis not present

## 2016-01-23 DIAGNOSIS — Z131 Encounter for screening for diabetes mellitus: Secondary | ICD-10-CM | POA: Diagnosis not present

## 2016-01-31 DIAGNOSIS — I10 Essential (primary) hypertension: Secondary | ICD-10-CM | POA: Diagnosis not present

## 2016-01-31 DIAGNOSIS — Z0000 Encounter for general adult medical examination without abnormal findings: Secondary | ICD-10-CM | POA: Diagnosis not present

## 2016-01-31 DIAGNOSIS — E559 Vitamin D deficiency, unspecified: Secondary | ICD-10-CM | POA: Diagnosis not present

## 2016-01-31 DIAGNOSIS — R7309 Other abnormal glucose: Secondary | ICD-10-CM | POA: Diagnosis not present

## 2016-02-07 DIAGNOSIS — F0390 Unspecified dementia without behavioral disturbance: Secondary | ICD-10-CM | POA: Diagnosis not present

## 2016-02-07 DIAGNOSIS — I10 Essential (primary) hypertension: Secondary | ICD-10-CM | POA: Diagnosis not present

## 2016-02-07 DIAGNOSIS — R338 Other retention of urine: Secondary | ICD-10-CM | POA: Diagnosis not present

## 2016-02-07 DIAGNOSIS — N401 Enlarged prostate with lower urinary tract symptoms: Secondary | ICD-10-CM | POA: Diagnosis not present

## 2016-02-07 DIAGNOSIS — Z466 Encounter for fitting and adjustment of urinary device: Secondary | ICD-10-CM | POA: Diagnosis not present

## 2016-02-07 DIAGNOSIS — N359 Urethral stricture, unspecified: Secondary | ICD-10-CM | POA: Diagnosis not present

## 2016-02-18 ENCOUNTER — Encounter (HOSPITAL_COMMUNITY): Payer: Self-pay | Admitting: Emergency Medicine

## 2016-02-18 ENCOUNTER — Emergency Department (HOSPITAL_COMMUNITY)
Admission: EM | Admit: 2016-02-18 | Discharge: 2016-02-18 | Disposition: A | Discharge status: Home / Self Care | Payer: Medicare Other | Attending: Emergency Medicine | Admitting: Emergency Medicine

## 2016-02-18 DIAGNOSIS — Y738 Miscellaneous gastroenterology and urology devices associated with adverse incidents, not elsewhere classified: Secondary | ICD-10-CM | POA: Diagnosis not present

## 2016-02-18 DIAGNOSIS — I10 Essential (primary) hypertension: Secondary | ICD-10-CM | POA: Insufficient documentation

## 2016-02-18 DIAGNOSIS — Z87891 Personal history of nicotine dependence: Secondary | ICD-10-CM | POA: Insufficient documentation

## 2016-02-18 DIAGNOSIS — Z7982 Long term (current) use of aspirin: Secondary | ICD-10-CM | POA: Diagnosis not present

## 2016-02-18 DIAGNOSIS — T83198A Other mechanical complication of other urinary devices and implants, initial encounter: Secondary | ICD-10-CM | POA: Diagnosis not present

## 2016-02-18 DIAGNOSIS — Z955 Presence of coronary angioplasty implant and graft: Secondary | ICD-10-CM | POA: Insufficient documentation

## 2016-02-18 DIAGNOSIS — I252 Old myocardial infarction: Secondary | ICD-10-CM | POA: Diagnosis not present

## 2016-02-18 DIAGNOSIS — T83498A Other mechanical complication of other prosthetic devices, implants and grafts of genital tract, initial encounter: Secondary | ICD-10-CM | POA: Diagnosis not present

## 2016-02-18 DIAGNOSIS — N390 Urinary tract infection, site not specified: Secondary | ICD-10-CM

## 2016-02-18 DIAGNOSIS — Z8546 Personal history of malignant neoplasm of prostate: Secondary | ICD-10-CM | POA: Diagnosis not present

## 2016-02-18 DIAGNOSIS — I2510 Atherosclerotic heart disease of native coronary artery without angina pectoris: Secondary | ICD-10-CM | POA: Insufficient documentation

## 2016-02-18 DIAGNOSIS — T83098A Other mechanical complication of other indwelling urethral catheter, initial encounter: Secondary | ICD-10-CM | POA: Diagnosis not present

## 2016-02-18 DIAGNOSIS — Z79899 Other long term (current) drug therapy: Secondary | ICD-10-CM | POA: Diagnosis not present

## 2016-02-18 DIAGNOSIS — T839XXA Unspecified complication of genitourinary prosthetic device, implant and graft, initial encounter: Secondary | ICD-10-CM

## 2016-02-18 DIAGNOSIS — R339 Retention of urine, unspecified: Secondary | ICD-10-CM | POA: Diagnosis present

## 2016-02-18 LAB — URINALYSIS, ROUTINE W REFLEX MICROSCOPIC
Bilirubin Urine: NEGATIVE
Glucose, UA: NEGATIVE mg/dL
KETONES UR: NEGATIVE mg/dL
NITRITE: NEGATIVE
PROTEIN: NEGATIVE mg/dL
Specific Gravity, Urine: 1.015 (ref 1.005–1.030)
pH: 6.5 (ref 5.0–8.0)

## 2016-02-18 LAB — URINE MICROSCOPIC-ADD ON: SQUAMOUS EPITHELIAL / LPF: NONE SEEN

## 2016-02-18 MED ORDER — SULFAMETHOXAZOLE-TRIMETHOPRIM 800-160 MG PO TABS: 1.0000 | ORAL_TABLET | Freq: Two times a day (BID) | ORAL | 0 refills | Status: AC

## 2016-02-18 NOTE — ED Notes (Signed)
Bed: [id number] Expected date:  Expected time:  Means of arrival:  Comments: 44 urinary retention

## 2016-02-18 NOTE — ED Triage Notes (Signed)
Per EMS pt has a chronic indwelling foley.  Last night foley became clogged and pt did not have an extra foley to replace it with.  Pt is A&O 4.

## 2016-02-18 NOTE — ED Provider Notes (Signed)
[facility] DEPT Provider Note   CSN: [id number] Arrival date & time: 02/18/16  [id number]  First Provider Contact:  First MD Initiated Contact with Patient 02/18/16 [phone]        History   Chief Complaint Chief Complaint  Patient presents with  . Urinary Retention    HPI Henry Curtis is a 82 y.o. male.  82 year old male with history of urinary retention presents for clogged Foley catheter. The patient is unsure the last time it was changed but says that it has been a while. Patient has never had any complications or difficulties with exchange of the Foley catheter and is usually done at home by his home health nurse. The patient states since yesterday it has seemed like the Foley catheter was clogged. He states that his home health nurse attempted to unclog it without success. They tried irrigating it several times without success. He reports discomfort in his lower abdomen as well as distention since that time.      Past Medical History:  Diagnosis Date  . Clostridium difficile infection    2015  . Coronary artery disease involving native coronary artery 12/1989   Unstable Angina  . Hearing difficulty   . High cholesterol   . Hypertension   . Memory difficulty   . Myocardial infarction ([place])   . Prostate cancer ([place])   . Prostate cancer ([place])   . S/P CABG x 3 12/27/1989   LIMA-D1-LAD, SVG-rPDA (PDA Endarterectomy & patch andioplasty)   . Toenail fungus   . Urinary (tract) obstruction   . UTI (urinary tract infection)     Patient Active Problem List   Diagnosis Date Noted  . Memory loss 01/20/2015  . Urinary retention 01/20/2015  . Gait disturbance 01/20/2015  . Vitamin D deficiency 01/20/2015  . Hematuria 12/21/2014  . Pressure ulcer 12/21/2014  . Hyperlipidemia 07/07/2014  . Enteritis due to Clostridium difficile 06/29/2014  . Clostridial gastroenteritis 06/28/2014  . Acute urinary retention   . Other specified hypotension   . Non Q wave myocardial infarction  ([place]) 06/23/2014  . Acute renal failure ([place]) 06/23/2014  . Acute encephalopathy 06/23/2014  . Essential hypertension 06/23/2014  . Non-ST elevation (NSTEMI) myocardial infarction ([place])   . CAD - s/p CABP 12/1989; LIMA-D1-LAD, SVG-rPDA (PDA Endarterectomy & patch andioplasty) 12/27/1989    Past Surgical History:  Procedure Laterality Date  . APPENDECTOMY    . CORONARY ARTERY BYPASS GRAFT    . CYSTOSCOPY WITH URETHRAL DILATATION N/A 12/21/2014   Procedure: CYSTOSCOPY WITH RETROGRADE AND URETHRAL DILATATION ;  Surgeon: Alexis Frock, MD;  Location: [facility] ORS;  Service: Urology;  Laterality: N/A;  . LEFT HEART CATHETERIZATION WITH CORONARY/GRAFT ANGIOGRAM N/A 06/25/2014   Procedure: LEFT HEART CATHETERIZATION WITH Beatrix Fetters;  Surgeon: Leonie Man, MD;  Location: [facility] CATH LAB;  Service: Cardiovascular;  Laterality: N/A;  . MULTIPLE TOOTH EXTRACTIONS Bilateral        Home Medications    Prior to Admission medications   Medication Sig Start Date End Date Taking? Authorizing Provider  aspirin 81 MG tablet Take 81 mg by mouth daily.   Yes Historical Provider, MD  atorvastatin (LIPITOR) 40 MG tablet Take 40 mg by mouth daily.   Yes Historical Provider, MD  Cholecalciferol (VITAMIN D-3 PO) Take 1,000 Units by mouth daily.    Yes Historical Provider, MD  Cyanocobalamin (VITAMIN B-12 PO) Take 1,000 mcg by mouth daily.    Yes Historical Provider, MD  donepezil (ARICEPT) 5 MG tablet Take 1 tablet (  5 mg total) by mouth at bedtime. 10/27/15  Yes Britt Bottom, MD  finasteride (PROSCAR) 5 MG tablet Take 5 mg by mouth daily.   Yes Historical Provider, MD  losartan (COZAAR) 100 MG tablet Take 100 mg by mouth daily.   Yes Historical Provider, MD  Melatonin 10 MG TABS Take 10 mg by mouth at bedtime.    Yes Historical Provider, MD  metoprolol tartrate (LOPRESSOR) 25 MG tablet Take 12.5 mg by mouth 2 (two) times daily.    Yes Historical Provider, MD  Omega-3 Fatty Acids (FISH OIL) 1200 MG  CAPS Take 1,200 mg by mouth daily.   Yes Historical Provider, MD  Probiotic Product (PROBIOTIC & ACIDOPHILUS EX ST PO) Take 1 tablet by mouth 2 (two) times daily.    Yes Historical Provider, MD  sulfamethoxazole-trimethoprim (BACTRIM DS,SEPTRA DS) 800-160 MG tablet Take 1 tablet by mouth 2 (two) times daily. 02/18/16 02/25/16  Harvel Quale, MD    Family History Family History  Problem Relation Age of Onset  . Diabetes Father   . Deep vein thrombosis Father   . Neuropathy Sister   . Heart attack Brother     Social History Social History  Substance Use Topics  . Smoking status: Former Research scientist (life sciences)  . Smokeless tobacco: Not on file  . Alcohol use No     Allergies   Review of patient's allergies indicates no known allergies.   Review of Systems Review of Systems  Constitutional: Negative for chills, fatigue and fever.  HENT: Negative for congestion, rhinorrhea and sinus pressure.   Eyes: Negative for visual disturbance.  Respiratory: Negative for chest tightness and shortness of breath.   Cardiovascular: Negative for chest pain and palpitations.  Gastrointestinal: Positive for abdominal distention (In the lower abdomen) and abdominal pain (lower abdominal discomfort). Negative for constipation, diarrhea and vomiting.  Genitourinary: Positive for difficulty urinating (foley catheter not draining). Negative for flank pain.  Musculoskeletal: Negative for back pain and myalgias.  Neurological: Negative for weakness and headaches.  Hematological: Negative for adenopathy.     Physical Exam Updated Vital Signs BP 136/73 (BP Location: Left Arm)   Pulse 73   Temp 98.1 F (36.7 C) (Oral)   Resp 18   SpO2 96%   Physical Exam  Constitutional: He appears well-developed and well-nourished.  HENT:  Head: Normocephalic and atraumatic.  Eyes: Conjunctivae are normal.  Neck: Neck supple.  Cardiovascular: Normal rate and regular rhythm.   No murmur heard. Pulmonary/Chest: Effort normal  and breath sounds normal. No respiratory distress.  Abdominal: Soft. He exhibits distension (mild in the lower abdomen above the bladder). There is tenderness (mild over the urinary bladder).  Genitourinary:  Genitourinary Comments: Foley catheter in place without any urine in the catheter bag  Musculoskeletal: He exhibits no edema.  Neurological: He is alert.  Skin: Skin is warm and dry.  Psychiatric: He has a normal mood and affect.  Nursing note and vitals reviewed.    ED Treatments / Results  Labs (all labs ordered are listed, but only abnormal results are displayed) Labs Reviewed  URINALYSIS, ROUTINE W REFLEX MICROSCOPIC (NOT AT [facility]) - Abnormal; Notable for the following:       Result Value   APPearance CLOUDY (*)    Hgb urine dipstick LARGE (*)    Leukocytes, UA LARGE (*)    All other components within normal limits  URINE MICROSCOPIC-ADD ON - Abnormal; Notable for the following:    Bacteria, UA MANY (*)  All other components within normal limits  URINE CULTURE    EKG  EKG Interpretation None       Radiology No results found.  Procedures Procedures (including critical care time)  Medications Ordered in ED Medications - No data to display   Initial Impression / Assessment and Plan / ED Course  I have reviewed the triage vital signs and the nursing notes.  Pertinent labs & imaging results that were available during my care of the patient were reviewed by me and considered in my medical decision making (see chart for details).  Clinical Course    Patient was seen and evaluated in stable condition. Foley catheter clogged and not able to be irrigated or flushed. Catheter exchanged without difficulty. On reevaluation urine draining through Foley catheter without difficulty. Patient's abdominal examination improved. No distention and no discomfort. Urine concerning for infection. Culture sent. Patient will be discharged with a prescription for Bactrim per  antibiotic request from his daughter. Strict return precautions given. Patient and caregiver at bedside expressed understanding and agreement with plan of care.  Final Clinical Impressions(s) / ED Diagnoses   Final diagnoses:  UTI (lower urinary tract infection)  Foley catheter problem, initial encounter ([place])    New Prescriptions New Prescriptions   SULFAMETHOXAZOLE-TRIMETHOPRIM (BACTRIM DS,SEPTRA DS) 800-160 MG TABLET    Take 1 tablet by mouth 2 (two) times daily.     Harvel Quale, MD 02/18/16 [phone]

## 2016-02-18 NOTE — Discharge Instructions (Signed)
You were seen and evaluated today for your catheter being clogged.  Your urine looks infected.  Please, take the antibiotic prescribed.  Follow up outpatient with your primary care physician for recheck. Return for fever, nausea and vomiting, back pain.

## 2016-02-19 LAB — URINE CULTURE

## 2016-03-12 ENCOUNTER — Ambulatory Visit (INDEPENDENT_AMBULATORY_CARE_PROVIDER_SITE_OTHER): Payer: Medicare Other | Admitting: Podiatry

## 2016-03-12 ENCOUNTER — Encounter: Payer: Self-pay | Admitting: Podiatry

## 2016-03-12 DIAGNOSIS — M79676 Pain in unspecified toe(s): Secondary | ICD-10-CM

## 2016-03-12 DIAGNOSIS — B351 Tinea unguium: Secondary | ICD-10-CM

## 2016-03-12 NOTE — Progress Notes (Signed)
Patient ID: Henry Curtis, male   DOB: [birth year], 82 y.o.   MRN: [id number]  Subjective: 82 y.o. returns the office today for painful, elongated, thickened toenails which he is unable to trim himself. Denies any redness or drainage around the nails. Denies any acute changes since last appointment and no new complaints today. Denies any systemic complaints such as fevers, chills, nausea, vomiting.   Objective: AAO 3, NAD DP/PT pulses palpable 1/4, CRT less than 3 seconds Nails hypertrophic, dystrophic, elongated, brittle, discolored 10. There is tenderness overlying the nails 1-5 bilaterally. There is no surrounding erythema or drainage along the nail sites. Bilateral hallux nails are elongated and digging into the distal part of the toe. No open sore today.  No open lesions or pre-ulcerative lesions are identified. No other areas of tenderness bilateral lower extremities. No overlying edema, erythema, increased warmth. No pain with calf compression, swelling, warmth, erythema.  Assessment: Patient presents with symptomatic onychomycosis  Plan: -Treatment options including alternatives, risks, complications were discussed -Nails sharply debrided 10 without complication/bleeding. -Discussed daily foot inspection. If there are any changes, to call the office immediately.  -Follow-up in 9 weeks or sooner if any problems are to arise. In the meantime, encouraged to call the office with any questions, concerns, changes symptoms. Will seem him at 9 weeks as the hallux nails are causing pressure to the distal portions of the toes.   Celesta Gentile, DPM

## 2016-03-26 DIAGNOSIS — Z466 Encounter for fitting and adjustment of urinary device: Secondary | ICD-10-CM | POA: Diagnosis not present

## 2016-03-26 DIAGNOSIS — F0390 Unspecified dementia without behavioral disturbance: Secondary | ICD-10-CM | POA: Diagnosis not present

## 2016-03-26 DIAGNOSIS — N359 Urethral stricture, unspecified: Secondary | ICD-10-CM | POA: Diagnosis not present

## 2016-03-26 DIAGNOSIS — I10 Essential (primary) hypertension: Secondary | ICD-10-CM | POA: Diagnosis not present

## 2016-03-26 DIAGNOSIS — R330 Drug induced retention of urine: Secondary | ICD-10-CM | POA: Diagnosis not present

## 2016-03-26 DIAGNOSIS — N401 Enlarged prostate with lower urinary tract symptoms: Secondary | ICD-10-CM | POA: Diagnosis not present

## 2016-04-19 DIAGNOSIS — R3914 Feeling of incomplete bladder emptying: Secondary | ICD-10-CM | POA: Diagnosis not present

## 2016-05-14 DIAGNOSIS — R9721 Rising PSA following treatment for malignant neoplasm of prostate: Secondary | ICD-10-CM | POA: Diagnosis not present

## 2016-05-14 DIAGNOSIS — C61 Malignant neoplasm of prostate: Secondary | ICD-10-CM | POA: Diagnosis not present

## 2016-05-14 DIAGNOSIS — R3914 Feeling of incomplete bladder emptying: Secondary | ICD-10-CM | POA: Diagnosis not present

## 2016-05-21 ENCOUNTER — Ambulatory Visit: Payer: Medicare Other | Admitting: Podiatry

## 2016-05-24 DIAGNOSIS — H5203 Hypermetropia, bilateral: Secondary | ICD-10-CM | POA: Diagnosis not present

## 2016-05-28 ENCOUNTER — Encounter: Payer: Self-pay | Admitting: Podiatry

## 2016-05-28 ENCOUNTER — Ambulatory Visit (INDEPENDENT_AMBULATORY_CARE_PROVIDER_SITE_OTHER): Payer: Medicare Other | Admitting: Podiatry

## 2016-05-28 DIAGNOSIS — B351 Tinea unguium: Secondary | ICD-10-CM

## 2016-05-28 DIAGNOSIS — L608 Other nail disorders: Secondary | ICD-10-CM

## 2016-05-28 DIAGNOSIS — M79609 Pain in unspecified limb: Secondary | ICD-10-CM

## 2016-05-28 DIAGNOSIS — L603 Nail dystrophy: Secondary | ICD-10-CM

## 2016-05-28 NOTE — Progress Notes (Signed)
SUBJECTIVE Patient  presents to office today complaining of elongated, thickened nails. Pain while ambulating in shoes. Patient is unable to trim their own nails.   OBJECTIVE General Patient is awake, alert, and oriented x 3 and in no acute distress. Derm Skin is dry and supple bilateral. Negative open lesions or macerations. Remaining integument unremarkable. Nails are tender, long, thickened and dystrophic with subungual debris, consistent with onychomycosis, 1-5 bilateral. No signs of infection noted. Vasc  DP and PT pedal pulses palpable bilaterally. Temperature gradient within normal limits.  Neuro Epicritic and protective threshold sensation diminished bilaterally.  Musculoskeletal Exam No symptomatic pedal deformities noted bilateral. Muscular strength within normal limits.  ASSESSMENT 1. Onychodystrophic nails 1-5 bilateral with hyperkeratosis of nails.  2. Onychomycosis of nail due to dermatophyte bilateral 3. Pain in foot bilateral  PLAN OF CARE 1. Patient evaluated today.  2. Instructed to maintain good pedal hygiene and foot care.  3. Mechanical debridement of nails 1-5 bilaterally performed using a nail nipper. Filed with dremel without incident.  4. Return to clinic in 3 mos.    Nekeya Briski M Cambri Plourde, DPM    

## 2016-05-31 DIAGNOSIS — R338 Other retention of urine: Secondary | ICD-10-CM | POA: Diagnosis not present

## 2016-05-31 DIAGNOSIS — Z466 Encounter for fitting and adjustment of urinary device: Secondary | ICD-10-CM | POA: Diagnosis not present

## 2016-05-31 DIAGNOSIS — I10 Essential (primary) hypertension: Secondary | ICD-10-CM | POA: Diagnosis not present

## 2016-05-31 DIAGNOSIS — F0390 Unspecified dementia without behavioral disturbance: Secondary | ICD-10-CM | POA: Diagnosis not present

## 2016-05-31 DIAGNOSIS — N401 Enlarged prostate with lower urinary tract symptoms: Secondary | ICD-10-CM | POA: Diagnosis not present

## 2016-05-31 DIAGNOSIS — N359 Urethral stricture, unspecified: Secondary | ICD-10-CM | POA: Diagnosis not present

## 2016-07-17 DIAGNOSIS — F0390 Unspecified dementia without behavioral disturbance: Secondary | ICD-10-CM | POA: Diagnosis not present

## 2016-07-17 DIAGNOSIS — Z466 Encounter for fitting and adjustment of urinary device: Secondary | ICD-10-CM | POA: Diagnosis not present

## 2016-07-17 DIAGNOSIS — R338 Other retention of urine: Secondary | ICD-10-CM | POA: Diagnosis not present

## 2016-07-17 DIAGNOSIS — N359 Urethral stricture, unspecified: Secondary | ICD-10-CM | POA: Diagnosis not present

## 2016-07-17 DIAGNOSIS — I10 Essential (primary) hypertension: Secondary | ICD-10-CM | POA: Diagnosis not present

## 2016-07-17 DIAGNOSIS — N401 Enlarged prostate with lower urinary tract symptoms: Secondary | ICD-10-CM | POA: Diagnosis not present

## 2016-07-17 NOTE — Progress Notes (Deleted)
HPI: follow-up coronary artery disease. Patient has had previous coronary artery bypass and graft in 1991 with a LIMA to the diagonal and LAD, saphenous vein graft to the PDA.Previously admitted with increased confusion and fatigue. Troponins were abnormal. Echocardiogram December 2015 showed an ejection fraction of 45-50% and moderate mitral regurgitation. Cardiac catheterization December 2015 showed an occluded LAD. There was a 95% lesion in the LAD distal to the LIMA insertion. There was also another 95% lesion distal to that. There are extensive collaterals to the distal LAD. The circumflex was occluded. There were bridging collaterals into the second and third marginal. The right coronary was occluded as was the saphenous vein graft to the right coronary artery. Patient had PCI of the distal LAD with drug-eluting stent and PTCA. Since last seen,   Current Outpatient Prescriptions  Medication Sig Dispense Refill  . aspirin 81 MG tablet Take 81 mg by mouth daily.    Marland Kitchen atorvastatin (LIPITOR) 40 MG tablet Take 40 mg by mouth daily.    . Cholecalciferol (VITAMIN D-3 PO) Take 1,000 Units by mouth daily.     . Cyanocobalamin (VITAMIN B-12 PO) Take 1,000 mcg by mouth daily.     Marland Kitchen donepezil (ARICEPT) 5 MG tablet Take 1 tablet (5 mg total) by mouth at bedtime. 30 tablet 11  . finasteride (PROSCAR) 5 MG tablet Take 5 mg by mouth daily.    Marland Kitchen losartan (COZAAR) 100 MG tablet Take 100 mg by mouth daily.    . Melatonin 10 MG TABS Take 10 mg by mouth at bedtime.     . metoprolol tartrate (LOPRESSOR) 25 MG tablet Take 12.5 mg by mouth 2 (two) times daily.     . Omega-3 Fatty Acids (FISH OIL) 1200 MG CAPS Take 1,200 mg by mouth daily.    . Probiotic Product (PROBIOTIC & ACIDOPHILUS EX ST PO) Take 1 tablet by mouth 2 (two) times daily.      No current facility-administered medications for this visit.      Past Medical History:  Diagnosis Date  . Clostridium difficile infection    2015  . Coronary  artery disease involving native coronary artery 12/1989   Unstable Angina  . Hearing difficulty   . High cholesterol   . Hypertension   . Memory difficulty   . Myocardial infarction   . Prostate cancer ([place])   . Prostate cancer ([place])   . S/P CABG x 3 12/27/1989   LIMA-D1-LAD, SVG-rPDA (PDA Endarterectomy & patch andioplasty)   . Toenail fungus   . Urinary (tract) obstruction   . UTI (urinary tract infection)     Past Surgical History:  Procedure Laterality Date  . APPENDECTOMY    . CORONARY ARTERY BYPASS GRAFT    . CYSTOSCOPY WITH URETHRAL DILATATION N/A 12/21/2014   Procedure: CYSTOSCOPY WITH RETROGRADE AND URETHRAL DILATATION ;  Surgeon: Alexis Frock, MD;  Location: [facility];  Service: Urology;  Laterality: N/A;  . LEFT HEART CATHETERIZATION WITH CORONARY/GRAFT ANGIOGRAM N/A 06/25/2014   Procedure: LEFT HEART CATHETERIZATION WITH Beatrix Fetters;  Surgeon: Leonie Man, MD;  Location: [facility] CATH LAB;  Service: Cardiovascular;  Laterality: N/A;  . MULTIPLE TOOTH EXTRACTIONS Bilateral     Social History   Social History  . Marital status: Widowed    Spouse name: N/A  . Number of children: N/A  . Years of education: N/A   Occupational History  . Not on file.   Social History Main Topics  . Smoking status: Former  Smoker  . Smokeless tobacco: Not on file  . Alcohol use No  . Drug use: No  . Sexual activity: Not on file   Other Topics Concern  . Not on file   Social History Narrative   Diet heart healthy - sweets in moderation       Do you drink/ eat things with caffeine? No       Marital status: Widowed Twice                              What year were you married ?      Do you live in a house, apartment,assistred living, condo, trailer, etc.)? Home      Is it one or more stories? 2 stories      How many persons live in your home ? 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  [employer]                     If not, do you want to discuss one?       Do you have signed POA?HPOA forms?  Yes               If so, please bring to your        appointment          Family History  Problem Relation Age of Onset  . Diabetes Father   . Deep vein thrombosis Father   . Neuropathy Sister   . Heart attack Brother     ROS: no fevers or chills, productive cough, hemoptysis, dysphasia, odynophagia, melena, hematochezia, dysuria, hematuria, rash, seizure activity, orthopnea, PND, pedal edema, claudication. Remaining systems are negative.  Physical Exam: Well-developed well-nourished in no acute distress.  Skin is warm and dry.  HEENT is normal.  Neck is supple.  Chest is clear to auscultation with normal expansion.  Cardiovascular exam is regular rate and rhythm.  Abdominal exam nontender or distended. No masses palpated. Extremities show no edema. neuro grossly intact  ECG

## 2016-07-26 ENCOUNTER — Ambulatory Visit: Payer: Medicare Other | Admitting: Cardiology

## 2016-08-16 NOTE — Progress Notes (Signed)
HPI: follow-up coronary artery disease. Patient has had previous coronary artery bypass and graft in 1991 with a LIMA to the diagonal and LAD, saphenous vein graft to the PDA. Echocardiogram December 2015 showed an ejection fraction of 45-50% and moderate mitral regurgitation. Cardiac catheterization December 2015 showed an occluded LAD. There was a 95% lesion in the LAD distal to the LIMA insertion. There was also another 95% lesion distal to that. There are extensive collaterals to the distal LAD. The circumflex was occluded. There were bridging collaterals into the second and third marginal. The right coronary was occluded as was the saphenous vein graft to the right coronary artery. Patient had PCI of the distal LAD with drug-eluting stent and PTCA. Since last seen, patient denies chest pain, dyspnea, palpitations or syncope.  Current Outpatient Prescriptions  Medication Sig Dispense Refill  . aspirin 81 MG tablet Take 81 mg by mouth daily.    Marland Kitchen atorvastatin (LIPITOR) 40 MG tablet Take 40 mg by mouth daily.    . Cholecalciferol (VITAMIN D-3 PO) Take 1,000 Units by mouth daily.     . Cyanocobalamin (VITAMIN B-12 PO) Take 1,000 mcg by mouth daily.     Marland Kitchen donepezil (ARICEPT) 5 MG tablet Take 1 tablet (5 mg total) by mouth at bedtime. 30 tablet 11  . finasteride (PROSCAR) 5 MG tablet Take 5 mg by mouth daily.    Marland Kitchen losartan (COZAAR) 100 MG tablet Take 100 mg by mouth daily.    . Melatonin 10 MG TABS Take 10 mg by mouth at bedtime.     . metoprolol tartrate (LOPRESSOR) 25 MG tablet Take 12.5 mg by mouth 2 (two) times daily.     . Omega-3 Fatty Acids (FISH OIL) 1200 MG CAPS Take 1,200 mg by mouth daily.    . Probiotic Product (PROBIOTIC & ACIDOPHILUS EX ST PO) Take 1 tablet by mouth 2 (two) times daily.      No current facility-administered medications for this visit.      Past Medical History:  Diagnosis Date  . Clostridium difficile infection    2015  . Coronary artery disease  involving native coronary artery 12/1989   Unstable Angina  . Hearing difficulty   . High cholesterol   . Hypertension   . Memory difficulty   . Myocardial infarction   . Prostate cancer ([place])   . Prostate cancer ([place])   . S/P CABG x 3 12/27/1989   LIMA-D1-LAD, SVG-rPDA (PDA Endarterectomy & patch andioplasty)   . Toenail fungus   . Urinary (tract) obstruction   . UTI (urinary tract infection)     Past Surgical History:  Procedure Laterality Date  . APPENDECTOMY    . CORONARY ARTERY BYPASS GRAFT    . CYSTOSCOPY WITH URETHRAL DILATATION N/A 12/21/2014   Procedure: CYSTOSCOPY WITH RETROGRADE AND URETHRAL DILATATION ;  Surgeon: Alexis Frock, MD;  Location: [facility];  Service: Urology;  Laterality: N/A;  . LEFT HEART CATHETERIZATION WITH CORONARY/GRAFT ANGIOGRAM N/A 06/25/2014   Procedure: LEFT HEART CATHETERIZATION WITH Beatrix Fetters;  Surgeon: Leonie Man, MD;  Location: [facility] CATH LAB;  Service: Cardiovascular;  Laterality: N/A;  . MULTIPLE TOOTH EXTRACTIONS Bilateral     Social History   Social History  . Marital status: Widowed    Spouse name: N/A  . Number of children: N/A  . Years of education: N/A   Occupational History  . Not on file.   Social History Main Topics  . Smoking status: Former Research scientist (life sciences)  .  Smokeless tobacco: Not on file  . Alcohol use No  . Drug use: No  . Sexual activity: Not on file   Other Topics Concern  . Not on file   Social History Narrative   Diet heart healthy - sweets in moderation       Do you drink/ eat things with caffeine? No       Marital status: Widowed Twice                              What year were you married ?      Do you live in a house, apartment,assistred living, condo, trailer, etc.)? Home      Is it one or more stories? 2 stories      How many persons live in your home ? 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   A little walking                           Type & how often: not often      Do you have a living will?Yes      Do you have a DNR form?  [employer]                     If not, do you want to discuss one?       Do you have signed POA?HPOA forms?  Yes               If so, please bring to your        appointment          Family History  Problem Relation Age of Onset  . Diabetes Father   . Deep vein thrombosis Father   . Neuropathy Sister   . Heart attack Brother     ROS: no fevers or chills, productive cough, hemoptysis, dysphasia, odynophagia, melena, hematochezia, dysuria, hematuria, rash, seizure activity, orthopnea, PND, pedal edema, claudication. Remaining systems are negative.  Physical Exam: Well-developed frail in no acute distress.  Skin is warm and dry.  HEENT is normal.  Neck is supple.  Chest is clear to auscultation with normal expansion.  Cardiovascular exam is regular rate and rhythm. 2/6 systolic murmur left sternal border. Abdominal exam nontender or distended. No masses palpated. Extremities show trace edema. neuro grossly intact  ECG-Sinus rhythm with first-degree AV block and occasional PAC. Normal axis. Nonspecific T-wave changes. Cannot rule out prior inferior infarct.  A/P  1 Coronary artery disease-continue aspirin and statin.  2 hyperlipidemia-continue statin. Lipids and liver monitored by primary care.   3 hypertension-blood pressure controlled. Continue present medications. Renal function monitored by primary care.   Kirk Ruths, MD

## 2016-08-24 ENCOUNTER — Encounter: Payer: Self-pay | Admitting: Cardiology

## 2016-08-24 ENCOUNTER — Ambulatory Visit (INDEPENDENT_AMBULATORY_CARE_PROVIDER_SITE_OTHER): Payer: Medicare Other | Admitting: Cardiology

## 2016-08-24 VITALS — BP 132/56 | HR 62 | Ht 70.0 in | Wt 175.0 lb

## 2016-08-24 DIAGNOSIS — I10 Essential (primary) hypertension: Secondary | ICD-10-CM

## 2016-08-24 DIAGNOSIS — I2510 Atherosclerotic heart disease of native coronary artery without angina pectoris: Secondary | ICD-10-CM | POA: Diagnosis not present

## 2016-08-24 DIAGNOSIS — I252 Old myocardial infarction: Secondary | ICD-10-CM

## 2016-08-24 DIAGNOSIS — E7800 Pure hypercholesterolemia, unspecified: Secondary | ICD-10-CM

## 2016-08-24 NOTE — Patient Instructions (Signed)
Your physician wants you to follow-up in: ONE YEAR WITH DR CRENSHAW You will receive a reminder letter in the mail two months in advance. If you don't receive a letter, please call our office to schedule the follow-up appointment.   If you need a refill on your cardiac medications before your next appointment, please call your pharmacy.  

## 2016-08-27 ENCOUNTER — Ambulatory Visit (INDEPENDENT_AMBULATORY_CARE_PROVIDER_SITE_OTHER): Payer: Medicare Other | Admitting: Podiatry

## 2016-08-27 ENCOUNTER — Encounter: Payer: Self-pay | Admitting: Podiatry

## 2016-08-27 VITALS — BP 126/78 | HR 60 | Resp 18

## 2016-08-27 DIAGNOSIS — M79609 Pain in unspecified limb: Secondary | ICD-10-CM

## 2016-08-27 DIAGNOSIS — B351 Tinea unguium: Secondary | ICD-10-CM

## 2016-08-27 NOTE — Progress Notes (Signed)
Patient ID: Henry Curtis, male   DOB: 12/09/1921, 83 y.o.   MRN: [id number]  Subjective: 83 y.o. returns the office today for painful, elongated, thickened toenails which he is unable to trim himself. Denies any redness or drainage around the nails. Denies any acute changes since last appointment and no new complaints today. Denies any systemic complaints such as fevers, chills, nausea, vomiting.   Objective: AAO 3, NAD DP/PT pulses palpable 1/4, CRT less than 3 seconds Nails hypertrophic, dystrophic, elongated, brittle, discolored 10. There is tenderness overlying the nails 1-5 bilaterally. There is no surrounding erythema or drainage along the nail sites.  No open sorse today.  No open lesions or pre-ulcerative lesions are identified. No other areas of tenderness bilateral lower extremities. No overlying edema, erythema, increased warmth. No pain with calf compression, swelling, warmth, erythema.  Assessment: Patient presents with symptomatic onychomycosis  Plan: -Treatment options including alternatives, risks, complications were discussed -Nails sharply debrided 10 without complication/bleeding. -Discussed daily foot inspection. If there are any changes, to call the office immediately.  -Follow-up in 3 months or sooner if any problems are to arise. In the meantime, encouraged to call the office with any questions, concerns, changes symptoms. Will seem him at 9 weeks as the hallux nails are causing pressure to the distal portions of the toes.   Keshawn Sundberg, DPM  

## 2016-09-24 DIAGNOSIS — N401 Enlarged prostate with lower urinary tract symptoms: Secondary | ICD-10-CM | POA: Diagnosis not present

## 2016-09-24 DIAGNOSIS — N320 Bladder-neck obstruction: Secondary | ICD-10-CM | POA: Diagnosis not present

## 2016-09-24 DIAGNOSIS — R9721 Rising PSA following treatment for malignant neoplasm of prostate: Secondary | ICD-10-CM | POA: Diagnosis not present

## 2016-09-24 DIAGNOSIS — C61 Malignant neoplasm of prostate: Secondary | ICD-10-CM | POA: Diagnosis not present

## 2016-10-25 ENCOUNTER — Encounter: Payer: Self-pay | Admitting: Neurology

## 2016-10-25 ENCOUNTER — Encounter (INDEPENDENT_AMBULATORY_CARE_PROVIDER_SITE_OTHER): Payer: Self-pay

## 2016-10-25 ENCOUNTER — Telehealth: Payer: Self-pay | Admitting: Neurology

## 2016-10-25 ENCOUNTER — Ambulatory Visit (INDEPENDENT_AMBULATORY_CARE_PROVIDER_SITE_OTHER): Payer: Medicare Other | Admitting: Neurology

## 2016-10-25 VITALS — BP 124/46 | HR 58 | Resp 20 | Ht 69.0 in | Wt 179.0 lb

## 2016-10-25 DIAGNOSIS — R339 Retention of urine, unspecified: Secondary | ICD-10-CM | POA: Diagnosis not present

## 2016-10-25 DIAGNOSIS — F0280 Dementia in other diseases classified elsewhere without behavioral disturbance: Secondary | ICD-10-CM

## 2016-10-25 DIAGNOSIS — R269 Unspecified abnormalities of gait and mobility: Secondary | ICD-10-CM

## 2016-10-25 DIAGNOSIS — R413 Other amnesia: Secondary | ICD-10-CM

## 2016-10-25 DIAGNOSIS — G301 Alzheimer's disease with late onset: Secondary | ICD-10-CM

## 2016-10-25 DIAGNOSIS — G309 Alzheimer's disease, unspecified: Secondary | ICD-10-CM

## 2016-10-25 MED ORDER — DONEPEZIL HCL 10 MG PO TABS: 10.0000 mg | ORAL_TABLET | Freq: Every day | ORAL | 3 refills | Status: DC

## 2016-10-25 MED ORDER — DONEPEZIL HCL 10 MG PO TABS: 10.0000 mg | ORAL_TABLET | Freq: Every day | ORAL | 5 refills | Status: AC

## 2016-10-25 MED ORDER — DONEPEZIL HCL 10 MG PO TABS: 10.0000 mg | ORAL_TABLET | Freq: Every day | ORAL | 5 refills | Status: DC

## 2016-10-25 NOTE — Progress Notes (Signed)
[facility]  PATIENT: Henry Curtis DOB: [birth year]  REFERRING DOCTOR OR PCP:  Deland Pretty SOURCE: patient and records from Dr. Shelia Media  _________________________________   HISTORICAL  CHIEF COMPLAINT:  Chief Complaint  Patient presents with  . Follow-up    memory loss, thinks its getting worse, MMSE 18/30, CDT 3/4, AFT 5    HISTORY OF PRESENT ILLNESS:  Henry Curtis is a 83 year old right-handed man with memory and gait disturbances.      Memory:    He is mostly the same with the short-term memory functions since starting Aricept.    He has had some issues with short-term memory for about 3 years now. Cognitive issues worsened further after a hospital stay in December 2015.   Today, the Mini-Mental Status exam is 18/30.   He stopped driving after that hospitalization.  [place] 12/2014 was 17/30 2017, he was 18/30.   He was placed on Namenda earlier in the year ago but during the fourth week, he had hematuria and it was stopped. He also seemed to have more confusion intermittently with it, however.Marland Kitchen He is tolerating the 5 mg dose of Aricept well and we discussed increasing the dose to 10 mg.  CT shows age related atrophy and small vessel changes.   He sleeps well at night.     There are no behavioral issues. He has not had any agitation since his hospital stay December 2015.   MMSE - Mini Mental State Exam 10/25/2016 10/25/2016  Orientation to time 1 1  Orientation to Place 3 3  Registration 3 3  Attention/ Calculation 2 2  Recall 2 2  Language- name 2 objects 2 2  Language- repeat 0 0  Language- repeat-comments - hard of hearing  Language- follow 3 step command 2 2  Language- read & follow direction 1 1  Write a sentence 1 1  Copy design 1 1  Total score 18 18    Montreal Cognitive Assessment  10/27/2015  Visuospatial/ Executive (0/5) 4  Naming (0/3) 3  Attention: Read list of digits (0/2) 2  Attention: Read list of letters (0/1) 1  Attention: Serial 7  subtraction starting at 100 (0/3) 3  Language: Repeat phrase (0/2) 2  Language : Fluency (0/1) 0  Abstraction (0/2) 0  Delayed Recall (0/5) 1  Orientation (0/6) 2  Total 18    Gait:  Gait has progressively worsened over the past decade, especially the last 2-3 years. There is some shuffling. He has not had any falls.   He  now will use a cane every time he is up.    He used it intermittently since 2009.     No recent falls.  Tremor:   He has a mild intention tremor with writing.  Less tremor with eating.      Bladder:      Since his hospital stay for his cardiac catheterization (06/2014) he had more issues with his bladder and required an indwelling catheter and it is changed every 3 weeks (home nurse).    He sees a urologist.    Of note, he had prostate cancer with radioactive seeds and he is felt to possibly have scarring.     REVIEW OF SYSTEMS: Constitutional: No fevers, chills, sweats, or change in appetite Eyes: No visual changes, double vision, eye pain Ear, nose and throat: No hearing loss, ear pain, nasal congestion, sore throat Cardiovascular: recent MI in 12/15.   No current CP.   Respiratory: No shortness  of breath at rest or with exertion.   No wheezes GastrointestinaI: No nausea, vomiting, diarrhea, abdominal pain, fecal incontinence Genitourinary: see above Musculoskeletal: No neck pain, back pain Integumentary: No rash, pruritus, skin lesions Neurological: as above Psychiatric: No depression at this time.  No anxiety Endocrine: No palpitations, diaphoresis, change in appetite, change in weigh or increased thirst Hematologic/Lymphatic: No anemia, purpura, petechiae. Allergic/Immunologic: No itchy/runny eyes, nasal congestion, recent allergic reactions, rashes  ALLERGIES: No Known Allergies  HOME MEDICATIONS:  Current Outpatient Prescriptions:  .  aspirin 81 MG tablet, Take 81 mg by mouth daily., Disp: , Rfl:  .  atorvastatin (LIPITOR) 40 MG tablet, Take 40 mg  by mouth daily., Disp: , Rfl:  .  Cholecalciferol (VITAMIN D-3 PO), Take 1,000 Units by mouth daily. , Disp: , Rfl:  .  Cyanocobalamin (VITAMIN B-12 PO), Take 1,000 mcg by mouth daily. , Disp: , Rfl:  .  finasteride (PROSCAR) 5 MG tablet, Take 5 mg by mouth daily., Disp: , Rfl:  .  losartan (COZAAR) 100 MG tablet, Take 100 mg by mouth daily., Disp: , Rfl:  .  Melatonin 10 MG TABS, Take 10 mg by mouth at bedtime. , Disp: , Rfl:  .  metoprolol tartrate (LOPRESSOR) 25 MG tablet, Take 12.5 mg by mouth 2 (two) times daily. , Disp: , Rfl:  .  Omega-3 Fatty Acids (FISH OIL) 1200 MG CAPS, Take 1,200 mg by mouth daily., Disp: , Rfl:  .  Probiotic Product (PROBIOTIC & ACIDOPHILUS EX ST PO), Take 1 tablet by mouth 2 (two) times daily. , Disp: , Rfl:  .  donepezil (ARICEPT) 10 MG tablet, Take 1 tablet (10 mg total) by mouth at bedtime., Disp: 30 tablet, Rfl: 5  PAST MEDICAL HISTORY: Past Medical History:  Diagnosis Date  . Clostridium difficile infection    2015  . Coronary artery disease involving native coronary artery 12/1989   Unstable Angina  . Hearing difficulty   . High cholesterol   . Hypertension   . Memory difficulty   . Myocardial infarction   . Prostate cancer ([place])   . Prostate cancer ([place])   . S/P CABG x 3 12/27/1989   LIMA-D1-LAD, SVG-rPDA (PDA Endarterectomy & patch andioplasty)   . Toenail fungus   . Urinary (tract) obstruction   . UTI (urinary tract infection)     PAST SURGICAL HISTORY: Past Surgical History:  Procedure Laterality Date  . APPENDECTOMY    . CORONARY ARTERY BYPASS GRAFT    . CYSTOSCOPY WITH URETHRAL DILATATION N/A 12/21/2014   Procedure: CYSTOSCOPY WITH RETROGRADE AND URETHRAL DILATATION ;  Surgeon: Alexis Frock, MD;  Location: [facility];  Service: Urology;  Laterality: N/A;  . LEFT HEART CATHETERIZATION WITH CORONARY/GRAFT ANGIOGRAM N/A 06/25/2014   Procedure: LEFT HEART CATHETERIZATION WITH Beatrix Fetters;  Surgeon: Leonie Man, MD;  Location: [facility]  CATH LAB;  Service: Cardiovascular;  Laterality: N/A;  . MULTIPLE TOOTH EXTRACTIONS Bilateral     FAMILY HISTORY: Family History  Problem Relation Age of Onset  . Diabetes Father   . Deep vein thrombosis Father   . Neuropathy Sister   . Heart attack Brother     SOCIAL HISTORY:  Social History   Social History  . Marital status: Widowed    Spouse name: N/A  . Number of children: N/A  . Years of education: N/A   Occupational History  . Not on file.   Social History Main Topics  . Smoking status: Former Research scientist (life sciences)  . Smokeless tobacco: Never  Used  . Alcohol use No  . Drug use: No  . Sexual activity: Not on file   Other Topics Concern  . Not on file   Social History Narrative   Diet heart healthy - sweets in moderation       Do you drink/ eat things with caffeine? No       Marital status: Widowed Twice                              What year were you married ?      Do you live in a house, apartment,assistred living, condo, trailer, etc.)? Home      Is it one or more stories? 2 stories      How many persons live in your home ? 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  [place]                     If not, do you want to discuss one?       Do you have signed POA?HPOA forms?  Yes               If so, please bring to your        appointment           PHYSICAL EXAM  Vitals:   10/25/16 1035  BP: (!) 124/46  Pulse: (!) 58  Resp: 20  Weight: 179 lb (81.2 kg)  Height: 5\' 9"  (1.753 m)    Body mass index is 26.43 kg/m.   General: The patient is well-developed and well-nourished and in no acute distress   Neurologic Exam  Mental status:  The patient is alert and not oriented to time.   He has reduced orientation,  attention and recall.   Cranial nerves: Extraocular movements are full.   Facial strength and sensation is normal and symmetric.  Trapezius and sternocleidomastoid strength is normal. No dysarthria is noted.    No obvious hearing deficits are noted.  Motor:  Muscle bulk is normal.   Tone is normal. Strength is  5 / 5 in all 4 extremities.   Sensory: Sensory testing is intact to touch and vibration sensation in all 4 extremities.  Coordination: Cerebellar testing reveals good finger-nose-finger and reduced symmetric heel-to-shin bilaterally.  Gait and station: Station is normal.   Gait is wide with a short stride. He turns 180 in 6 steps (was 5 last visit). He has mild retropulsion. Romberg is negative.   Reflexes: Deep tendon reflexes are symmetric and normal bilaterally.       DIAGNOSTIC DATA (LABS, IMAGING, TESTING) - I reviewed patient records, labs, notes, testing and imaging myself where available.  Lab Results  Component Value Date   WBC 5.8 10/21/2015   HGB 12.3 (L) 10/21/2015   HCT 35.6 (L) 10/21/2015   MCV 92.5 10/21/2015   PLT 158 10/21/2015  Component Value Date/Time   NA 137 10/21/2015 1752   K 4.7 10/21/2015 1752   CL 106 10/21/2015 1752   CO2 27 10/21/2015 1752   GLUCOSE 122 (H) 10/21/2015 1752   BUN 19 10/21/2015 1752   CREATININE 1.04 10/21/2015 1752   CALCIUM 8.9 10/21/2015 1752   PROT 6.6 10/21/2015 1752   ALBUMIN 3.2 (L) 10/21/2015 1752   AST 18 10/21/2015 1752   ALT 14 (L) 10/21/2015 1752   ALKPHOS 50 10/21/2015 1752   BILITOT 0.6 10/21/2015 1752   GFRNONAA >60 10/21/2015 1752   GFRAA >60 10/21/2015 1752   Lab Results  Component Value Date   CHOL 147 06/24/2014   HDL 42 06/24/2014   LDLCALC 94 06/24/2014   TRIG 57 06/24/2014   CHOLHDL 3.5 06/24/2014   Lab Results  Component Value Date   HGBA1C 5.2 06/24/2014   Lab Results  Component Value Date   VITAMINB12 303 01/20/2015   Lab Results  Component Value Date   TSH 2.070 01/20/2015        ASSESSMENT AND PLAN  Gait disturbance  Late onset Alzheimer's disease without behavioral disturbance  Memory loss  Urinary retention  1.  Increase Donepezil to 10 mg.  he was unable to tolerate Namenda in the past.  If he has any GI symptoms we will go back to 5 mg and keep the dose there. 2.   Use a cane when he walks for safety.  3.   Return to see me in 12 months or sooner if there are new or worsening neurologic symptoms.  Dael Howland A. Felecia Shelling, MD, PhD 09/23/3830, 9:19 PM Certified in Neurology, Clinical Neurophysiology, Sleep Medicine, Pain Medicine and Neuroimaging  [facility] [address], [place], [place] [address] [phone]

## 2016-10-25 NOTE — Telephone Encounter (Signed)
Aricept rx. faxed to Dr. Dierdre Searles at [phone], as requested/fim

## 2016-10-25 NOTE — Telephone Encounter (Signed)
Pt's daughter/Kathy called said the [facility] is requesting refill for donepezil (ARICEPT) 10 MG tablet be faxed to: [phone] Attn: Dr Dierdre Searles   Pt last name: [phone] and case note as to why there is a RX change. Please call if there are any questions

## 2016-10-29 ENCOUNTER — Encounter: Payer: Self-pay | Admitting: Podiatry

## 2016-10-29 ENCOUNTER — Ambulatory Visit (INDEPENDENT_AMBULATORY_CARE_PROVIDER_SITE_OTHER): Payer: Medicare Other | Admitting: Podiatry

## 2016-10-29 DIAGNOSIS — M79609 Pain in unspecified limb: Secondary | ICD-10-CM | POA: Diagnosis not present

## 2016-10-29 DIAGNOSIS — B351 Tinea unguium: Secondary | ICD-10-CM | POA: Diagnosis not present

## 2016-10-31 NOTE — Progress Notes (Signed)
Patient ID: Henry Curtis, male   DOB: 07/30/1921, 83 y.o.   MRN: [id number]  Subjective: 83 y.o. returns the office today for painful, elongated, thickened toenails which he is unable to trim himself. Denies any redness or drainage around the nails. Denies any acute changes since last appointment and no new complaints today. Denies any systemic complaints such as fevers, chills, nausea, vomiting.   Objective: AAO 3, NAD DP/PT pulses palpable 1/4, CRT less than 3 seconds Nails hypertrophic, dystrophic, elongated, brittle, discolored 10. There is tenderness overlying the nails 1-5 bilaterally. There is no surrounding erythema or drainage along the nail sites.  No open sorse today.  No open lesions or pre-ulcerative lesions are identified. No other areas of tenderness bilateral lower extremities. No overlying edema, erythema, increased warmth. No pain with calf compression, swelling, warmth, erythema.  Assessment: Patient presents with symptomatic onychomycosis  Plan: -Treatment options including alternatives, risks, complications were discussed -Nails sharply debrided 10 without complication/bleeding. -Discussed daily foot inspection. If there are any changes, to call the office immediately.  -Follow-up in 3 months or sooner if any problems are to arise. In the meantime, encouraged to call the office with any questions, concerns, changes symptoms. Will seem him at 9 weeks as the hallux nails are causing pressure to the distal portions of the toes.   Matthew Wagoner, DPM  

## 2016-11-20 DIAGNOSIS — N401 Enlarged prostate with lower urinary tract symptoms: Secondary | ICD-10-CM | POA: Diagnosis not present

## 2016-11-20 DIAGNOSIS — Z466 Encounter for fitting and adjustment of urinary device: Secondary | ICD-10-CM | POA: Diagnosis not present

## 2016-11-20 DIAGNOSIS — N359 Urethral stricture, unspecified: Secondary | ICD-10-CM | POA: Diagnosis not present

## 2016-11-20 DIAGNOSIS — F0390 Unspecified dementia without behavioral disturbance: Secondary | ICD-10-CM | POA: Diagnosis not present

## 2016-11-20 DIAGNOSIS — I10 Essential (primary) hypertension: Secondary | ICD-10-CM | POA: Diagnosis not present

## 2016-11-20 DIAGNOSIS — R338 Other retention of urine: Secondary | ICD-10-CM | POA: Diagnosis not present

## 2016-12-31 ENCOUNTER — Encounter: Payer: Self-pay | Admitting: Podiatry

## 2016-12-31 ENCOUNTER — Ambulatory Visit (INDEPENDENT_AMBULATORY_CARE_PROVIDER_SITE_OTHER): Payer: Medicare Other | Admitting: Podiatry

## 2016-12-31 DIAGNOSIS — B351 Tinea unguium: Secondary | ICD-10-CM | POA: Diagnosis not present

## 2016-12-31 DIAGNOSIS — M79609 Pain in unspecified limb: Secondary | ICD-10-CM

## 2017-01-02 NOTE — Progress Notes (Signed)
Patient ID: Henry Curtis, male   DOB: 06/01/1922, 83 y.o.   MRN: [id number]  Subjective: 83 y.o. returns the office today for painful, elongated, thickened toenails which he is unable to trim himself. Denies any redness or drainage around the nails. Denies any acute changes since last appointment and no new complaints today. Denies any systemic complaints such as fevers, chills, nausea, vomiting.   Objective: AAO 3, NAD DP/PT pulses palpable 1/4, CRT less than 3 seconds Nails hypertrophic, dystrophic, elongated, brittle, discolored 10. There is tenderness overlying the nails 1-5 bilaterally. There is no surrounding erythema or drainage along the nail sites.  No open sorse today.  No open lesions or pre-ulcerative lesions are identified. No other areas of tenderness bilateral lower extremities. No overlying edema, erythema, increased warmth. No pain with calf compression, swelling, warmth, erythema.  Assessment: Patient presents with symptomatic onychomycosis  Plan: -Treatment options including alternatives, risks, complications were discussed -Nails sharply debrided 10 without complication/bleeding. -Discussed daily foot inspection. If there are any changes, to call the office immediately.  -Follow-up in 3 months or sooner if any problems are to arise. In the meantime, encouraged to call the office with any questions, concerns, changes symptoms. Will seem him at 9 weeks as the hallux nails are causing pressure to the distal portions of the toes.   Matthew Wagoner, DPM  

## 2017-01-22 DIAGNOSIS — R338 Other retention of urine: Secondary | ICD-10-CM | POA: Diagnosis not present

## 2017-01-22 DIAGNOSIS — I10 Essential (primary) hypertension: Secondary | ICD-10-CM | POA: Diagnosis not present

## 2017-01-22 DIAGNOSIS — N359 Urethral stricture, unspecified: Secondary | ICD-10-CM | POA: Diagnosis not present

## 2017-01-22 DIAGNOSIS — F0390 Unspecified dementia without behavioral disturbance: Secondary | ICD-10-CM | POA: Diagnosis not present

## 2017-01-22 DIAGNOSIS — N401 Enlarged prostate with lower urinary tract symptoms: Secondary | ICD-10-CM | POA: Diagnosis not present

## 2017-01-22 DIAGNOSIS — Z466 Encounter for fitting and adjustment of urinary device: Secondary | ICD-10-CM | POA: Diagnosis not present

## 2017-02-11 DIAGNOSIS — E559 Vitamin D deficiency, unspecified: Secondary | ICD-10-CM | POA: Diagnosis not present

## 2017-02-11 DIAGNOSIS — I10 Essential (primary) hypertension: Secondary | ICD-10-CM | POA: Diagnosis not present

## 2017-02-11 DIAGNOSIS — Z0000 Encounter for general adult medical examination without abnormal findings: Secondary | ICD-10-CM | POA: Diagnosis not present

## 2017-02-11 DIAGNOSIS — E7800 Pure hypercholesterolemia, unspecified: Secondary | ICD-10-CM | POA: Diagnosis not present

## 2017-02-13 DIAGNOSIS — C61 Malignant neoplasm of prostate: Secondary | ICD-10-CM | POA: Diagnosis not present

## 2017-02-13 DIAGNOSIS — R9721 Rising PSA following treatment for malignant neoplasm of prostate: Secondary | ICD-10-CM | POA: Diagnosis not present

## 2017-02-14 DIAGNOSIS — N390 Urinary tract infection, site not specified: Secondary | ICD-10-CM | POA: Diagnosis not present

## 2017-02-17 ENCOUNTER — Emergency Department (HOSPITAL_COMMUNITY)
Admission: EM | Admit: 2017-02-17 | Discharge: 2017-02-18 | Disposition: A | Discharge status: Home / Self Care | Payer: Medicare Other | Attending: Emergency Medicine | Admitting: Emergency Medicine

## 2017-02-17 ENCOUNTER — Encounter (HOSPITAL_COMMUNITY): Payer: Self-pay | Admitting: Emergency Medicine

## 2017-02-17 DIAGNOSIS — I10 Essential (primary) hypertension: Secondary | ICD-10-CM | POA: Diagnosis not present

## 2017-02-17 DIAGNOSIS — Z79899 Other long term (current) drug therapy: Secondary | ICD-10-CM | POA: Diagnosis not present

## 2017-02-17 DIAGNOSIS — R109 Unspecified abdominal pain: Secondary | ICD-10-CM

## 2017-02-17 DIAGNOSIS — Z87891 Personal history of nicotine dependence: Secondary | ICD-10-CM | POA: Diagnosis not present

## 2017-02-17 DIAGNOSIS — R319 Hematuria, unspecified: Secondary | ICD-10-CM | POA: Diagnosis not present

## 2017-02-17 DIAGNOSIS — Z7982 Long term (current) use of aspirin: Secondary | ICD-10-CM | POA: Diagnosis not present

## 2017-02-17 DIAGNOSIS — Z951 Presence of aortocoronary bypass graft: Secondary | ICD-10-CM | POA: Insufficient documentation

## 2017-02-17 DIAGNOSIS — G309 Alzheimer's disease, unspecified: Secondary | ICD-10-CM | POA: Diagnosis not present

## 2017-02-17 DIAGNOSIS — I2510 Atherosclerotic heart disease of native coronary artery without angina pectoris: Secondary | ICD-10-CM | POA: Diagnosis not present

## 2017-02-17 DIAGNOSIS — N390 Urinary tract infection, site not specified: Secondary | ICD-10-CM

## 2017-02-17 DIAGNOSIS — Z8546 Personal history of malignant neoplasm of prostate: Secondary | ICD-10-CM | POA: Insufficient documentation

## 2017-02-17 DIAGNOSIS — R1031 Right lower quadrant pain: Secondary | ICD-10-CM | POA: Diagnosis present

## 2017-02-17 NOTE — ED Triage Notes (Addendum)
Pt reports having right sided flank pain that started tonight. Pt has chronic foley catheter with leg bag. Family reports that catheter was changed on 02/12/17

## 2017-02-18 ENCOUNTER — Emergency Department (HOSPITAL_COMMUNITY): Payer: Medicare Other

## 2017-02-18 DIAGNOSIS — R109 Unspecified abdominal pain: Secondary | ICD-10-CM | POA: Diagnosis not present

## 2017-02-18 LAB — URINALYSIS, ROUTINE W REFLEX MICROSCOPIC
Bilirubin Urine: NEGATIVE
Glucose, UA: NEGATIVE mg/dL
Ketones, ur: 5 mg/dL — AB
NITRITE: POSITIVE — AB
PH: 6 (ref 5.0–8.0)
Protein, ur: 30 mg/dL — AB
SPECIFIC GRAVITY, URINE: 1.014 (ref 1.005–1.030)
Squamous Epithelial / LPF: NONE SEEN

## 2017-02-18 LAB — CBC WITH DIFFERENTIAL/PLATELET
BASOS ABS: 0 10*3/uL (ref 0.0–0.1)
BASOS PCT: 0 %
Eosinophils Absolute: 0.3 10*3/uL (ref 0.0–0.7)
Eosinophils Relative: 3 %
HEMATOCRIT: 32.5 % — AB (ref 39.0–52.0)
HEMOGLOBIN: 11.6 g/dL — AB (ref 13.0–17.0)
LYMPHS PCT: 17 %
Lymphs Abs: 1.8 10*3/uL (ref 0.7–4.0)
MCH: 32.7 pg (ref 26.0–34.0)
MCHC: 35.7 g/dL (ref 30.0–36.0)
MCV: 91.5 fL (ref 78.0–100.0)
MONOS PCT: 13 %
Monocytes Absolute: 1.4 10*3/uL — ABNORMAL HIGH (ref 0.1–1.0)
NEUTROS ABS: 7.4 10*3/uL (ref 1.7–7.7)
NEUTROS PCT: 67 %
Platelets: 193 10*3/uL (ref 150–400)
RBC: 3.55 MIL/uL — AB (ref 4.22–5.81)
RDW: 13.7 % (ref 11.5–15.5)
WBC: 11.1 10*3/uL — AB (ref 4.0–10.5)

## 2017-02-18 LAB — BASIC METABOLIC PANEL
Anion gap: 7 (ref 5–15)
BUN: 16 mg/dL (ref 6–20)
CHLORIDE: 103 mmol/L (ref 101–111)
CO2: 27 mmol/L (ref 22–32)
Calcium: 9.1 mg/dL (ref 8.9–10.3)
Creatinine, Ser: 1.33 mg/dL — ABNORMAL HIGH (ref 0.61–1.24)
GFR calc Af Amer: 52 mL/min — ABNORMAL LOW (ref >60)
GFR, EST NON AFRICAN AMERICAN: 45 mL/min — AB (ref >60)
Glucose, Bld: 133 mg/dL — ABNORMAL HIGH (ref 65–99)
POTASSIUM: 3.8 mmol/L (ref 3.5–5.1)
SODIUM: 137 mmol/L (ref 135–145)

## 2017-02-18 MED ORDER — ONDANSETRON 8 MG PO TBDP: 8.0000 mg | ORAL_TABLET | Freq: Once | ORAL | Status: DC

## 2017-02-18 MED ORDER — ONDANSETRON HCL 4 MG/2ML IJ SOLN
4.0000 mg | Freq: Once | INTRAMUSCULAR | Status: AC
  Administered 2017-02-18: 4 mg via INTRAVENOUS
  Filled 2017-02-18: qty 2

## 2017-02-18 MED ORDER — FENTANYL CITRATE (PF) 100 MCG/2ML IJ SOLN
50.0000 ug | Freq: Once | INTRAMUSCULAR | Status: AC
  Administered 2017-02-18: 50 ug via INTRAVENOUS
  Filled 2017-02-18: qty 2

## 2017-02-18 MED ORDER — DEXTROSE 5 % IV SOLN
1.0000 g | Freq: Once | INTRAVENOUS | Status: AC
  Administered 2017-02-18: 1 g via INTRAVENOUS
  Filled 2017-02-18: qty 10

## 2017-02-18 MED ORDER — CEPHALEXIN 500 MG PO CAPS: 500.0000 mg | ORAL_CAPSULE | Freq: Three times a day (TID) | ORAL | 0 refills | Status: DC

## 2017-02-18 NOTE — Discharge Instructions (Signed)
Drink plenty of fluids. Take the antibiotics until gone. Let your doctor know about your ED visit tonight, he will probably want to recheck you in 2-3 days. Recheck sooner if you get a fever, vomiting, or the pain gets worse. Take acetaminophen 500 mg every 8 hrs for pain as needed.

## 2017-02-18 NOTE — ED Notes (Signed)
Patient transported to CT 

## 2017-02-18 NOTE — ED Provider Notes (Signed)
[facility] DEPT Provider Note   CSN: [id number] Arrival date & time: 02/17/17  2345 By signing my name below, I, Georgette Shell, attest that this documentation has been prepared under the direction and in the presence of Rolland Porter, MD. Electronically Signed: Georgette Shell, ED Scribe. 02/18/17. 12:54 AM.  Time seen: 00:45  History   Chief Complaint Chief Complaint  Patient presents with  . Flank Pain    HPI The history is provided by the patient and a relative. No language interpreter was used.   HPI Comments: KELAND PEYTON is a 83 y.o. male with h/o C. Diff, prostate cancer, kidney stones, CAD, HTN, MI, and UTIs, who presents to the Emergency Department complaining of gradually worsening, intermittent right-sidedl ateral abdominal pain beginning one hour ago. Pain is aching in quality. No recent injury or trauma. Pt has chronic foley catheter with leg bag for about 3 years, family at bedside reports that catheter was last changed on 02/12/17. Pt states pain is exacerbated with movement and palpation and greatly improved with standing or lying still. He has not tried any OTC medications PTA. Pt reports h/o kidney stones "a long time ago" and notes this pain feels similar. Pt is ambulatory with a cane or walker at baseline. Pt further denies fever, chills, nausea, vomiting, diarrhea, back pain, or any other associated symptoms.   PCP: Deland Pretty, MD  Past Medical History:  Diagnosis Date  . Clostridium difficile infection    2015  . Coronary artery disease involving native coronary artery 12/1989   Unstable Angina  . Hearing difficulty   . High cholesterol   . Hypertension   . Memory difficulty   . Myocardial infarction ([place])   . Prostate cancer ([place])   . Prostate cancer ([place])   . S/P CABG x 3 12/27/1989   LIMA-D1-LAD, SVG-rPDA (PDA Endarterectomy & patch andioplasty)   . Toenail fungus   . Urinary (tract) obstruction   . UTI (urinary tract infection)     Patient Active Problem  List   Diagnosis Date Noted  . Alzheimer disease 10/25/2016  . Memory loss 01/20/2015  . Urinary retention 01/20/2015  . Gait disturbance 01/20/2015  . Vitamin D deficiency 01/20/2015  . Hematuria 12/21/2014  . Pressure ulcer 12/21/2014  . Hyperlipidemia 07/07/2014  . Enteritis due to Clostridium difficile 06/29/2014  . Clostridial gastroenteritis 06/28/2014  . Acute urinary retention   . Other specified hypotension   . Non Q wave myocardial infarction ([place]) 06/23/2014  . Acute renal failure ([place]) 06/23/2014  . Acute encephalopathy 06/23/2014  . Essential hypertension 06/23/2014  . Non-ST elevation (NSTEMI) myocardial infarction ([place])   . CAD - s/p CABP 12/1989; LIMA-D1-LAD, SVG-rPDA (PDA Endarterectomy & patch andioplasty) 12/27/1989    Past Surgical History:  Procedure Laterality Date  . APPENDECTOMY    . CORONARY ARTERY BYPASS GRAFT    . CYSTOSCOPY WITH URETHRAL DILATATION N/A 12/21/2014   Procedure: CYSTOSCOPY WITH RETROGRADE AND URETHRAL DILATATION ;  Surgeon: Alexis Frock, MD;  Location: [facility] ORS;  Service: Urology;  Laterality: N/A;  . LEFT HEART CATHETERIZATION WITH CORONARY/GRAFT ANGIOGRAM N/A 06/25/2014   Procedure: LEFT HEART CATHETERIZATION WITH Beatrix Fetters;  Surgeon: Leonie Man, MD;  Location: [facility] C[facility] LAB;  Service: Cardiovascular;  Laterality: N/A;  . MULTIPLE TOOTH EXTRACTIONS Bilateral        Home Medications    Prior to Admission medications   Medication Sig Start Date End Date Taking? Authorizing Provider  aspirin 81 MG tablet Take 81 mg by  mouth daily.   Yes [provider]  atorvastatin (LIPITOR) 40 MG tablet Take 40 mg by mouth daily.   Yes [provider]  Cholecalciferol (VITAMIN D-3 PO) Take 1,000 Units by mouth daily.    Yes [provider]  Cyanocobalamin (VITAMIN B-12 PO) Take 1,000 mcg by mouth daily.    Yes [provider]  donepezil (ARICEPT) 10 MG tablet Take 1 tablet (10 mg total) by mouth  at bedtime. 10/25/16  Yes Sater, Nanine Means, MD  finasteride (PROSCAR) 5 MG tablet Take 5 mg by mouth daily.   Yes [provider]  losartan (COZAAR) 100 MG tablet Take 100 mg by mouth daily.   Yes [provider]  Melatonin 10 MG TABS Take 10 mg by mouth at bedtime.    Yes [provider]  metoprolol tartrate (LOPRESSOR) 25 MG tablet Take 12.5 mg by mouth 2 (two) times daily.    Yes [provider]  Omega-3 Fatty Acids (FISH OIL) 1200 MG CAPS Take 1,200 mg by mouth daily.   Yes [provider]  Probiotic Product (PROBIOTIC & ACIDOPHILUS EX ST PO) Take 1 tablet by mouth 2 (two) times daily.    Yes [provider]  cephALEXin (KEFLEX) 500 MG capsule Take 1 capsule (500 mg total) by mouth 3 (three) times daily. 02/18/17   Rolland Porter, MD    Family History Family History  Problem Relation Age of Onset  . Diabetes Father   . Deep vein thrombosis Father   . Neuropathy Sister   . Heart attack Brother     Social History Social History  Substance Use Topics  . Smoking status: Former Research scientist (life sciences)  . Smokeless tobacco: Never Used  . Alcohol use No  lives at home Has in home care Uses a walker and cane   Allergies   Patient has no known allergies.   Review of Systems Review of Systems  Constitutional: Negative for chills and fever.  Gastrointestinal: Negative for diarrhea, nausea and vomiting.  Genitourinary: Positive for flank pain.  Musculoskeletal: Negative for back pain.  All other systems reviewed and are negative.  Physical Exam Updated Vital Signs BP 134/60 (BP Location: Left Arm)   Pulse (!) 58   Temp 98.1 F (36.7 C) (Oral)   Resp 14   Ht 5\' 10"  (1.778 m)   Wt 170 lb (77.1 kg)   SpO2 99%   BMI 24.39 kg/m   Physical Exam  Constitutional: He is oriented to person, place, and time. He appears well-developed and well-nourished.  Non-toxic appearance. He does not appear ill. No distress.  Pleasant elderly male  HENT:    Head: Normocephalic and atraumatic.  Right Ear: External ear normal.  Left Ear: External ear normal.  Nose: Nose normal. No mucosal edema or rhinorrhea.  Mouth/Throat: Oropharynx is clear and moist and mucous membranes are normal. No dental abscesses or uvula swelling.  Eyes: Pupils are equal, round, and reactive to light. Conjunctivae and EOM are normal.  Neck: Normal range of motion and full passive range of motion without pain. Neck supple.  Cardiovascular: Normal rate, regular rhythm and normal heart sounds.  Exam reveals no gallop and no friction rub.   No murmur heard. Pulmonary/Chest: Effort normal and breath sounds normal. No respiratory distress. He has no wheezes. He has no rhonchi. He has no rales. He exhibits no tenderness and no crepitus.  Abdominal: Soft. Normal appearance and bowel sounds are normal. He exhibits no distension. There is tenderness. There is  CVA tenderness. There is no rebound and no guarding.    Tender in right lateral abdomen. Right CVA tenderness also.  Musculoskeletal: Normal range of motion. He exhibits no edema or tenderness.  Moves all extremities well.   Neurological: He is alert and oriented to person, place, and time. He has normal strength. No cranial nerve deficit.  Skin: Skin is warm, dry and intact. No rash noted. No erythema. No pallor.  Psychiatric: He has a normal mood and affect. His speech is normal and behavior is normal. His mood appears not anxious.  Nursing note and vitals reviewed.    ED Treatments / Results  Labs (all labs ordered are listed, but only abnormal results are displayed) Results for orders placed or performed during the hospital encounter of 02/17/17  Urinalysis, Routine w reflex microscopic  Result Value Ref Range   Color, Urine YELLOW YELLOW   APPearance HAZY (A) CLEAR   Specific Gravity, Urine 1.014 1.005 - 1.030   pH 6.0 5.0 - 8.0   Glucose, UA NEGATIVE NEGATIVE mg/dL   Hgb urine dipstick LARGE (A) NEGATIVE    Bilirubin Urine NEGATIVE NEGATIVE   Ketones, ur 5 (A) NEGATIVE mg/dL   Protein, ur 30 (A) NEGATIVE mg/dL   Nitrite POSITIVE (A) NEGATIVE   Leukocytes, UA MODERATE (A) NEGATIVE   RBC / HPF TOO NUMEROUS TO COUNT 0 - 5 RBC/hpf   WBC, UA TOO NUMEROUS TO COUNT 0 - 5 WBC/hpf   Bacteria, UA FEW (A) NONE SEEN   Squamous Epithelial / LPF NONE SEEN NONE SEEN   WBC Clumps PRESENT    Mucous PRESENT   Basic metabolic panel  Result Value Ref Range   Sodium 137 135 - 145 mmol/L   Potassium 3.8 3.5 - 5.1 mmol/L   Chloride 103 101 - 111 mmol/L   CO2 27 22 - 32 mmol/L   Glucose, Bld 133 (H) 65 - 99 mg/dL   BUN 16 6 - 20 mg/dL   Creatinine, Ser 1.33 (H) 0.61 - 1.24 mg/dL   Calcium 9.1 8.9 - 10.3 mg/dL   GFR calc non Af Amer 45 (L) >60 mL/min   GFR calc Af Amer 52 (L) >60 mL/min   Anion gap 7 5 - 15  CBC with Differential  Result Value Ref Range   WBC 11.1 (H) 4.0 - 10.5 K/uL   RBC 3.55 (L) 4.22 - 5.81 MIL/uL   Hemoglobin 11.6 (L) 13.0 - 17.0 g/dL   HCT 32.5 (L) 39.0 - 52.0 %   MCV 91.5 78.0 - 100.0 fL   MCH 32.7 26.0 - 34.0 pg   MCHC 35.7 30.0 - 36.0 g/dL   RDW 13.7 11.5 - 15.5 %   Platelets 193 150 - 400 K/uL   Neutrophils Relative % 67 %   Neutro Abs 7.4 1.7 - 7.7 K/uL   Lymphocytes Relative 17 %   Lymphs Abs 1.8 0.7 - 4.0 K/uL   Monocytes Relative 13 %   Monocytes Absolute 1.4 (H) 0.1 - 1.0 K/uL   Eosinophils Relative 3 %   Eosinophils Absolute 0.3 0.0 - 0.7 K/uL   Basophils Relative 0 %   Basophils Absolute 0.0 0.0 - 0.1 K/uL   Laboratory interpretation all normal except UTI, renal insufficiency, anemia, leukocytosis    EKG  EKG Interpretation None       Radiology Ct Renal Stone Study  Result Date: 02/18/2017 CLINICAL DATA:  Right flank pain for a few hours. Foley catheter in place. History of previous kidney stones,  C difficile, prostate cancer, kidney stones, urinary tract infections, hypertension, and appendectomy. EXAM: CT ABDOMEN AND PELVIS WITHOUT CONTRAST  TECHNIQUE: Multidetector CT imaging of the abdomen and pelvis was performed following the standard protocol without IV contrast. COMPARISON:  12/21/2014 FINDINGS: Lower chest: Slight fibrosis in the lung bases. Postoperative changes in the mediastinum. Hepatobiliary: Cholelithiasis with contracted gallbladder. No inflammatory infiltration. No bile duct dilatation. No focal liver lesions on noncontrast imaging. Pancreas: Unremarkable. No pancreatic ductal dilatation or surrounding inflammatory changes. Spleen: Normal in size without focal abnormality. Adrenals/Urinary Tract: Prominent left hydronephrosis and hydroureter. No ureteral stones are demonstrated. This could represent an occult non radiopaque stone, stricture, or sequela of recently passed stone, or reflux changes. Two large stones in the lower pole right kidney, largest measuring 11 mm maximal diameter. Stone is enlarged since previous study. No hydronephrosis, hydroureter, or ureteral stone on the right. Bilateral renal cysts. Bladder is decompressed with a Foley catheter in place. There is a amorphous increased density material within the bladder. This could represent contrast material, hemorrhage, or bladder stones. The shape is less likely to represent stone. Stomach/Bowel: Stomach, small bowel, and colon are partially gas and fluid-filled without distention. Scattered stool throughout the colon. Colonic diverticula without inflammatory change. Appendix is surgically absent by history. Vascular/Lymphatic: Aortic atherosclerosis. No enlarged abdominal or pelvic lymph nodes. Reproductive: Prostate seed implants are present consistent with history of treated prostate cancer. There is are enlarged left iliac chain lymph nodes measuring up to about 14 mm diameter. This has progressed since previous study. This could represent inflammatory node or metastatic disease. Other: No abdominal wall hernia or abnormality. No abdominopelvic ascites. Musculoskeletal:  Degenerative changes in the spine. No destructive or sclerotic bone lesions appreciated. IMPRESSION: 1. Left hydronephrosis and hydroureter. No ureteral stones are demonstrated. This could represent occult non radiopaque stone, stricture, reflux changes or sequela of recently passed stone. 2. Nonobstructing intrarenal stones on the right kidney. No right ureteral stones. 3. Bladder is decompressed with a Foley catheter. Increased density material in the bladder may represent contrast material, hemorrhage, or, less likely, bladder stones. 4. Prostate seed implants. Enlarged left iliac chain lymph nodes are progressing since previous study and could represent metastatic disease. 5. Aortic atherosclerosis. Electronically Signed   By: Lucienne Capers M.D.   On: 02/18/2017 01:46    Procedures Procedures (including critical care time)  Medications Ordered in ED Medications  cefTRIAXone (ROCEPHIN) 1 g in dextrose 5 % 50 mL IVPB (1 g Intravenous New Bag/Given 02/18/17 0417)  fentaNYL (SUBLIMAZE) injection 50 mcg (50 mcg Intravenous Given 02/18/17 0112)  ondansetron (ZOFRAN) injection 4 mg (4 mg Intravenous Given 02/18/17 0112)     Initial Impression / Assessment and Plan / ED Course  I have reviewed the triage vital signs and the nursing notes.  Pertinent labs & imaging results that were available during my care of the patient were reviewed by me and considered in my medical decision making (see chart for details).  DIAGNOSTIC STUDIES: Oxygen Saturation is 99% on RA, normal by my interpretation.   COORDINATION OF CARE: 12:54 AM-Discussed next steps with pt. Pt verbalized understanding and is agreeable with the plan. Patient was given IV fluids, IV pain and nausea medication. CT scan renal was ordered to look for possible renal stone, also concern for AAA.  Recheck at 4:15 AM we discussed patient's test results. He is currently pain-free. He was given IV Rocephin for UTI and urine culture was sent.  We discussed having his PCP recheck  him in the next 2 days unless he seems worse such as fever, vomiting, worsening pain.  Final Clinical Impressions(s) / ED Diagnoses   Final diagnoses:  Right flank pain  Abdominal pain, right lateral  Urinary tract infection with hematuria, site unspecified     New Prescriptions New Prescriptions   CEPHALEXIN (KEFLEX) 500 MG CAPSULE    Take 1 capsule (500 mg total) by mouth 3 (three) times daily.   Plan discharge  Rolland Porter, MD, FACEP  I personally performed the services described in this documentation, which was scribed in my presence. The recorded information has been reviewed and considered.  Rolland Porter, MD, Barbette Or, MD 02/18/17 [phone]

## 2017-02-19 ENCOUNTER — Other Ambulatory Visit: Payer: Self-pay | Admitting: Urology

## 2017-02-19 DIAGNOSIS — N320 Bladder-neck obstruction: Secondary | ICD-10-CM

## 2017-02-19 DIAGNOSIS — R3914 Feeling of incomplete bladder emptying: Secondary | ICD-10-CM

## 2017-02-21 LAB — URINE CULTURE: Culture: >=100000 — AB

## 2017-02-22 ENCOUNTER — Telehealth: Payer: Self-pay

## 2017-02-22 NOTE — Telephone Encounter (Signed)
Spoke with Daughter: Henry Curtis in regards to symptoms F/U  Pt saw PCP prior to ED visit 02/18/17  Pt feels great she said.

## 2017-03-04 ENCOUNTER — Ambulatory Visit (INDEPENDENT_AMBULATORY_CARE_PROVIDER_SITE_OTHER): Payer: Medicare Other | Admitting: Podiatry

## 2017-03-04 ENCOUNTER — Encounter: Payer: Self-pay | Admitting: Podiatry

## 2017-03-04 DIAGNOSIS — B351 Tinea unguium: Secondary | ICD-10-CM

## 2017-03-04 DIAGNOSIS — M79609 Pain in unspecified limb: Secondary | ICD-10-CM | POA: Diagnosis not present

## 2017-03-06 NOTE — Progress Notes (Signed)
Patient ID: Henry Curtis, male   DOB: 10/16/1921, 83 y.o.   MRN: [id number]  Subjective: 83 y.o. returns the office today for painful, elongated, thickened toenails which he is unable to trim himself. Denies any redness or drainage around the nails. Denies any acute changes since last appointment and no new complaints today. Denies any systemic complaints such as fevers, chills, nausea, vomiting.   Objective: AAO 3, NAD DP/PT pulses palpable 1/4, CRT less than 3 seconds Nails hypertrophic, dystrophic, elongated, brittle, discolored 10. There is tenderness overlying the nails 1-5 bilaterally. There is no surrounding erythema or drainage along the nail sites.  No open sorse today.  No open lesions or pre-ulcerative lesions are identified. No other areas of tenderness bilateral lower extremities. No overlying edema, erythema, increased warmth. No pain with calf compression, swelling, warmth, erythema.  Assessment: Patient presents with symptomatic onychomycosis  Plan: -Treatment options including alternatives, risks, complications were discussed -Nails sharply debrided 10 without complication/bleeding. -Discussed daily foot inspection. If there are any changes, to call the office immediately.  -Follow-up in 3 months or sooner if any problems are to arise. In the meantime, encouraged to call the office with any questions, concerns, changes symptoms. Will seem him at 9 weeks as the hallux nails are causing pressure to the distal portions of the toes.   Leva Baine, DPM  

## 2017-03-07 ENCOUNTER — Other Ambulatory Visit: Payer: Self-pay | Admitting: Radiology

## 2017-03-11 ENCOUNTER — Ambulatory Visit (HOSPITAL_COMMUNITY)
Admission: RE | Admit: 2017-03-11 | Discharge: 2017-03-11 | Disposition: A | Discharge status: Home / Self Care | Payer: Medicare Other | Source: Ambulatory Visit | Attending: Urology | Admitting: Urology

## 2017-03-11 ENCOUNTER — Encounter (HOSPITAL_COMMUNITY): Payer: Self-pay | Admitting: Emergency Medicine

## 2017-03-11 ENCOUNTER — Other Ambulatory Visit: Payer: Self-pay | Admitting: Urology

## 2017-03-11 ENCOUNTER — Inpatient Hospital Stay (HOSPITAL_COMMUNITY)
Admission: EM | Admit: 2017-03-11 | Discharge: 2017-03-15 | DRG: 698 | Disposition: A | Discharge status: Home Health | Payer: Medicare Other | Attending: Internal Medicine | Admitting: Internal Medicine

## 2017-03-11 ENCOUNTER — Encounter (HOSPITAL_COMMUNITY): Payer: Self-pay

## 2017-03-11 ENCOUNTER — Other Ambulatory Visit (HOSPITAL_COMMUNITY): Payer: Self-pay | Admitting: Diagnostic Radiology

## 2017-03-11 ENCOUNTER — Emergency Department (HOSPITAL_COMMUNITY): Payer: Medicare Other

## 2017-03-11 DIAGNOSIS — F0280 Dementia in other diseases classified elsewhere without behavioral disturbance: Secondary | ICD-10-CM | POA: Diagnosis present

## 2017-03-11 DIAGNOSIS — I2510 Atherosclerotic heart disease of native coronary artery without angina pectoris: Secondary | ICD-10-CM | POA: Diagnosis not present

## 2017-03-11 DIAGNOSIS — I252 Old myocardial infarction: Secondary | ICD-10-CM

## 2017-03-11 DIAGNOSIS — Z951 Presence of aortocoronary bypass graft: Secondary | ICD-10-CM | POA: Diagnosis not present

## 2017-03-11 DIAGNOSIS — I5022 Chronic systolic (congestive) heart failure: Secondary | ICD-10-CM | POA: Diagnosis not present

## 2017-03-11 DIAGNOSIS — N183 Chronic kidney disease, stage 3 unspecified: Secondary | ICD-10-CM | POA: Diagnosis present

## 2017-03-11 DIAGNOSIS — Z8249 Family history of ischemic heart disease and other diseases of the circulatory system: Secondary | ICD-10-CM

## 2017-03-11 DIAGNOSIS — Z7982 Long term (current) use of aspirin: Secondary | ICD-10-CM

## 2017-03-11 DIAGNOSIS — E785 Hyperlipidemia, unspecified: Secondary | ICD-10-CM | POA: Diagnosis present

## 2017-03-11 DIAGNOSIS — R3914 Feeling of incomplete bladder emptying: Secondary | ICD-10-CM

## 2017-03-11 DIAGNOSIS — R6520 Severe sepsis without septic shock: Secondary | ICD-10-CM | POA: Diagnosis not present

## 2017-03-11 DIAGNOSIS — N320 Bladder-neck obstruction: Secondary | ICD-10-CM

## 2017-03-11 DIAGNOSIS — T83510A Infection and inflammatory reaction due to cystostomy catheter, initial encounter: Secondary | ICD-10-CM | POA: Diagnosis not present

## 2017-03-11 DIAGNOSIS — I10 Essential (primary) hypertension: Secondary | ICD-10-CM

## 2017-03-11 DIAGNOSIS — G301 Alzheimer's disease with late onset: Secondary | ICD-10-CM | POA: Diagnosis not present

## 2017-03-11 DIAGNOSIS — B9620 Unspecified Escherichia coli [E. coli] as the cause of diseases classified elsewhere: Secondary | ICD-10-CM | POA: Diagnosis present

## 2017-03-11 DIAGNOSIS — A419 Sepsis, unspecified organism: Secondary | ICD-10-CM | POA: Diagnosis present

## 2017-03-11 DIAGNOSIS — Z833 Family history of diabetes mellitus: Secondary | ICD-10-CM | POA: Diagnosis not present

## 2017-03-11 DIAGNOSIS — Z87891 Personal history of nicotine dependence: Secondary | ICD-10-CM

## 2017-03-11 DIAGNOSIS — R031 Nonspecific low blood-pressure reading: Secondary | ICD-10-CM | POA: Diagnosis not present

## 2017-03-11 DIAGNOSIS — E7800 Pure hypercholesterolemia, unspecified: Secondary | ICD-10-CM | POA: Diagnosis present

## 2017-03-11 DIAGNOSIS — Z8546 Personal history of malignant neoplasm of prostate: Secondary | ICD-10-CM | POA: Diagnosis not present

## 2017-03-11 DIAGNOSIS — N401 Enlarged prostate with lower urinary tract symptoms: Secondary | ICD-10-CM | POA: Diagnosis present

## 2017-03-11 DIAGNOSIS — N138 Other obstructive and reflux uropathy: Secondary | ICD-10-CM | POA: Diagnosis not present

## 2017-03-11 DIAGNOSIS — I700 Atherosclerosis of aorta: Secondary | ICD-10-CM | POA: Diagnosis present

## 2017-03-11 DIAGNOSIS — Z66 Do not resuscitate: Secondary | ICD-10-CM | POA: Diagnosis present

## 2017-03-11 DIAGNOSIS — K5730 Diverticulosis of large intestine without perforation or abscess without bleeding: Secondary | ICD-10-CM | POA: Diagnosis not present

## 2017-03-11 DIAGNOSIS — Z923 Personal history of irradiation: Secondary | ICD-10-CM

## 2017-03-11 DIAGNOSIS — G9341 Metabolic encephalopathy: Secondary | ICD-10-CM | POA: Diagnosis present

## 2017-03-11 DIAGNOSIS — I130 Hypertensive heart and chronic kidney disease with heart failure and stage 1 through stage 4 chronic kidney disease, or unspecified chronic kidney disease: Secondary | ICD-10-CM | POA: Diagnosis present

## 2017-03-11 DIAGNOSIS — Z86718 Personal history of other venous thrombosis and embolism: Secondary | ICD-10-CM | POA: Diagnosis not present

## 2017-03-11 DIAGNOSIS — R6883 Chills (without fever): Secondary | ICD-10-CM | POA: Diagnosis not present

## 2017-03-11 DIAGNOSIS — N390 Urinary tract infection, site not specified: Secondary | ICD-10-CM | POA: Diagnosis not present

## 2017-03-11 DIAGNOSIS — G309 Alzheimer's disease, unspecified: Secondary | ICD-10-CM | POA: Diagnosis present

## 2017-03-11 DIAGNOSIS — R001 Bradycardia, unspecified: Secondary | ICD-10-CM | POA: Diagnosis not present

## 2017-03-11 DIAGNOSIS — K8020 Calculus of gallbladder without cholecystitis without obstruction: Secondary | ICD-10-CM | POA: Diagnosis present

## 2017-03-11 DIAGNOSIS — H9190 Unspecified hearing loss, unspecified ear: Secondary | ICD-10-CM | POA: Diagnosis not present

## 2017-03-11 LAB — URINALYSIS, ROUTINE W REFLEX MICROSCOPIC
Bilirubin Urine: NEGATIVE
Glucose, UA: NEGATIVE mg/dL
KETONES UR: NEGATIVE mg/dL
Nitrite: POSITIVE — AB
PROTEIN: 100 mg/dL — AB
Specific Gravity, Urine: 1.01 (ref 1.005–1.030)
pH: 5 (ref 5.0–8.0)

## 2017-03-11 LAB — CBC WITH DIFFERENTIAL/PLATELET
BASOS ABS: 0 10*3/uL (ref 0.0–0.1)
BASOS PCT: 0 %
Basophils Absolute: 0 10*3/uL (ref 0.0–0.1)
Basophils Relative: 0 %
EOS ABS: 0 10*3/uL (ref 0.0–0.7)
EOS PCT: 0 %
Eosinophils Absolute: 0.2 10*3/uL (ref 0.0–0.7)
Eosinophils Relative: 2 %
HCT: 31.3 % — ABNORMAL LOW (ref 39.0–52.0)
HEMATOCRIT: 31.7 % — AB (ref 39.0–52.0)
HEMOGLOBIN: 11.3 g/dL — AB (ref 13.0–17.0)
Hemoglobin: 11.2 g/dL — ABNORMAL LOW (ref 13.0–17.0)
LYMPHS ABS: 0.2 10*3/uL — AB (ref 0.7–4.0)
LYMPHS PCT: 16 %
Lymphocytes Relative: 4 %
Lymphs Abs: 1.2 10*3/uL (ref 0.7–4.0)
MCH: 32.7 pg (ref 26.0–34.0)
MCH: 33.1 pg (ref 26.0–34.0)
MCHC: 35.3 g/dL (ref 30.0–36.0)
MCHC: 36.1 g/dL — AB (ref 30.0–36.0)
MCV: 92.4 fL (ref 78.0–100.0)
MCV: 91.8 fL (ref 78.0–100.0)
MONO ABS: 1.4 10*3/uL — AB (ref 0.1–1.0)
MONO ABS: 0.5 10*3/uL (ref 0.1–1.0)
MONOS PCT: 8 %
Monocytes Relative: 17 %
NEUTROS ABS: 5 10*3/uL (ref 1.7–7.7)
NEUTROS PCT: 65 %
Neutro Abs: 5 10*3/uL (ref 1.7–7.7)
Neutrophils Relative %: 88 %
PLATELETS: 158 10*3/uL (ref 150–400)
Platelets: 160 10*3/uL (ref 150–400)
RBC: 3.43 MIL/uL — AB (ref 4.22–5.81)
RBC: 3.41 MIL/uL — ABNORMAL LOW (ref 4.22–5.81)
RDW: 13.9 % (ref 11.5–15.5)
RDW: 14 % (ref 11.5–15.5)
WBC: 7.8 10*3/uL (ref 4.0–10.5)
WBC: 5.7 10*3/uL (ref 4.0–10.5)

## 2017-03-11 LAB — COMPREHENSIVE METABOLIC PANEL
ALBUMIN: 2.6 g/dL — AB (ref 3.5–5.0)
ALK PHOS: 72 U/L (ref 38–126)
ALT: 14 U/L — AB (ref 17–63)
ANION GAP: 7 (ref 5–15)
AST: 36 U/L (ref 15–41)
BUN: 18 mg/dL (ref 6–20)
CHLORIDE: 108 mmol/L (ref 101–111)
CO2: 21 mmol/L — AB (ref 22–32)
CREATININE: 1.17 mg/dL (ref 0.61–1.24)
Calcium: 8.3 mg/dL — ABNORMAL LOW (ref 8.9–10.3)
GFR calc non Af Amer: 52 mL/min — ABNORMAL LOW (ref >60)
Glucose, Bld: 117 mg/dL — ABNORMAL HIGH (ref 65–99)
Potassium: 4.2 mmol/L (ref 3.5–5.1)
SODIUM: 136 mmol/L (ref 135–145)
Total Bilirubin: 1.7 mg/dL — ABNORMAL HIGH (ref 0.3–1.2)
Total Protein: 6.5 g/dL (ref 6.5–8.1)

## 2017-03-11 LAB — BASIC METABOLIC PANEL
ANION GAP: 5 (ref 5–15)
BUN: 21 mg/dL — ABNORMAL HIGH (ref 6–20)
CALCIUM: 9 mg/dL (ref 8.9–10.3)
CO2: 26 mmol/L (ref 22–32)
Chloride: 105 mmol/L (ref 101–111)
Creatinine, Ser: 1.31 mg/dL — ABNORMAL HIGH (ref 0.61–1.24)
GFR, EST AFRICAN AMERICAN: 53 mL/min — AB (ref >60)
GFR, EST NON AFRICAN AMERICAN: 46 mL/min — AB (ref >60)
Glucose, Bld: 123 mg/dL — ABNORMAL HIGH (ref 65–99)
POTASSIUM: 3.6 mmol/L (ref 3.5–5.1)
SODIUM: 136 mmol/L (ref 135–145)

## 2017-03-11 LAB — PROTIME-INR
INR: 1.08
INR: 1.11
PROTHROMBIN TIME: 14.4 s (ref 11.4–15.2)
Prothrombin Time: 14.1 seconds (ref 11.4–15.2)

## 2017-03-11 LAB — I-STAT CG4 LACTIC ACID, ED: Lactic Acid, Venous: 2.18 mmol/L (ref 0.5–1.9)

## 2017-03-11 MED ORDER — ACETAMINOPHEN 325 MG PO TABS
650.0000 mg | ORAL_TABLET | Freq: Once | ORAL | Status: AC
  Administered 2017-03-11: 650 mg via ORAL
  Filled 2017-03-11: qty 2

## 2017-03-11 MED ORDER — FENTANYL CITRATE (PF) 100 MCG/2ML IJ SOLN
INTRAMUSCULAR | Status: AC | PRN
  Administered 2017-03-11 (×2): 12.5 ug via INTRAVENOUS

## 2017-03-11 MED ORDER — ONDANSETRON HCL 4 MG/2ML IJ SOLN: 4.0000 mg | Freq: Three times a day (TID) | INTRAMUSCULAR | Status: DC | PRN

## 2017-03-11 MED ORDER — VANCOMYCIN HCL IN DEXTROSE 1-5 GM/200ML-% IV SOLN
1000.0000 mg | INTRAVENOUS | Status: DC
  Administered 2017-03-12 – 2017-03-13 (×3): 1000 mg via INTRAVENOUS
  Filled 2017-03-11 (×3): qty 200

## 2017-03-11 MED ORDER — VITAMIN D 1000 UNITS PO TABS
1000.0000 [IU] | ORAL_TABLET | Freq: Every day | ORAL | Status: DC
  Administered 2017-03-12 – 2017-03-15 (×4): 1000 [IU] via ORAL
  Filled 2017-03-11 (×4): qty 1

## 2017-03-11 MED ORDER — FINASTERIDE 5 MG PO TABS
5.0000 mg | ORAL_TABLET | Freq: Every day | ORAL | Status: DC
  Administered 2017-03-12: 5 mg via ORAL
  Filled 2017-03-11: qty 1

## 2017-03-11 MED ORDER — OMEGA-3-ACID ETHYL ESTERS 1 G PO CAPS
1.0000 g | ORAL_CAPSULE | Freq: Every day | ORAL | Status: DC
  Administered 2017-03-12 – 2017-03-15 (×4): 1 g via ORAL
  Filled 2017-03-11 (×4): qty 1

## 2017-03-11 MED ORDER — SODIUM CHLORIDE 0.9 % IV SOLN
INTRAVENOUS | Status: DC
  Administered 2017-03-11: 13:00:00 via INTRAVENOUS

## 2017-03-11 MED ORDER — LIDOCAINE HCL 1 % IJ SOLN
INTRAMUSCULAR | Status: AC | PRN
  Administered 2017-03-11: 20 mL

## 2017-03-11 MED ORDER — ZOLPIDEM TARTRATE 5 MG PO TABS: 5.0000 mg | ORAL_TABLET | Freq: Every evening | ORAL | Status: DC | PRN

## 2017-03-11 MED ORDER — SODIUM CHLORIDE 0.9 % IV BOLUS (SEPSIS)
1000.0000 mL | Freq: Once | INTRAVENOUS | Status: AC
  Administered 2017-03-12: 1000 mL via INTRAVENOUS

## 2017-03-11 MED ORDER — MELATONIN 10 MG PO TABS: 10.0000 mg | ORAL_TABLET | Freq: Every day | ORAL | Status: DC

## 2017-03-11 MED ORDER — SODIUM CHLORIDE 0.9 % IV BOLUS (SEPSIS)
1000.0000 mL | Freq: Once | INTRAVENOUS | Status: AC
  Administered 2017-03-11: 1000 mL via INTRAVENOUS

## 2017-03-11 MED ORDER — HYDROCODONE-ACETAMINOPHEN 5-325 MG PO TABS: 1.0000 | ORAL_TABLET | ORAL | Status: DC | PRN

## 2017-03-11 MED ORDER — DONEPEZIL HCL 5 MG PO TABS
5.0000 mg | ORAL_TABLET | Freq: Every day | ORAL | Status: DC
  Administered 2017-03-11 – 2017-03-14 (×4): 5 mg via ORAL
  Filled 2017-03-11 (×4): qty 1

## 2017-03-11 MED ORDER — CEFAZOLIN SODIUM-DEXTROSE 2-4 GM/100ML-% IV SOLN: 2.0000 g | INTRAVENOUS | Status: DC

## 2017-03-11 MED ORDER — DEXTROSE 5 % IV SOLN
2.0000 g | INTRAVENOUS | Status: DC
  Administered 2017-03-12: 2 g via INTRAVENOUS
  Filled 2017-03-11: qty 2

## 2017-03-11 MED ORDER — MIDAZOLAM HCL 2 MG/2ML IJ SOLN
INTRAMUSCULAR | Status: AC | PRN
  Administered 2017-03-11 (×2): 0.5 mg via INTRAVENOUS

## 2017-03-11 MED ORDER — PROBIOTIC & ACIDOPHILUS EX ST PO CAPS: 1.0000 | ORAL_CAPSULE | Freq: Two times a day (BID) | ORAL | Status: DC

## 2017-03-11 MED ORDER — HYDRALAZINE HCL 20 MG/ML IJ SOLN
5.0000 mg | INTRAMUSCULAR | Status: DC | PRN
  Filled 2017-03-11: qty 0.25

## 2017-03-11 MED ORDER — SODIUM CHLORIDE 0.9 % IV SOLN
INTRAVENOUS | Status: DC
  Administered 2017-03-12 – 2017-03-13 (×3): via INTRAVENOUS

## 2017-03-11 MED ORDER — MIDAZOLAM HCL 2 MG/2ML IJ SOLN
INTRAMUSCULAR | Status: AC
  Filled 2017-03-11: qty 2

## 2017-03-11 MED ORDER — IOPAMIDOL (ISOVUE-300) INJECTION 61%
INTRAVENOUS | Status: AC
  Administered 2017-03-11: 100 mL via INTRAVENOUS
  Filled 2017-03-11: qty 100

## 2017-03-11 MED ORDER — CEFEPIME HCL 2 G IJ SOLR
2.0000 g | Freq: Once | INTRAMUSCULAR | Status: AC
  Administered 2017-03-11: 2 g via INTRAVENOUS
  Filled 2017-03-11: qty 2

## 2017-03-11 MED ORDER — ASPIRIN EC 81 MG PO TBEC
81.0000 mg | DELAYED_RELEASE_TABLET | Freq: Every day | ORAL | Status: DC
  Administered 2017-03-12 – 2017-03-15 (×4): 81 mg via ORAL
  Filled 2017-03-11 (×4): qty 1

## 2017-03-11 MED ORDER — METOPROLOL TARTRATE 25 MG PO TABS: 12.5000 mg | ORAL_TABLET | Freq: Two times a day (BID) | ORAL | Status: DC

## 2017-03-11 MED ORDER — ATORVASTATIN CALCIUM 40 MG PO TABS
40.0000 mg | ORAL_TABLET | Freq: Every day | ORAL | Status: DC
  Administered 2017-03-11 – 2017-03-15 (×5): 40 mg via ORAL
  Filled 2017-03-11 (×5): qty 1

## 2017-03-11 MED ORDER — FENTANYL CITRATE (PF) 100 MCG/2ML IJ SOLN
INTRAMUSCULAR | Status: AC
  Filled 2017-03-11: qty 2

## 2017-03-11 MED ORDER — ACETAMINOPHEN 325 MG PO TABS: 650.0000 mg | ORAL_TABLET | Freq: Four times a day (QID) | ORAL | Status: DC | PRN

## 2017-03-11 MED ORDER — VITAMIN B-12 1000 MCG PO TABS
1000.0000 ug | ORAL_TABLET | Freq: Every day | ORAL | Status: DC
  Administered 2017-03-12 – 2017-03-15 (×4): 1000 ug via ORAL
  Filled 2017-03-11 (×4): qty 1

## 2017-03-11 MED ORDER — SODIUM CHLORIDE 0.9 % IV BOLUS (SEPSIS)
500.0000 mL | Freq: Once | INTRAVENOUS | Status: AC
  Administered 2017-03-11: 500 mL via INTRAVENOUS

## 2017-03-11 NOTE — ED Provider Notes (Signed)
[facility] DEPT Provider Note   CSN: [id number] Arrival date & time: 03/11/17  1918     History   Chief Complaint Chief Complaint  Patient presents with  . Chills  . Nausea    HPI Henry Curtis is a 83 y.o. male.  HPI  83 year old male with history of prostate cancer and chronic urinary tract obstruction requiring Foley catheter for the last few years presents with acute shaking and change in mental status. Found to have a fever of 103 on arrival to the ER. Earlier today this afternoon at around 2 PM he had his Foley catheter changed out for a new suprapubic catheter. He recently got over a urinary tract infection and finished up Keflex at the beginning of this month. At around 5 PM he started having shaking but states he wasn't cold or hot. He almost vomited but did not. When EMS arrived family states he seemed confused and was not following directions well. He is better at this time per them. No coughing, headache or current complaints from the patient. Typically has back pain whenever he has a UTI, denies back pain now. No current complaints, but family states he usually doesn't complain. No diarrhea but has had increased normal BMs over past 24 hours.  Past Medical History:  Diagnosis Date  . Clostridium difficile infection    2015  . Coronary artery disease involving native coronary artery 12/1989   Unstable Angina  . Hearing difficulty   . High cholesterol   . Hypertension   . Memory difficulty   . Myocardial infarction ([place])   . Prostate cancer ([place])   . Prostate cancer ([place])   . S/P CABG x 3 12/27/1989   LIMA-D1-LAD, SVG-rPDA (PDA Endarterectomy & patch andioplasty)   . Toenail fungus   . Urinary (tract) obstruction   . UTI (urinary tract infection)     Patient Active Problem List   Diagnosis Date Noted  . Complicated UTI (urinary tract infection) 03/11/2017  . Sepsis ([place]) 03/11/2017  . CKD (chronic kidney disease), stage III 03/11/2017  . Acute metabolic  encephalopathy 83/38/2505  . Alzheimer disease 10/25/2016  . Memory loss 01/20/2015  . Urinary retention 01/20/2015  . Gait disturbance 01/20/2015  . Vitamin D deficiency 01/20/2015  . Hematuria 12/21/2014  . Pressure ulcer 12/21/2014  . Hyperlipidemia 07/07/2014  . Enteritis due to Clostridium difficile 06/29/2014  . Clostridial gastroenteritis 06/28/2014  . Acute urinary retention   . Other specified hypotension   . Non Q wave myocardial infarction ([place]) 06/23/2014  . Acute renal failure ([place]) 06/23/2014  . Acute encephalopathy 06/23/2014  . Essential hypertension 06/23/2014  . Non-ST elevation (NSTEMI) myocardial infarction ([place])   . CAD - s/p CABP 12/1989; LIMA-D1-LAD, SVG-rPDA (PDA Endarterectomy & patch andioplasty) 12/27/1989    Past Surgical History:  Procedure Laterality Date  . APPENDECTOMY    . CORONARY ARTERY BYPASS GRAFT    . CYSTOSCOPY WITH URETHRAL DILATATION N/A 12/21/2014   Procedure: CYSTOSCOPY WITH RETROGRADE AND URETHRAL DILATATION ;  Surgeon: Alexis Frock, MD;  Location: [facility] ORS;  Service: Urology;  Laterality: N/A;  . LEFT HEART CATHETERIZATION WITH CORONARY/GRAFT ANGIOGRAM N/A 06/25/2014   Procedure: LEFT HEART CATHETERIZATION WITH Beatrix Fetters;  Surgeon: Leonie Man, MD;  Location: [facility] CATH LAB;  Service: Cardiovascular;  Laterality: N/A;  . MULTIPLE TOOTH EXTRACTIONS Bilateral        Home Medications    Prior to Admission medications   Medication Sig Start Date End Date Taking? Authorizing Provider  aspirin 81 MG tablet Take 81 mg by mouth daily.   Yes [provider]  atorvastatin (LIPITOR) 40 MG tablet Take 40 mg by mouth daily.   Yes [provider]  Cholecalciferol (VITAMIN D-3 PO) Take 1,000 Units by mouth daily.    Yes [provider]  Cyanocobalamin (VITAMIN B-12 PO) Take 1,000 mcg by mouth daily.    Yes [provider]  donepezil (ARICEPT) 10 MG tablet Take 1 tablet (10 mg total) by mouth at  bedtime. 10/25/16  Yes Sater, Nanine Means, MD  finasteride (PROSCAR) 5 MG tablet Take 5 mg by mouth daily.   Yes [provider]  losartan (COZAAR) 100 MG tablet Take 100 mg by mouth daily.   Yes [provider]  Melatonin 10 MG TABS Take 10 mg by mouth at bedtime.    Yes [provider]  metoprolol tartrate (LOPRESSOR) 25 MG tablet Take 12.5 mg by mouth 2 (two) times daily.    Yes [provider]  Omega-3 Fatty Acids (FISH OIL) 1200 MG CAPS Take 1,200 mg by mouth daily.   Yes [provider]  Probiotic Product (PROBIOTIC & ACIDOPHILUS EX ST PO) Take 1 tablet by mouth 2 (two) times daily.    Yes [provider]  cephALEXin (KEFLEX) 500 MG capsule Take 1 capsule (500 mg total) by mouth 3 (three) times daily. Patient not taking: Reported on 03/11/2017 02/18/17   Rolland Porter, MD    Family History Family History  Problem Relation Age of Onset  . Diabetes Father   . Deep vein thrombosis Father   . Neuropathy Sister   . Heart attack Brother     Social History Social History  Substance Use Topics  . Smoking status: Former Research scientist (life sciences)  . Smokeless tobacco: Never Used  . Alcohol use No     Allergies   Patient has no known allergies.   Review of Systems Review of Systems  Constitutional: Negative for chills and fever.  Respiratory: Negative for cough and shortness of breath.   Gastrointestinal: Positive for abdominal pain and nausea. Negative for vomiting.  Musculoskeletal: Negative for back pain.  Neurological: Negative for headaches.  Psychiatric/Behavioral: Positive for confusion.  All other systems reviewed and are negative.    Physical Exam Updated Vital Signs BP (!) 96/43   Pulse 70   Temp 99.5 F (37.5 C) (Oral)   Resp (!) 22   Ht 5\' 10"  (1.778 m)   Wt 76.2 kg (168 lb)   SpO2 97%   BMI 24.11 kg/m   Physical Exam  Constitutional: He appears well-developed and well-nourished. No distress.  HENT:  Head: Normocephalic and  atraumatic.  Right Ear: External ear normal.  Left Ear: External ear normal.  Nose: Nose normal.  Eyes: Right eye exhibits no discharge. Left eye exhibits no discharge.  Neck: Neck supple.  Cardiovascular: Normal rate, regular rhythm and normal heart sounds.   Pulmonary/Chest: Effort normal and breath sounds normal.  Abdominal: Soft. There is tenderness.  Suprapubic catheter in place  Musculoskeletal: He exhibits no edema.  Neurological: He is alert.  Skin: Skin is warm and dry. He is not diaphoretic.  Feels warm  Nursing note and vitals reviewed.    ED Treatments / Results  Labs (all labs ordered are listed, but only abnormal results are displayed) Labs Reviewed  COMPREHENSIVE METABOLIC PANEL - Abnormal; Notable for the following:       Result Value   CO2 21 (*)    Glucose, Bld 117 (*)  Calcium 8.3 (*)    Albumin 2.6 (*)    ALT 14 (*)    Total Bilirubin 1.7 (*)    GFR calc non Af Amer 52 (*)    All other components within normal limits  CBC WITH DIFFERENTIAL/PLATELET - Abnormal; Notable for the following:    RBC 3.41 (*)    Hemoglobin 11.3 (*)    HCT 31.3 (*)    MCHC 36.1 (*)    Lymphs Abs 0.2 (*)    All other components within normal limits  URINALYSIS, ROUTINE W REFLEX MICROSCOPIC - Abnormal; Notable for the following:    APPearance CLOUDY (*)    Hgb urine dipstick LARGE (*)    Protein, ur 100 (*)    Nitrite POSITIVE (*)    Leukocytes, UA LARGE (*)    Bacteria, UA MANY (*)    Squamous Epithelial / LPF 0-5 (*)    All other components within normal limits  I-STAT CG4 LACTIC ACID, ED - Abnormal; Notable for the following:    Lactic Acid, Venous 2.18 (*)    All other components within normal limits  CULTURE, BLOOD (ROUTINE X 2)  CULTURE, BLOOD (ROUTINE X 2)  URINE CULTURE  PROTIME-INR  BRAIN NATRIURETIC PEPTIDE  CBC WITH DIFFERENTIAL/PLATELET  LACTIC ACID, PLASMA  LACTIC ACID, PLASMA  PROCALCITONIN  BASIC METABOLIC PANEL  CBC    EKG  EKG  Interpretation None       Radiology Dg Chest 2 View  Result Date: 03/11/2017 CLINICAL DATA:  Sepsis and dementia EXAM: CHEST  2 VIEW COMPARISON:  06/26/2014 FINDINGS: The heart size is top normal with aortic atherosclerosis. The patient is status post CABG. Both lungs are clear. There is osteoarthritis of the included right AC and glenohumeral joints. No acute nor suspicious osseous abnormality. IMPRESSION: 1. No active cardiopulmonary disease. 2. Status post CABG. 3. Aortic atherosclerosis. Electronically Signed   By: Ashley Royalty M.D.   On: 03/11/2017 20:53   Ct Abdomen Pelvis W Contrast  Result Date: 03/11/2017 CLINICAL DATA:  Nausea and chills. Suprapubic catheter placement today. EXAM: CT ABDOMEN AND PELVIS WITH CONTRAST TECHNIQUE: Multidetector CT imaging of the abdomen and pelvis was performed using the standard protocol following bolus administration of intravenous contrast. CONTRAST:  124mL ISOVUE-300 IOPAMIDOL (ISOVUE-300) INJECTION 61% COMPARISON:  CT of the pelvis from earlier today. CT the abdomen and pelvis February 18, 2017 FINDINGS: Lower chest: No acute abnormality. Hepatobiliary: The gallbladder is decompressed and contains stones. No liver lesions are identified. Visualized portal veins are patent. Pancreas: Unremarkable. No pancreatic ductal dilatation or surrounding inflammatory changes. Spleen: Normal in size without focal abnormality. Adrenals/Urinary Tract: Cysts are seen in the right kidney. There are stones in the lower pole with the largest measuring 11 mm. No hydronephrosis on the right. The right ureter is normal in caliber are with no stones. Cysts are seen in the left kidney as well. There is hydronephrosis on the left which is similar in the interval. The left ureter remains dilated as well, also unchanged. No stones are seen in the left ureter. The bladder is decompressed with a newly placed suprapubic catheter. The catheter appears to be in good position. Stones are seen  posteriorly in the bladder. There is also some high attenuation on image 72 which could represent streak artifact or even a small amount of blood products from recent catheter placement. Stomach/Bowel: The stomach and small bowel are normal. Colonic diverticulosis is identified. There is some stranding in the pelvis thought to arise from  the bladder. This does not appear to be associated with the colon and there is no convincing evidence of diverticulitis. The colon is otherwise unremarkable. The appendix is not visualized but there is no secondary evidence of appendicitis. Vascular/Lymphatic: Atherosclerotic changes are seen in the non aneurysmal aorta. Atherosclerotic changes extend into the iliac femoral vessels. The lymph nodes in the left iliac chain are similar in the interval with a node on image 58 measuring 14 mm in short axis. The other nodes are smaller. Shotty nodes in the retroperitoneum are stable. A mildly prominent aortocaval node on image 35 is stable. No new adenopathy. Reproductive: The prostate contains brachy therapy seeds. Other: There is stranding in the pelvis which appears to originate from the bladder. This was present in July of 2018 and earlier today, perhaps due to the interval procedure. Musculoskeletal: No bony changes. IMPRESSION: 1. The patient is status post placement of a suprapubic catheter in the interval. There is stranding around the bladder which is increased since earlier today, probably due to the interval tube placement. 2. Continued left hydronephrosis, unchanged. This could be due to reflux or previous obstruction given the stability. 3. Continued adenopathy in the pelvis, unchanged. 4. Diverticulosis without diverticulitis. 5. Atherosclerosis in the aorta. 6. Cholelithiasis. 7. Nonobstructive stones in the right kidney. Aortic Atherosclerosis (ICD10-I70.0). Electronically Signed   By: Dorise Bullion III M.D   On: 03/11/2017 21:30   Ct Image Guided Drainage Percut Cath   Peritoneal Retroperit  Result Date: 03/11/2017 INDICATION: 83 year old male with history of prostate cancer and urinary retention. Patient has had a Foley catheter since 2015. Patient now needs a suprapubic catheter due to urinary tract infections. EXAM: PLACEMENT OF SUPRAPUBIC BLADDER CATHETER WITH CT GUIDANCE COMPARISON:  None. MEDICATIONS: Ancef 2 g; The antibiotic was administered in an appropriate time frame prior to skin puncture. ANESTHESIA/SEDATION: Fentanyl 50 mcg IV; Versed 1.0 mg IV Moderate Sedation Time:  20 minutes The patient was continuously monitored during the procedure by the interventional radiology nurse under my direct supervision. CONTRAST:  None FLUOROSCOPY TIME:  None COMPLICATIONS: None immediate. PROCEDURE: Informed written consent was obtained from the patient and daughter after a thorough discussion of the procedural risks, benefits and alternatives. All questions were addressed. A timeout was performed prior to the initiation of the procedure. Patient was placed supine on the CT table. Patient had a Foley catheter in place. Urine was drained from this catheter. 110 mL sterile saline was injected into the catheter in order to distend the decompressed bladder. Images through the pelvis were obtained. The anterior abdomen was prepped and draped in sterile fashion. Skin was anesthetized with 1% lidocaine. 18 gauge trocar needle was directed into the bladder with CT guidance. A stiff Amplatz wire was placed. Tract was dilated to accommodate a 12 [place] multipurpose drain. Catheter was sutured to the skin. Drain attached to gravity bag. FINDINGS: Bladder is mildly distended with 110 cc of saline. Foley catheter in the bladder. Stones within the dependent aspect of the bladder. Pigtail catheter was successfully placed within the bladder. IMPRESSION: Successful CT-guided suprapubic catheter placement. Plan for up sizing of the catheter in 4 weeks. Electronically Signed   By: Markus Daft M.D.    On: 03/11/2017 15:32    Procedures Procedures (including critical care time)  Medications Ordered in ED Medications  atorvastatin (LIPITOR) tablet 40 mg (40 mg Oral Given 03/11/17 2254)  donepezil (ARICEPT) tablet 5 mg (5 mg Oral Given 03/11/17 2254)  acetaminophen (TYLENOL) tablet 650 mg (  not administered)  sodium chloride 0.9 % bolus 1,000 mL (not administered)  omega-3 acid ethyl esters (LOVAZA) capsule 1 g (not administered)  finasteride (PROSCAR) tablet 5 mg (not administered)  Vitamin D-3 TABS 1,000 Units (not administered)  vitamin B-12 (CYANOCOBALAMIN) tablet 1,000 mcg (not administered)  aspirin tablet 81 mg (not administered)  metoprolol tartrate (LOPRESSOR) tablet 12.5 mg (not administered)  PROBIOTIC & ACIDOPHILUS EX ST CAPS 1 capsule (not administered)  ondansetron (ZOFRAN) injection 4 mg (not administered)  hydrALAZINE (APRESOLINE) injection 5 mg (not administered)  zolpidem (AMBIEN) tablet 5 mg (not administered)  0.9 %  sodium chloride infusion (not administered)  vancomycin (VANCOCIN) IVPB 1000 mg/200 mL premix (not administered)  ceFEPIme (MAXIPIME) 2 g in dextrose 5 % 50 mL IVPB (not administered)  ceFEPIme (MAXIPIME) 2 g in dextrose 5 % 50 mL IVPB (0 g Intravenous Stopped 03/11/17 2118)  acetaminophen (TYLENOL) tablet 650 mg (650 mg Oral Given 03/11/17 2018)  sodium chloride 0.9 % bolus 500 mL (0 mLs Intravenous Stopped 03/11/17 2046)  iopamidol (ISOVUE-300) 61 % injection (100 mLs Intravenous Contrast Given 03/11/17 2050)  sodium chloride 0.9 % bolus 1,000 mL (1,000 mLs Intravenous New Bag/Given 03/11/17 2157)     Initial Impression / Assessment and Plan / ED Course  I have reviewed the triage vital signs and the nursing notes.  Pertinent labs & imaging results that were available during my care of the patient were reviewed by me and considered in my medical decision making (see chart for details).     Patient's fever is likely from a recurrent urinary tract  infection. He overall appears well although his blood pressure has been down trending. He has not been hypotensive but has had BPs in the 90s. He will be given IV fluids. He was given cefepime given suspicion for urinary tract infection and pansensitive urines in the past. Given his age and comorbidities he will be admitted for further treatment and care. CT obtained given the degree of abdominal pain localized to the site but this probably more bladder pain from the UTI. Hospitalist to admit  Final Clinical Impressions(s) / ED Diagnoses   Final diagnoses:  Complicated UTI (urinary tract infection)    New Prescriptions New Prescriptions   No medications on file     Sherwood Gambler, MD 03/11/17 2319

## 2017-03-11 NOTE — Progress Notes (Signed)
Please call report to [place], [place] at [phone] @ [phone]

## 2017-03-11 NOTE — H&P (Addendum)
History and Physical    Henry Curtis GQQ:[id number] DOB: 1922/04/21 DOA: 03/11/2017  Referring MD/NP/PA:   PCP: Henry Pretty, MD   Patient coming from:  The patient is coming from home.  At baseline, pt is partially dependent for most of ADL.  Chief Complaint: Fever and chills  HPI: Henry Curtis is a 83 y.o. male with medical history significant of prostate cancer (s/p of radiation seed placement), dementia, hypertension, hyperlipidemia, CAD, CABG, C. difficile colitis, hearing loss, s/p of suprapubic catheter placement, sCHF with EF 45-50, CKD-3, who presents with fever and chills.  Per his daughter, pt has history of prostate cancer and chronic urinary tract obstruction requiring Foley catheter for the last few years. Earlier today this afternoon at around 2 PM, he had his Foley catheter changed out for a new suprapubic catheter by Dr. Anselm Pancoast of IR. Pt developed fever, chills and generalized weakness in later afternoon. Patient had temperature 103 at home. He is more confused than her baseline. No unilateral weakness, numbness or tingliness in extremities. No facial droop, slurred speech or hearing loss. She has nausea, no vomiting, diarrhea. She has mild tenderness in the surgical site. Patient denies chest pain, SOB and cough. Of note, pt had UTI. The urine culture on 02/18/17 showed pansensitive pseudomonas aeruginosa and enterococcus faecalis.   ED Course: pt was found to have bradycardia to tachycardia, tachypnea, temperature 103, positive urinalysis with moderate amount of leukocytes and positive nitrate, WBC 5.7, lactic acid 2.18, INR 1.11, stable renal function, negative chest x-ray. Patient is admitted to telemetry bed as inpatient.  # CT-abdomen/pelvis:  1. The patient is status post placement of a suprapubic catheter in the interval. There is stranding around the bladder which is increased since earlier today, probably due to the interval tube placement. 2. Continued left  hydronephrosis, unchanged. This could be due to reflux or previous obstruction given the stability. 3. Continued adenopathy in the pelvis, unchanged. 4. Diverticulosis without diverticulitis. 5. Atherosclerosis in the aorta. 6. Cholelithiasis. 7. Nonobstructive stones in the right kidney. 8. Aortic Atherosclerosis (ICD10-I70.0).  Review of Systems:   General: has fevers, chills, no body weight gain, has fatigue HEENT: no blurry vision, hearing changes or sore throat Respiratory: no dyspnea, coughing, wheezing CV: no chest pain, no palpitations GI: no nausea, vomiting, abdominal pain, diarrhea, constipation GU: no dysuria, burning on urination, increased urinary frequency, hematuria  Ext: has mild leg edema Neuro: no unilateral weakness, numbness, or tingling, no vision change or hearing loss Skin: no rash, no skin tear. MSK: No muscle spasm, no deformity, no limitation of range of movement in spin Heme: No easy bruising.  Travel history: No recent long distant travel.  Allergy: No Known Allergies  Past Medical History:  Diagnosis Date  . Clostridium difficile infection    2015  . Coronary artery disease involving native coronary artery 12/1989   Unstable Angina  . Hearing difficulty   . High cholesterol   . Hypertension   . Memory difficulty   . Myocardial infarction ([address])   . Prostate cancer ([place])   . Prostate cancer ([place])   . S/P CABG x 3 12/27/1989   LIMA-D1-LAD, SVG-rPDA (PDA Endarterectomy & patch andioplasty)   . Toenail fungus   . Urinary (tract) obstruction   . UTI (urinary tract infection)     Past Surgical History:  Procedure Laterality Date  . APPENDECTOMY    . CORONARY ARTERY BYPASS GRAFT    . CYSTOSCOPY WITH URETHRAL DILATATION N/A 12/21/2014  Procedure: CYSTOSCOPY WITH RETROGRADE AND URETHRAL DILATATION ;  Surgeon: Alexis Frock, MD;  Location: [facility];  Service: Urology;  Laterality: N/A;  . LEFT HEART CATHETERIZATION WITH CORONARY/GRAFT ANGIOGRAM N/A  06/25/2014   Procedure: LEFT HEART CATHETERIZATION WITH Beatrix Fetters;  Surgeon: Leonie Man, MD;  Location: [facility] CATH LAB;  Service: Cardiovascular;  Laterality: N/A;  . MULTIPLE TOOTH EXTRACTIONS Bilateral     Social History:  reports that he has quit smoking. He has never used smokeless tobacco. He reports that he does not drink alcohol or use drugs.  Family History:  Family History  Problem Relation Age of Onset  . Diabetes Father   . Deep vein thrombosis Father   . Neuropathy Sister   . Heart attack Brother      Prior to Admission medications   Medication Sig Start Date End Date Taking? Authorizing Provider  aspirin 81 MG tablet Take 81 mg by mouth daily.   Yes [provider]  atorvastatin (LIPITOR) 40 MG tablet Take 40 mg by mouth daily.   Yes [provider]  Cholecalciferol (VITAMIN D-3 PO) Take 1,000 Units by mouth daily.    Yes [provider]  Cyanocobalamin (VITAMIN B-12 PO) Take 1,000 mcg by mouth daily.    Yes [provider]  donepezil (ARICEPT) 10 MG tablet Take 1 tablet (10 mg total) by mouth at bedtime. 10/25/16  Yes Sater, Nanine Means, MD  finasteride (PROSCAR) 5 MG tablet Take 5 mg by mouth daily.   Yes [provider]  losartan (COZAAR) 100 MG tablet Take 100 mg by mouth daily.   Yes [provider]  Melatonin 10 MG TABS Take 10 mg by mouth at bedtime.    Yes [provider]  metoprolol tartrate (LOPRESSOR) 25 MG tablet Take 12.5 mg by mouth 2 (two) times daily.    Yes [provider]  Omega-3 Fatty Acids (FISH OIL) 1200 MG CAPS Take 1,200 mg by mouth daily.   Yes [provider]  Probiotic Product (PROBIOTIC & ACIDOPHILUS EX ST PO) Take 1 tablet by mouth 2 (two) times daily.    Yes [provider]  cephALEXin (KEFLEX) 500 MG capsule Take 1 capsule (500 mg total) by mouth 3 (three) times daily. Patient not taking: Reported on 03/11/2017 02/18/17   Rolland Porter, MD     Physical Exam: Vitals:   03/11/17 2200 03/11/17 2230 03/11/17 2300 03/11/17 2357  BP: (!) 94/48 (!) 91/42 (!) 96/43 (!) 101/55  Pulse: 72 77 70 65  Resp: (!) 27 17 (!) 22 (!) 24  Temp:    98 F (36.7 C)  TempSrc:    Oral  SpO2:  97%  100%  Weight:      Height:       General: Not in acute distress HEENT:       Eyes: PERRL, EOMI, no scleral icterus.       ENT: No discharge from the ears and nose, no pharynx injection, no tonsillar enlargement.        Neck: No JVD, no bruit, no mass felt. Heme: No neck lymph node enlargement. Cardiac: S1/S2, RRR, No murmurs, No gallops or rubs. Respiratory: No rales, wheezing, rhonchi or rubs. GI: Soft, nondistended, nontender, no rebound pain, no organomegaly, BS present. S/p of suprapubic Foley catheter placement with clean surroundings. GU: No hematuria Ext: has trace leg edema bilaterally. 2+DP/PT pulse bilaterally. Musculoskeletal: No joint deformities, No joint redness or warmth, no limitation of ROM in spin. Skin: No rashes.  Neuro: mildly confused, but oriented X3, cranial nerves II-XII grossly intact, moves all extremities normally.  Psych: Patient is not psychotic, no suicidal or hemocidal ideation.  Labs on Admission: I have personally reviewed following labs and imaging studies  CBC:  Recent Labs Lab 03/11/17 1235 03/11/17 1950 03/12/17 0000  WBC 7.8 5.7 13.4*  NEUTROABS 5.0 5.0 11.0*  HGB 11.2* 11.3* 10.5*  HCT 31.7* 31.3* 30.0*  MCV 92.4 91.8 90.6  PLT 160 158 509*   Basic Metabolic Panel:  Recent Labs Lab 03/11/17 1235 03/11/17 1950  NA 136 136  K 3.6 4.2  CL 105 108  CO2 26 21*  GLUCOSE 123* 117*  BUN 21* 18  CREATININE 1.31* 1.17  CALCIUM 9.0 8.3*   GFR: Estimated Creatinine Clearance: 41.6 mL/min (by C-G formula based on SCr of 1.17 mg/dL). Liver Function Tests:  Recent Labs Lab 03/11/17 1950  AST 36  ALT 14*  ALKPHOS 72  BILITOT 1.7*  PROT 6.5  ALBUMIN 2.6*   No results for input(s):  LIPASE, AMYLASE in the last 168 hours. No results for input(s): AMMONIA in the last 168 hours. Coagulation Profile:  Recent Labs Lab 03/11/17 1235 03/11/17 1950  INR 1.08 1.11   Cardiac Enzymes: No results for input(s): CKTOTAL, CKMB, CKMBINDEX, TROPONINI in the last 168 hours. BNP (last 3 results) No results for input(s): PROBNP in the last 8760 hours. HbA1C: No results for input(s): HGBA1C in the last 72 hours. CBG: No results for input(s): GLUCAP in the last 168 hours. Lipid Profile: No results for input(s): CHOL, HDL, LDLCALC, TRIG, CHOLHDL, LDLDIRECT in the last 72 hours. Thyroid Function Tests: No results for input(s): TSH, T4TOTAL, FREET4, T3FREE, THYROIDAB in the last 72 hours. Anemia Panel: No results for input(s): VITAMINB12, FOLATE, FERRITIN, TIBC, IRON, RETICCTPCT in the last 72 hours. Urine analysis:    Component Value Date/Time   COLORURINE YELLOW 03/11/2017 1944   APPEARANCEUR CLOUDY (A) 03/11/2017 1944   LABSPEC 1.010 03/11/2017 1944   PHURINE 5.0 03/11/2017 1944   GLUCOSEU NEGATIVE 03/11/2017 1944   HGBUR LARGE (A) 03/11/2017 1944   BILIRUBINUR NEGATIVE 03/11/2017 1944   KETONESUR NEGATIVE 03/11/2017 1944   PROTEINUR 100 (A) 03/11/2017 1944   UROBILINOGEN 1.0 06/03/2015 1105   NITRITE POSITIVE (A) 03/11/2017 1944   LEUKOCYTESUR LARGE (A) 03/11/2017 1944   Sepsis Labs: @LABRCNTIP (procalcitonin:4,lacticidven:4) )No results found for this or any previous visit (from the past 240 hour(s)).   Radiological Exams on Admission: Dg Chest 2 View  Result Date: 03/11/2017 CLINICAL DATA:  Sepsis and dementia EXAM: CHEST  2 VIEW COMPARISON:  06/26/2014 FINDINGS: The heart size is top normal with aortic atherosclerosis. The patient is status post CABG. Both lungs are clear. There is osteoarthritis of the included right AC and glenohumeral joints. No acute nor suspicious osseous abnormality. IMPRESSION: 1. No active cardiopulmonary disease. 2. Status post CABG. 3.  Aortic atherosclerosis. Electronically Signed   By: Ashley Royalty M.D.   On: 03/11/2017 20:53   Ct Abdomen Pelvis W Contrast  Result Date: 03/11/2017 CLINICAL DATA:  Nausea and chills. Suprapubic catheter placement today. EXAM: CT ABDOMEN AND PELVIS WITH CONTRAST TECHNIQUE: Multidetector CT imaging of the abdomen and pelvis was performed using the standard protocol following bolus administration of intravenous contrast. CONTRAST:  15mL ISOVUE-300 IOPAMIDOL (ISOVUE-300) INJECTION 61% COMPARISON:  CT of the pelvis from earlier today. CT the abdomen and pelvis February 18, 2017 FINDINGS: Lower chest: No acute abnormality. Hepatobiliary: The gallbladder is decompressed and contains stones. No liver lesions  are identified. Visualized portal veins are patent. Pancreas: Unremarkable. No pancreatic ductal dilatation or surrounding inflammatory changes. Spleen: Normal in size without focal abnormality. Adrenals/Urinary Tract: Cysts are seen in the right kidney. There are stones in the lower pole with the largest measuring 11 mm. No hydronephrosis on the right. The right ureter is normal in caliber are with no stones. Cysts are seen in the left kidney as well. There is hydronephrosis on the left which is similar in the interval. The left ureter remains dilated as well, also unchanged. No stones are seen in the left ureter. The bladder is decompressed with a newly placed suprapubic catheter. The catheter appears to be in good position. Stones are seen posteriorly in the bladder. There is also some high attenuation on image 72 which could represent streak artifact or even a small amount of blood products from recent catheter placement. Stomach/Bowel: The stomach and small bowel are normal. Colonic diverticulosis is identified. There is some stranding in the pelvis thought to arise from the bladder. This does not appear to be associated with the colon and there is no convincing evidence of diverticulitis. The colon is otherwise  unremarkable. The appendix is not visualized but there is no secondary evidence of appendicitis. Vascular/Lymphatic: Atherosclerotic changes are seen in the non aneurysmal aorta. Atherosclerotic changes extend into the iliac femoral vessels. The lymph nodes in the left iliac chain are similar in the interval with a node on image 58 measuring 14 mm in short axis. The other nodes are smaller. Shotty nodes in the retroperitoneum are stable. A mildly prominent aortocaval node on image 35 is stable. No new adenopathy. Reproductive: The prostate contains brachy therapy seeds. Other: There is stranding in the pelvis which appears to originate from the bladder. This was present in July of 2018 and earlier today, perhaps due to the interval procedure. Musculoskeletal: No bony changes. IMPRESSION: 1. The patient is status post placement of a suprapubic catheter in the interval. There is stranding around the bladder which is increased since earlier today, probably due to the interval tube placement. 2. Continued left hydronephrosis, unchanged. This could be due to reflux or previous obstruction given the stability. 3. Continued adenopathy in the pelvis, unchanged. 4. Diverticulosis without diverticulitis. 5. Atherosclerosis in the aorta. 6. Cholelithiasis. 7. Nonobstructive stones in the right kidney. Aortic Atherosclerosis (ICD10-I70.0). Electronically Signed   By: Dorise Bullion III M.D   On: 03/11/2017 21:30   Ct Image Guided Drainage Percut Cath  Peritoneal Retroperit  Result Date: 03/11/2017 INDICATION: 83 year old male with history of prostate cancer and urinary retention. Patient has had a Foley catheter since 2015. Patient now needs a suprapubic catheter due to urinary tract infections. EXAM: PLACEMENT OF SUPRAPUBIC BLADDER CATHETER WITH CT GUIDANCE COMPARISON:  None. MEDICATIONS: Ancef 2 g; The antibiotic was administered in an appropriate time frame prior to skin puncture. ANESTHESIA/SEDATION: Fentanyl 50 mcg  IV; Versed 1.0 mg IV Moderate Sedation Time:  20 minutes The patient was continuously monitored during the procedure by the interventional radiology nurse under my direct supervision. CONTRAST:  None FLUOROSCOPY TIME:  None COMPLICATIONS: None immediate. PROCEDURE: Informed written consent was obtained from the patient and daughter after a thorough discussion of the procedural risks, benefits and alternatives. All questions were addressed. A timeout was performed prior to the initiation of the procedure. Patient was placed supine on the CT table. Patient had a Foley catheter in place. Urine was drained from this catheter. 110 mL sterile saline was injected into the catheter  in order to distend the decompressed bladder. Images through the pelvis were obtained. The anterior abdomen was prepped and draped in sterile fashion. Skin was anesthetized with 1% lidocaine. 18 gauge trocar needle was directed into the bladder with CT guidance. A stiff Amplatz wire was placed. Tract was dilated to accommodate a 12 [place] multipurpose drain. Catheter was sutured to the skin. Drain attached to gravity bag. FINDINGS: Bladder is mildly distended with 110 cc of saline. Foley catheter in the bladder. Stones within the dependent aspect of the bladder. Pigtail catheter was successfully placed within the bladder. IMPRESSION: Successful CT-guided suprapubic catheter placement. Plan for up sizing of the catheter in 4 weeks. Electronically Signed   By: Markus Daft M.D.   On: 03/11/2017 15:32     EKG:  Not done in ED, will get one.   Assessment/Plan Principal Problem:   Complicated UTI (urinary tract infection) Active Problems:   Essential hypertension   CAD - s/p CABP 12/1989; LIMA-D1-LAD, SVG-rPDA (PDA Endarterectomy & patch andioplasty)   Hyperlipidemia   Alzheimer disease   Sepsis (HCC)   CKD (chronic kidney disease), stage III   Acute metabolic encephalopathy   Chronic systolic CHF (congestive heart failure)  (HCC)  Complicated UTI (urinary tract infection): The urine culture on 02/18/17 showed pansensitive pseudomonas aeruginosa and enterococcus faecalis. Patient admits Kristi for sepsis with fever, tachycardia, tachypnea and soft blood pressure. Lactic acid 2.18. Currently hemodynamically stable.  -will admit to tele bed as inpt -discussed with pharmacist, started IV cefepime and vancomycin -Follow up blood culture and urine culture -will get Procalcitonin and trend lactic acid levels per sepsis protocol. -IVF: 2.5L of NS bolus in ED, followed by 75 cc/h   Essential hypertension: -hold cozarr since pt has soft blood pressure and is at risk of developing hypotension secondary to sepsis -IV hydralazine when necessary -Continue metoprolol with holding parameters for SBP<100  Hx of CAD: s/p CABP 12/1989; LIMA-D1-LAD, SVG-rPDA (PDA Endarterectomy & patch andioplasty). No CP. -continue aspirin, Lipitor, metoprolol  Alzheimer disease: No behavior change. -Continue donepezil  Hx of prostate cancer: s/p of radiation seeds placement. Has chronic obstruction. s/p of suprapubic catheter placement -Continue Proscar  CKD (chronic kidney disease), stage III: Stable. Baseline creatinine 1.0-1.3. His creatinine is 1.17, do 18. -Follow-up by CBC  Acute metabolic encephalopathy: due to UTI. -Treat UTI as above -Frequent neuro check  Chronic systolic congestive heart failure: 2-D echo on 06/24/14 showed EF of 45-50 percent. Patient has trace leg edema, no JVD. CHF is compensated. Patient is not taking diuretics. -Check BNP -Continue aspirin, metoprolol.  DVT ppx: scd Code Status: DNR (I discussed with patient in the presence of her daughter who is POA, and explained the meaning of CODE STATUS. Patient wants to be DNR) Family Communication:  Yes, patient's daugher at bed side Disposition Plan:  Anticipate discharge back to previous home environment Consults called:  none Admission status:   Inpatient/tele     Date of Service 03/12/2017    Ivor Costa [facility] Pager [phone]  If 7PM-7AM, please contact night-coverage [email or web address] Password [email or web address] 03/12/2017, 2:00 AM

## 2017-03-11 NOTE — Discharge Instructions (Signed)
Change bandage around insertion site every other day. Change more often if gets wet.     Suprapubic Catheter Home Guide A suprapubic catheter is a rubber tube used to drain urine from the bladder into a collection bag. The catheter is inserted into the bladder through a small opening in the in the lower abdomen, near the center of the body, above the pubic bone (suprapubic area). There is a tiny balloon filled with germ-free (sterile) water on the end of the catheter that is in the bladder. The balloon helps to keep the catheter in place. Your suprapubic catheter may need to be replaced every 4-6 weeks, or as often as recommended by your health care provider. The collection bag must be emptied every day and cleaned every 2-3 days. The collection bag can be put beside your bed at night and attached to your leg during the day. You may have a large collection bag to use at night and a smaller one to use during the day. What are the risks?  Urine flow can become blocked. This can happen if the catheter is not working correctly, or if you have a blood clot in your bladder or in the catheter.  Tissue near the catheter may can become irritated and bleed.  Bacteria may get into your bladder and cause a urinary tract infection.   How do I care for my skin around the catheter? Use a clean washcloth and soapy water to clean the skin around your catheter every day. Pat the area dry with a clean towel.  Do not pull on the catheter.  Do not use ointment or lotion on this area unless told by your health care provider.  Check your skin around the catheter every day for signs of infection. Check for: ? Redness, swelling, or pain. ? Fluid or blood. ? Warmth. ? Pus or a bad smell.  How do I clean and empty the collection bag? Clean the collection bag every 2-3 days, or as often as told by your health care provider. To do this, take the following steps:  Wash your hands with soap and water. If soap  and water are not available, use hand sanitizer.  Disconnect the bag from the catheter and immediately attach a new bag to the catheter.  Empty the used bag completely.  Clean the used bag using one of the following methods: ? Rinse the used bag with warm water and soap. ? Fill the bag with water and add 1 tsp of vinegar. Let it sit for about 30 minutes, then empty the bag.  Let the bag dry completely, and put it in a clean plastic bag before storing it    Empty the large collection bag every 8 hours. Empty the small collection bag when it is about ? full. To empty your large or small collection bag, take the following steps:  Always keep the bag below the level of the catheter. This keeps urine from flowing backwards into the catheter.  Hold the bag over the toilet or another container. Turn the valve (spigot) at the bottom of the bag to empty the urine. ? Do not touch the opening of the spigot. ? Do not let the opening touch the toilet or container.  Close the spigot tightly when the bag is empty.  What are some general tips?  Always wash your hands before and after caring for your catheter and collection bag. Use a mild, fragrance-free soap. If soap and water are not  available, use hand sanitizer.  Clean the catheter with soap and water as often as told by your health care provider.  Always make sure there are no twists or curls (kinks) in the catheter tube.  Always make sure there are no leaks in the catheter or collection bag.  Drink enough fluid to keep your urine clear or pale yellow.  Do not take baths, swim, or use a hot tub. When should I seek medical care? Seek medical care if:  You leak urine.  You have redness, swelling, or pain around your catheter opening.  You have fluid or blood coming from your catheter opening.  Your catheter opening feels warm to the touch.  You have pus or a bad smell coming from your catheter opening.  You have a fever or  chills.  Your urine flow slows down.  Your urine becomes cloudy or smelly.  When should I seek immediate medical care? Seek immediate medical care if your catheter comes out, or if you have:  Nausea.  Back pain.  Difficulty changing your catheter.  Blood in your urine.  No urine flow for 1 hour.  This information is not intended to replace advice given to you by your health care provider. Make sure you discuss any questions you have with your health care provider. Document Released: 03/27/2011 Document Revised: 03/07/2016 Document Reviewed: 03/22/2015 Elsevier Interactive Patient Education  2018 [place].     Moderate Conscious Sedation, Adult, Care After These instructions provide you with information about caring for yourself after your procedure. Your health care provider may also give you more specific instructions. Your treatment has been planned according to current medical practices, but problems sometimes occur. Call your health care provider if you have any problems or questions after your procedure. What can I expect after the procedure? After your procedure, it is common:  To feel sleepy for several hours.  To feel clumsy and have poor balance for several hours.  To have poor judgment for several hours.  To vomit if you eat too soon.  Follow these instructions at home: For at least 24 hours after the procedure:   Do not: ? Participate in activities where you could fall or become injured. ? Drive. ? Use heavy machinery. ? Drink alcohol. ? Take sleeping pills or medicines that cause drowsiness. ? Make important decisions or sign legal documents. ? Take care of children on your own.  Rest. Eating and drinking  Follow the diet recommended by your health care provider.  If you vomit: ? Drink water, juice, or soup when you can drink without vomiting. ? Make sure you have little or no nausea before eating solid foods. General instructions  Have a  responsible adult stay with you until you are awake and alert.  Take over-the-counter and prescription medicines only as told by your health care provider.  If you smoke, do not smoke without supervision.  Keep all follow-up visits as told by your health care provider. This is important. Contact a health care provider if:  You keep feeling nauseous or you keep vomiting.  You feel light-headed.  You develop a rash.  You have a fever. Get help right away if:  You have trouble breathing. This information is not intended to replace advice given to you by your health care provider. Make sure you discuss any questions you have with your health care provider.

## 2017-03-11 NOTE — ED Notes (Signed)
MD GOLDSTON AWARE OF SEPSIS PROTOCOL.

## 2017-03-11 NOTE — ED Notes (Signed)
Bed: YT01 Expected date:  Expected time:  Means of arrival:  Comments: EMS-possible infection

## 2017-03-11 NOTE — Consult Note (Signed)
Chief Complaint: Patient was seen in consultation today for bladder obstruction  Referring Physician(s): [place]  Supervising Physician: Markus Daft  Patient Status: [facility] - Out-pt  History of Present Illness: Henry Curtis is a 83 y.o. male with past medical history of HTN, CAD, MI, and prostate cancer s/p surgical intervention now with chronic foley since 2015.  Patient has been stable with his foley cathter until recently when home health had difficulty during routine exchanges and patient developed a severe UTI at the end of July.  He has completed a course of Keflex for his UTI and has been in his usual state of health since this time.    IR consulted for suprapubic catheter placement at the request of Dr. Diona Fanti.   Patient has been NPO.  He does not take blood thinners.  He currently has foley in place.   Past Medical History:  Diagnosis Date  . Clostridium difficile infection    2015  . Coronary artery disease involving native coronary artery 12/1989   Unstable Angina  . Hearing difficulty   . High cholesterol   . Hypertension   . Memory difficulty   . Myocardial infarction ([place])   . Prostate cancer ([place])   . Prostate cancer ([place])   . S/P CABG x 3 12/27/1989   LIMA-D1-LAD, SVG-rPDA (PDA Endarterectomy & patch andioplasty)   . Toenail fungus   . Urinary (tract) obstruction   . UTI (urinary tract infection)     Past Surgical History:  Procedure Laterality Date  . APPENDECTOMY    . CORONARY ARTERY BYPASS GRAFT    . CYSTOSCOPY WITH URETHRAL DILATATION N/A 12/21/2014   Procedure: CYSTOSCOPY WITH RETROGRADE AND URETHRAL DILATATION ;  Surgeon: Alexis Frock, MD;  Location: [facility] ORS;  Service: Urology;  Laterality: N/A;  . LEFT HEART CATHETERIZATION WITH CORONARY/GRAFT ANGIOGRAM N/A 06/25/2014   Procedure: LEFT HEART CATHETERIZATION WITH Beatrix Fetters;  Surgeon: Leonie Man, MD;  Location: [facility] CATH LAB;  Service: Cardiovascular;  Laterality: N/A;    . MULTIPLE TOOTH EXTRACTIONS Bilateral     Allergies: Patient has no known allergies.  Medications: Prior to Admission medications   Medication Sig Start Date End Date Taking? Authorizing Provider  aspirin 81 MG tablet Take 81 mg by mouth daily.   Yes [provider]  atorvastatin (LIPITOR) 40 MG tablet Take 40 mg by mouth daily.   Yes [provider]  Cholecalciferol (VITAMIN D-3 PO) Take 1,000 Units by mouth daily.    Yes [provider]  Cyanocobalamin (VITAMIN B-12 PO) Take 1,000 mcg by mouth daily.    Yes [provider]  donepezil (ARICEPT) 10 MG tablet Take 1 tablet (10 mg total) by mouth at bedtime. 10/25/16  Yes Sater, Nanine Means, MD  finasteride (PROSCAR) 5 MG tablet Take 5 mg by mouth daily.   Yes [provider]  losartan (COZAAR) 100 MG tablet Take 100 mg by mouth daily.   Yes [provider]  Melatonin 10 MG TABS Take 10 mg by mouth at bedtime.    Yes [provider]  metoprolol tartrate (LOPRESSOR) 25 MG tablet Take 12.5 mg by mouth 2 (two) times daily.    Yes [provider]  Omega-3 Fatty Acids (FISH OIL) 1200 MG CAPS Take 1,200 mg by mouth daily.   Yes [provider]  Probiotic Product (PROBIOTIC & ACIDOPHILUS EX ST PO) Take 1 tablet by mouth 2 (two) times daily.    Yes [provider]  cephALEXin (KEFLEX)  500 MG capsule Take 1 capsule (500 mg total) by mouth 3 (three) times daily. 02/18/17   Rolland Porter, MD     Family History  Problem Relation Age of Onset  . Diabetes Father   . Deep vein thrombosis Father   . Neuropathy Sister   . Heart attack Brother     Social History   Social History  . Marital status: Widowed    Spouse name: N/A  . Number of children: N/A  . Years of education: N/A   Social History Main Topics  . Smoking status: Former Research scientist (life sciences)  . Smokeless tobacco: Never Used  . Alcohol use No  . Drug use: No  . Sexual activity: Not Asked   Other Topics Concern   . None   Social History Narrative   Diet heart healthy - sweets in moderation       Do you drink/ eat things with caffeine? No       Marital status: Widowed Twice                              What year were you married ?      Do you live in a house, apartment,assistred living, condo, trailer, etc.)? Home      Is it one or more stories? 2 stories      How many persons live in your home ? 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  [place]                     If not, do you want to discuss one?       Do you have signed POA?HPOA forms?  Yes               If so, please bring to your        appointment         Review of Systems  Constitutional: Negative for fatigue and fever.  Respiratory: Negative for cough and shortness of breath.   Cardiovascular: Negative for chest pain.  Genitourinary: Positive for flank pain (improved with antibiotics).  Skin: Positive for rash.  Psychiatric/Behavioral: Positive for confusion (baseline). Negative for behavioral problems.    Vital Signs: BP (!) 94/50   Pulse 60   Temp 98.2 F (36.8 C) (Oral)   Resp 16   SpO2 99%   Physical Exam  Constitutional: He is oriented to person, place, and time. He appears well-developed.  Cardiovascular: Normal rate, regular rhythm and normal heart sounds.   Pulmonary/Chest: Effort normal and breath sounds normal. No respiratory distress.  Genitourinary:  Genitourinary Comments: Foley in place with blood-tinged discharge  Neurological: He is alert and oriented to person, place, and time.  Skin: Skin is warm. Rash (papular rash in groin fold) noted.  Psychiatric: He has a normal mood and affect. His behavior is normal.  Nursing note and vitals  reviewed.  Mallampati Score:  MD Evaluation Airway: WNL Heart: WNL Abdomen: WNL Chest/ Lungs: WNL ASA  Classification: 3 Mallampati/Airway Score: Two  Imaging: Ct Renal Stone Study  Result Date: 02/18/2017 CLINICAL DATA:  Right flank pain for a few hours. Foley catheter in place. History of previous kidney stones, C difficile, prostate cancer, kidney stones, urinary tract infections, hypertension, and appendectomy. EXAM: CT ABDOMEN AND PELVIS WITHOUT CONTRAST TECHNIQUE: Multidetector CT imaging of the abdomen and pelvis was performed following the standard protocol without IV contrast. COMPARISON:  12/21/2014 FINDINGS: Lower chest: Slight fibrosis in the lung bases. Postoperative changes in the mediastinum. Hepatobiliary: Cholelithiasis with contracted gallbladder. No inflammatory infiltration. No bile duct dilatation. No focal liver lesions on noncontrast imaging. Pancreas: Unremarkable. No pancreatic ductal dilatation or surrounding inflammatory changes. Spleen: Normal in size without focal abnormality. Adrenals/Urinary Tract: Prominent left hydronephrosis and hydroureter. No ureteral stones are demonstrated. This could represent an occult non radiopaque stone, stricture, or sequela of recently passed stone, or reflux changes. Two large stones in the lower pole right kidney, largest measuring 11 mm maximal diameter. Stone is enlarged since previous study. No hydronephrosis, hydroureter, or ureteral stone on the right. Bilateral renal cysts. Bladder is decompressed with a Foley catheter in place. There is a amorphous increased density material within the bladder. This could represent contrast material, hemorrhage, or bladder stones. The shape is less likely to represent stone. Stomach/Bowel: Stomach, small bowel, and colon are partially gas and fluid-filled without distention. Scattered stool throughout the colon. Colonic diverticula without inflammatory change. Appendix is surgically absent by  history. Vascular/Lymphatic: Aortic atherosclerosis. No enlarged abdominal or pelvic lymph nodes. Reproductive: Prostate seed implants are present consistent with history of treated prostate cancer. There is are enlarged left iliac chain lymph nodes measuring up to about 14 mm diameter. This has progressed since previous study. This could represent inflammatory node or metastatic disease. Other: No abdominal wall hernia or abnormality. No abdominopelvic ascites. Musculoskeletal: Degenerative changes in the spine. No destructive or sclerotic bone lesions appreciated. IMPRESSION: 1. Left hydronephrosis and hydroureter. No ureteral stones are demonstrated. This could represent occult non radiopaque stone, stricture, reflux changes or sequela of recently passed stone. 2. Nonobstructing intrarenal stones on the right kidney. No right ureteral stones. 3. Bladder is decompressed with a Foley catheter. Increased density material in the bladder may represent contrast material, hemorrhage, or, less likely, bladder stones. 4. Prostate seed implants. Enlarged left iliac chain lymph nodes are progressing since previous study and could represent metastatic disease. 5. Aortic atherosclerosis. Electronically Signed   By: Lucienne Capers M.D.   On: 02/18/2017 01:46    Labs:  CBC:  Recent Labs  02/18/17 0104  WBC 11.1*  HGB 11.6*  HCT 32.5*  PLT 193    COAGS: No results for input(s): INR, APTT in the last 8760 hours.  BMP:  Recent Labs  02/18/17 0104  NA 137  K 3.8  CL 103  CO2 27  GLUCOSE 133*  BUN 16  CALCIUM 9.1  CREATININE 1.33*  GFRNONAA 45*  GFRAA 52*    LIVER FUNCTION TESTS: No results for input(s): BILITOT, AST, ALT, ALKPHOS, PROT, ALBUMIN in the last 8760 hours.  TUMOR MARKERS: No results for input(s): AFPTM, CEA, CA199, CHROMGRNA in the last 8760 hours.  Assessment and Plan: Patient with past medical history of bladder obstruction and urinary difficulties after prostate cancer  presents with complaint of chronic foley use.  IR consulted for suprapubic catheter placement at the request of Dr. Diona Fanti. Case reviewed  by Dr. Anselm Pancoast who approves patient for procedure.  Patient presents today in their usual state of health.  Daughter is contacting PCP re: small groin rash which may be heat-related or yeast.  He has been NPO and is not currently on blood thinners.  Risks and benefits discussed with the patient including bleeding, infection, damage to adjacent structures, bowel perforation/fistula connection, and sepsis. All of the patient's questions were answered, patient is agreeable to proceed. Consent signed and in chart.   Thank you for this interesting consult.  I greatly enjoyed meeting Henry Curtis and look forward to participating in their care.  A copy of this report was sent to the requesting provider on this date.  Electronically Signed: Docia Barrier, PA 03/11/2017, 12:51 PM   I spent a total of  30 Minutes   in face to face in clinical consultation, greater than 50% of which was counseling/coordinating care for bladder obstruction

## 2017-03-11 NOTE — Procedures (Signed)
Successful placement of 12 Fr suprapubic drain.  No immediate complication and minimal blood loss. Can remove Foley catheter. Drain attached to gravity bag, no routine flushing.  Plan to upsize catheter in 4 weeks.

## 2017-03-11 NOTE — Progress Notes (Signed)
PHARMACIST - PHYSICIAN ORDER COMMUNICATION  CONCERNING: P&T Medication Policy on Herbal Medications  DESCRIPTION:  This patient's order for:  melatonin  has been noted.  This product(s) is classified as an "herbal" or natural product. Due to a lack of definitive safety studies or FDA approval, nonstandard manufacturing practices, plus the potential risk of unknown drug-drug interactions while on inpatient medications, the Pharmacy and Therapeutics Committee does not permit the use of "herbal" or natural products of this type within [facility].   ACTION TAKEN: The pharmacy department is unable to verify this order at this time and your patient has been informed of this safety policy. Please reevaluate patient's clinical condition at discharge and address if the herbal or natural product(s) should be resumed at that time.  Dolly Rias RPh 03/11/2017, 9:53 PM Pager [phone]

## 2017-03-11 NOTE — Progress Notes (Signed)
Pharmacy Antibiotic Note  Henry Curtis is a 83 y.o. male with hx prostate cancer s/p surgical intervention with chronic foley since 2015.  He had recent enterococcus and PsA UTI at the end of July.  Patient had suprapubic cath placement on 8/20.  He presented to the ED this evening on 03/11/17 with c/o nausea and chills.  To start broad abx with vancomycin and cefepime for suspected UTI.  - Tmax 103, wbc wnl, scr 1.17 (crcl~41)   Plan: - cefepime 2 gm IV q24h - vancomycin 1000 mg IV q24h  ________________________________  Height: 5\' 10"  (177.8 cm) Weight: 168 lb (76.2 kg) IBW/kg (Calculated) : 73  Temp (24hrs), Avg:98.9 F (37.2 C), Min:97.5 F (36.4 C), Max:103 F (39.4 C)   Recent Labs Lab 03/11/17 1235 03/11/17 1950 03/11/17 2003  WBC 7.8 5.7  --   CREATININE 1.31* 1.17  --   LATICACIDVEN  --   --  2.18*    Estimated Creatinine Clearance: 41.6 mL/min (by C-G formula based on SCr of 1.17 mg/dL).    No Known Allergies  Antimicrobials this admission:  8/20  cefepime>> 8/20 vanc>>  Dose adjustments this admission:  --  Microbiology results:  7/30 ucx: enterococcus; PsA FINAL  8/30 bcx x2:  Thank you for allowing pharmacy to be a part of this patient's care.  Lynelle Doctor 03/11/2017 10:52 PM

## 2017-03-11 NOTE — ED Triage Notes (Signed)
Pt from home, hx slight dementia.  D/c this am after suprapubic cath placement.  Reports nausea & chills at home, stomach hot to touch w/out obvious distention.  Pt also has slight tremor in hands. VS: 122/53, 93 bpm, 96% RA, 18 RR

## 2017-03-11 NOTE — Progress Notes (Signed)
A consult was received from an ED physician for Cefepime per pharmacy dosing.  The patient's profile has been reviewed for ht/wt/allergies/indication/available labs.    A one time order has already been placed by ED provider for Cefepime 2g IV.  Further antibiotics/pharmacy consults should be ordered by admitting physician if indicated.                       Thank you,  Lindell Spar, PharmD, BCPS Pager: [phone] 03/11/2017 8:05 PM

## 2017-03-12 ENCOUNTER — Encounter (HOSPITAL_COMMUNITY): Payer: Self-pay

## 2017-03-12 DIAGNOSIS — I5022 Chronic systolic (congestive) heart failure: Secondary | ICD-10-CM | POA: Diagnosis present

## 2017-03-12 LAB — BASIC METABOLIC PANEL
ANION GAP: 5 (ref 5–15)
BUN: 17 mg/dL (ref 6–20)
CHLORIDE: 111 mmol/L (ref 101–111)
CO2: 22 mmol/L (ref 22–32)
Calcium: 8 mg/dL — ABNORMAL LOW (ref 8.9–10.3)
Creatinine, Ser: 1.19 mg/dL (ref 0.61–1.24)
GFR calc non Af Amer: 51 mL/min — ABNORMAL LOW (ref >60)
GFR, EST AFRICAN AMERICAN: 59 mL/min — AB (ref >60)
Glucose, Bld: 141 mg/dL — ABNORMAL HIGH (ref 65–99)
Potassium: 3.9 mmol/L (ref 3.5–5.1)
Sodium: 138 mmol/L (ref 135–145)

## 2017-03-12 LAB — CBC WITH DIFFERENTIAL/PLATELET
BASOS PCT: 0 %
Basophils Absolute: 0 10*3/uL (ref 0.0–0.1)
Eosinophils Absolute: 0 10*3/uL (ref 0.0–0.7)
Eosinophils Relative: 0 %
HEMATOCRIT: 30 % — AB (ref 39.0–52.0)
HEMOGLOBIN: 10.5 g/dL — AB (ref 13.0–17.0)
LYMPHS ABS: 0.7 10*3/uL (ref 0.7–4.0)
LYMPHS PCT: 5 %
MCH: 31.7 pg (ref 26.0–34.0)
MCHC: 35 g/dL (ref 30.0–36.0)
MCV: 90.6 fL (ref 78.0–100.0)
MONO ABS: 1.7 10*3/uL — AB (ref 0.1–1.0)
MONOS PCT: 12 %
NEUTROS ABS: 11 10*3/uL — AB (ref 1.7–7.7)
Neutrophils Relative %: 83 %
Platelets: 136 10*3/uL — ABNORMAL LOW (ref 150–400)
RBC: 3.31 MIL/uL — ABNORMAL LOW (ref 4.22–5.81)
RDW: 13.8 % (ref 11.5–15.5)
WBC: 13.4 10*3/uL — ABNORMAL HIGH (ref 4.0–10.5)

## 2017-03-12 LAB — CBC
HCT: 29.4 % — ABNORMAL LOW (ref 39.0–52.0)
HEMOGLOBIN: 10.2 g/dL — AB (ref 13.0–17.0)
MCH: 31.7 pg (ref 26.0–34.0)
MCHC: 34.7 g/dL (ref 30.0–36.0)
MCV: 91.3 fL (ref 78.0–100.0)
Platelets: 127 10*3/uL — ABNORMAL LOW (ref 150–400)
RBC: 3.22 MIL/uL — AB (ref 4.22–5.81)
RDW: 13.9 % (ref 11.5–15.5)
WBC: 14.2 10*3/uL — ABNORMAL HIGH (ref 4.0–10.5)

## 2017-03-12 LAB — PROCALCITONIN: Procalcitonin: 15.68 ng/mL

## 2017-03-12 LAB — LACTIC ACID, PLASMA
Lactic Acid, Venous: 1.6 mmol/L (ref 0.5–1.9)
Lactic Acid, Venous: 1.7 mmol/L (ref 0.5–1.9)

## 2017-03-12 LAB — GLUCOSE, CAPILLARY: Glucose-Capillary: 96 mg/dL (ref 65–99)

## 2017-03-12 LAB — BRAIN NATRIURETIC PEPTIDE: B Natriuretic Peptide: 530.7 pg/mL — ABNORMAL HIGH (ref 0.0–100.0)

## 2017-03-12 MED ORDER — SODIUM CHLORIDE 0.9 % IV BOLUS (SEPSIS)
250.0000 mL | Freq: Once | INTRAVENOUS | Status: AC
  Administered 2017-03-12: 250 mL via INTRAVENOUS

## 2017-03-12 MED ORDER — RISAQUAD PO CAPS
1.0000 | ORAL_CAPSULE | Freq: Two times a day (BID) | ORAL | Status: DC
  Administered 2017-03-13 – 2017-03-15 (×5): 1 via ORAL
  Filled 2017-03-12 (×5): qty 1

## 2017-03-12 NOTE — Progress Notes (Signed)
On call Schorr notified pt's BP 97/42, pt asymptomatic, resting comfortably in bed. Will continue to monitor pt closely.

## 2017-03-12 NOTE — Care Management Note (Signed)
Case Management Note  Patient Details  Name: MAZIN EMMA MRN: [id number] Date of Birth: 1922/02/19  Subjective/Objective: 83 y/o m admitted w/Complicated UTI. From home w/family, has custodial level care. PT cons-await recc.                   Action/Plan:d/c plan home.   Expected Discharge Date:   (unknown)               Expected Discharge Plan:  [place]  In-House Referral:  Clinical Social Work  Discharge planning Services  CM Consult  Post Acute Care Choice:  Resumption of Svcs/PTA Provider (Custodial level care) Choice offered to:     DME Arranged:    DME Agency:     HH Arranged:    Central Agency:     Status of Service:  In process, will continue to follow  If discussed at Long Length of Stay Meetings, dates discussed:    Additional Comments:  Dessa Phi, RN 03/12/2017, 1:50 PM

## 2017-03-12 NOTE — Progress Notes (Signed)
PROGRESS NOTE    Henry Curtis  ESP:[id number] DOB: 28-Mar-1922 DOA: 03/11/2017 PCP: Deland Pretty, MD     Brief Narrative:  Henry Curtis is a 83 y.o. male with medical history significant of prostate cancer (s/p of radiation seed placement), dementia, hypertension, hyperlipidemia, CAD, CABG, hx C. difficile colitis, hearing loss, s/p of suprapubic catheter placement, sCHF with EF 45-50, CKD-3, who presents with fever and chills. Pt has history of prostate cancer and chronic urinary tract obstruction requiring Foley catheter for the last few years. He had UTI in July which was treated with Keflex. He underwent suprapubic cath placement on 8/20 by Dr. Anselm Pancoast of IR. On his way home, he developed fever, chills, generalized weakness so daughter brought him to ED.   Assessment & Plan:   Principal Problem:   Complicated UTI (urinary tract infection) Active Problems:   Essential hypertension   CAD - s/p CABP 12/1989; LIMA-D1-LAD, SVG-rPDA (PDA Endarterectomy & patch andioplasty)   Hyperlipidemia   Alzheimer disease   Sepsis (HCC)   CKD (chronic kidney disease), stage III   Acute metabolic encephalopathy   Chronic systolic CHF (congestive heart failure) (HCC)   Severe sepsis secondary to complicated UTI, POA, with chronic foley (removed) and new suprapubic cath in place  -Previous urine culture on 7/30 with pansensitive pseudomonas, entercoccus.   -Continue vanco, cefepime  -Blood culture, urine culture pending  -IVF   Sinus bradycardia -Hold metoprolol  Essential HTN -Low BP overnight. Hold cozaar, metoprolol   CAD -Continue aspirin, Lipitor  Hx of prostate cancer -S/p of radiation seeds placement. Has chronic obstruction with chronic foley. Suprapubic catheter placement 8/20  CKD stage 3 -Baseline Cr 1-1.3 -Stable  Chronic systolic congestive heart failure -Stable and euvolemic   Dementia -Continue aricept    DVT prophylaxis: SCD Code Status: DNR Family Communication:  Daughter at bedside Disposition Plan: Pending improvement   Consultants:   None. I left voicemail for Dr. Acquanetta Sit nurse to let him know of patient's admission, per family request. No formal consult placed   Procedures:   None   Antimicrobials:  Anti-infectives    Start     Dose/Rate Route Frequency Ordered Stop   03/12/17 2000  ceFEPIme (MAXIPIME) 2 g in dextrose 5 % 50 mL IVPB     2 g 100 mL/hr over 30 Minutes Intravenous Every 24 hours 03/11/17 2302     03/11/17 2330  vancomycin (VANCOCIN) IVPB 1000 mg/200 mL premix     1,000 mg 200 mL/hr over 60 Minutes Intravenous Every 24 hours 03/11/17 2302     03/11/17 2000  ceFEPIme (MAXIPIME) 2 g in dextrose 5 % 50 mL IVPB     2 g 100 mL/hr over 30 Minutes Intravenous  Once 03/11/17 1957 03/11/17 2333       Subjective: No complaints, eating breakfast, feeling well overall. No pain, nausea, vomiting.   Objective: Vitals:   03/11/17 2300 03/11/17 2357 03/12/17 0608 03/12/17 0800  BP: (!) 96/43 (!) 101/55 (!) 97/42 (!) 102/43  Pulse: 70 65 (!) 55   Resp: (!) 22 (!) 24 (!) 23   Temp:  98 F (36.7 C) 98 F (36.7 C)   TempSrc:  Oral Oral   SpO2:  100% 97%   Weight:      Height:        Intake/Output Summary (Last 24 hours) at 03/12/17 1310 Last data filed at 03/12/17 0800  Gross per 24 hour  Intake  1015 ml  Output             1175 ml  Net             -160 ml   Filed Weights   03/11/17 1956  Weight: 76.2 kg (168 lb)    Examination:  General exam: Appears calm and comfortable  Respiratory system: Clear to auscultation. Respiratory effort normal. Cardiovascular system: S1 & S2 heard, bradycardic, regular rhythm. No JVD, murmurs, rubs, gallops or clicks. No pedal edema. Gastrointestinal system: Abdomen is nondistended, soft and nontender. No organomegaly or masses felt. Normal bowel sounds heard. GU: +suprapubic cath in place  Central nervous system: Alert, hard of hearing, nonfocal exam  Extremities:  Symmetric 5 x 5 power. Skin: No rashes, lesions or ulcers Psychiatry: Judgement and insight appear normal. Hx dementia   Data Reviewed: I have personally reviewed following labs and imaging studies  CBC:  Recent Labs Lab 03/11/17 1235 03/11/17 1950 03/12/17 0000 03/12/17 0244  WBC 7.8 5.7 13.4* 14.2*  NEUTROABS 5.0 5.0 11.0*  --   HGB 11.2* 11.3* 10.5* 10.2*  HCT 31.7* 31.3* 30.0* 29.4*  MCV 92.4 91.8 90.6 91.3  PLT 160 158 136* 759*   Basic Metabolic Panel:  Recent Labs Lab 03/11/17 1235 03/11/17 1950 03/12/17 0244  NA 136 136 138  K 3.6 4.2 3.9  CL 105 108 111  CO2 26 21* 22  GLUCOSE 123* 117* 141*  BUN 21* 18 17  CREATININE 1.31* 1.17 1.19  CALCIUM 9.0 8.3* 8.0*   GFR: Estimated Creatinine Clearance: 40.9 mL/min (by C-G formula based on SCr of 1.19 mg/dL). Liver Function Tests:  Recent Labs Lab 03/11/17 1950  AST 36  ALT 14*  ALKPHOS 72  BILITOT 1.7*  PROT 6.5  ALBUMIN 2.6*   No results for input(s): LIPASE, AMYLASE in the last 168 hours. No results for input(s): AMMONIA in the last 168 hours. Coagulation Profile:  Recent Labs Lab 03/11/17 1235 03/11/17 1950  INR 1.08 1.11   Cardiac Enzymes: No results for input(s): CKTOTAL, CKMB, CKMBINDEX, TROPONINI in the last 168 hours. BNP (last 3 results) No results for input(s): PROBNP in the last 8760 hours. HbA1C: No results for input(s): HGBA1C in the last 72 hours. CBG:  Recent Labs Lab 03/12/17 0735  GLUCAP 96   Lipid Profile: No results for input(s): CHOL, HDL, LDLCALC, TRIG, CHOLHDL, LDLDIRECT in the last 72 hours. Thyroid Function Tests: No results for input(s): TSH, T4TOTAL, FREET4, T3FREE, THYROIDAB in the last 72 hours. Anemia Panel: No results for input(s): VITAMINB12, FOLATE, FERRITIN, TIBC, IRON, RETICCTPCT in the last 72 hours. Sepsis Labs:  Recent Labs Lab 03/11/17 2003 03/12/17 0000 03/12/17 0244  PROCALCITON  --  15.68  --   LATICACIDVEN 2.18* 1.6 1.7    Recent  Results (from the past 240 hour(s))  Culture, blood (Routine x 2)     Status: None (Preliminary result)   Collection Time: 03/11/17  7:49 PM  Result Value Ref Range Status   Specimen Description LEFT ANTECUBITAL  Final   Special Requests   Final    BOTTLES DRAWN AEROBIC AND ANAEROBIC Blood Culture adequate volume   Culture   Final    NO GROWTH < 12 HOURS Performed at [facility], [address], [place], [place] [address]    Report Status PENDING  Incomplete  Culture, blood (Routine x 2)     Status: None (Preliminary result)   Collection Time: 03/11/17  7:50 PM  Result Value Ref  Range Status   Specimen Description BLOOD RIGHT FOREARM  Final   Special Requests   Final    BOTTLES DRAWN AEROBIC AND ANAEROBIC Blood Culture adequate volume   Culture   Final    NO GROWTH < 12 HOURS Performed at [facility], [address], [place], [place] [address]    Report Status PENDING  Incomplete       Radiology Studies: Dg Chest 2 View  Result Date: 03/11/2017 CLINICAL DATA:  Sepsis and dementia EXAM: CHEST  2 VIEW COMPARISON:  06/26/2014 FINDINGS: The heart size is top normal with aortic atherosclerosis. The patient is status post CABG. Both lungs are clear. There is osteoarthritis of the included right AC and glenohumeral joints. No acute nor suspicious osseous abnormality. IMPRESSION: 1. No active cardiopulmonary disease. 2. Status post CABG. 3. Aortic atherosclerosis. Electronically Signed   By: Ashley Royalty M.D.   On: 03/11/2017 20:53   Ct Abdomen Pelvis W Contrast  Result Date: 03/11/2017 CLINICAL DATA:  Nausea and chills. Suprapubic catheter placement today. EXAM: CT ABDOMEN AND PELVIS WITH CONTRAST TECHNIQUE: Multidetector CT imaging of the abdomen and pelvis was performed using the standard protocol following bolus administration of intravenous contrast. CONTRAST:  173mL ISOVUE-300 IOPAMIDOL (ISOVUE-300) INJECTION 61% COMPARISON:  CT of the pelvis from earlier today. CT the  abdomen and pelvis February 18, 2017 FINDINGS: Lower chest: No acute abnormality. Hepatobiliary: The gallbladder is decompressed and contains stones. No liver lesions are identified. Visualized portal veins are patent. Pancreas: Unremarkable. No pancreatic ductal dilatation or surrounding inflammatory changes. Spleen: Normal in size without focal abnormality. Adrenals/Urinary Tract: Cysts are seen in the right kidney. There are stones in the lower pole with the largest measuring 11 mm. No hydronephrosis on the right. The right ureter is normal in caliber are with no stones. Cysts are seen in the left kidney as well. There is hydronephrosis on the left which is similar in the interval. The left ureter remains dilated as well, also unchanged. No stones are seen in the left ureter. The bladder is decompressed with a newly placed suprapubic catheter. The catheter appears to be in good position. Stones are seen posteriorly in the bladder. There is also some high attenuation on image 72 which could represent streak artifact or even a small amount of blood products from recent catheter placement. Stomach/Bowel: The stomach and small bowel are normal. Colonic diverticulosis is identified. There is some stranding in the pelvis thought to arise from the bladder. This does not appear to be associated with the colon and there is no convincing evidence of diverticulitis. The colon is otherwise unremarkable. The appendix is not visualized but there is no secondary evidence of appendicitis. Vascular/Lymphatic: Atherosclerotic changes are seen in the non aneurysmal aorta. Atherosclerotic changes extend into the iliac femoral vessels. The lymph nodes in the left iliac chain are similar in the interval with a node on image 58 measuring 14 mm in short axis. The other nodes are smaller. Shotty nodes in the retroperitoneum are stable. A mildly prominent aortocaval node on image 35 is stable. No new adenopathy. Reproductive: The prostate  contains brachy therapy seeds. Other: There is stranding in the pelvis which appears to originate from the bladder. This was present in July of 2018 and earlier today, perhaps due to the interval procedure. Musculoskeletal: No bony changes. IMPRESSION: 1. The patient is status post placement of a suprapubic catheter in the interval. There is stranding around the bladder which is increased since earlier today,  probably due to the interval tube placement. 2. Continued left hydronephrosis, unchanged. This could be due to reflux or previous obstruction given the stability. 3. Continued adenopathy in the pelvis, unchanged. 4. Diverticulosis without diverticulitis. 5. Atherosclerosis in the aorta. 6. Cholelithiasis. 7. Nonobstructive stones in the right kidney. Aortic Atherosclerosis (ICD10-I70.0). Electronically Signed   By: Dorise Bullion III M.D   On: 03/11/2017 21:30   Ct Image Guided Drainage Percut Cath  Peritoneal Retroperit  Result Date: 03/11/2017 INDICATION: 83 year old male with history of prostate cancer and urinary retention. Patient has had a Foley catheter since 2015. Patient now needs a suprapubic catheter due to urinary tract infections. EXAM: PLACEMENT OF SUPRAPUBIC BLADDER CATHETER WITH CT GUIDANCE COMPARISON:  None. MEDICATIONS: Ancef 2 g; The antibiotic was administered in an appropriate time frame prior to skin puncture. ANESTHESIA/SEDATION: Fentanyl 50 mcg IV; Versed 1.0 mg IV Moderate Sedation Time:  20 minutes The patient was continuously monitored during the procedure by the interventional radiology nurse under my direct supervision. CONTRAST:  None FLUOROSCOPY TIME:  None COMPLICATIONS: None immediate. PROCEDURE: Informed written consent was obtained from the patient and daughter after a thorough discussion of the procedural risks, benefits and alternatives. All questions were addressed. A timeout was performed prior to the initiation of the procedure. Patient was placed supine on the CT  table. Patient had a Foley catheter in place. Urine was drained from this catheter. 110 mL sterile saline was injected into the catheter in order to distend the decompressed bladder. Images through the pelvis were obtained. The anterior abdomen was prepped and draped in sterile fashion. Skin was anesthetized with 1% lidocaine. 18 gauge trocar needle was directed into the bladder with CT guidance. A stiff Amplatz wire was placed. Tract was dilated to accommodate a 12 [place] multipurpose drain. Catheter was sutured to the skin. Drain attached to gravity bag. FINDINGS: Bladder is mildly distended with 110 cc of saline. Foley catheter in the bladder. Stones within the dependent aspect of the bladder. Pigtail catheter was successfully placed within the bladder. IMPRESSION: Successful CT-guided suprapubic catheter placement. Plan for up sizing of the catheter in 4 weeks. Electronically Signed   By: Markus Daft M.D.   On: 03/11/2017 15:32      Scheduled Meds: . [START ON 03/13/2017] acidophilus  1 capsule Oral BID  . aspirin EC  81 mg Oral Daily  . atorvastatin  40 mg Oral Daily  . cholecalciferol  1,000 Units Oral Daily  . donepezil  5 mg Oral QHS  . omega-3 acid ethyl esters  1 g Oral Daily  . vitamin B-12  1,000 mcg Oral Daily   Continuous Infusions: . sodium chloride 75 mL/hr at 03/12/17 1249  . ceFEPime (MAXIPIME) IV    . vancomycin Stopped (03/12/17 0102)     LOS: 1 day    Time spent: 40 minutes   Dessa Phi, DO [facility] [email or web address] Password [email or web address] 03/12/2017, 1:10 PM

## 2017-03-12 NOTE — Progress Notes (Signed)
Referring Physician(s):  Dr. Franchot Gallo  Supervising Physician: Markus Daft  Patient Status:  [facility] - In-pt  Chief Complaint:  UTI, s/p suprapubic catheter placement 03/11/17  Subjective: Patient finishing lunch.  Alert and conversant today.  States he is feeling well.   Allergies: Patient has no known allergies.  Medications: Prior to Admission medications   Medication Sig Start Date End Date Taking? Authorizing Provider  aspirin 81 MG tablet Take 81 mg by mouth daily.   Yes [provider]  atorvastatin (LIPITOR) 40 MG tablet Take 40 mg by mouth daily.   Yes [provider]  Cholecalciferol (VITAMIN D-3 PO) Take 1,000 Units by mouth daily.    Yes [provider]  Cyanocobalamin (VITAMIN B-12 PO) Take 1,000 mcg by mouth daily.    Yes [provider]  donepezil (ARICEPT) 10 MG tablet Take 1 tablet (10 mg total) by mouth at bedtime. 10/25/16  Yes Sater, Nanine Means, MD  finasteride (PROSCAR) 5 MG tablet Take 5 mg by mouth daily.   Yes [provider]  losartan (COZAAR) 100 MG tablet Take 100 mg by mouth daily.   Yes [provider]  Melatonin 10 MG TABS Take 10 mg by mouth at bedtime.    Yes [provider]  metoprolol tartrate (LOPRESSOR) 25 MG tablet Take 12.5 mg by mouth 2 (two) times daily.    Yes [provider]  Omega-3 Fatty Acids (FISH OIL) 1200 MG CAPS Take 1,200 mg by mouth daily.   Yes [provider]  Probiotic Product (PROBIOTIC & ACIDOPHILUS EX ST PO) Take 1 tablet by mouth 2 (two) times daily.    Yes [provider]  cephALEXin (KEFLEX) 500 MG capsule Take 1 capsule (500 mg total) by mouth 3 (three) times daily. Patient not taking: Reported on 03/11/2017 02/18/17   Rolland Porter, MD     Vital Signs: BP (!) 94/42 (BP Location: Right Arm) Comment: MD made aware  Pulse (!) 52 Comment: MD made aware  Temp 98.2 F (36.8 C) (Oral)   Resp 19   Ht 5\' 10"  (1.778 m)   Wt 168 lb (76.2  kg)   SpO2 99%   BMI 24.11 kg/m   Physical Exam  Constitutional: He appears well-developed.  Cardiovascular: Normal rate and regular rhythm.   Pulmonary/Chest: Effort normal.  Abdominal: Soft.  Genitourinary:  Genitourinary Comments: Suprapubic catheter in place.  Insertion site c/d/i.  Dressing in place. Clear urine in collection bag.   Nursing note and vitals reviewed.   Imaging: Dg Chest 2 View  Result Date: 03/11/2017 CLINICAL DATA:  Sepsis and dementia EXAM: CHEST  2 VIEW COMPARISON:  06/26/2014 FINDINGS: The heart size is top normal with aortic atherosclerosis. The patient is status post CABG. Both lungs are clear. There is osteoarthritis of the included right AC and glenohumeral joints. No acute nor suspicious osseous abnormality. IMPRESSION: 1. No active cardiopulmonary disease. 2. Status post CABG. 3. Aortic atherosclerosis. Electronically Signed   By: Ashley Royalty M.D.   On: 03/11/2017 20:53   Ct Abdomen Pelvis W Contrast  Result Date: 03/11/2017 CLINICAL DATA:  Nausea and chills. Suprapubic catheter placement today. EXAM: CT ABDOMEN AND PELVIS WITH CONTRAST TECHNIQUE: Multidetector CT imaging of the abdomen and pelvis was performed using the standard protocol following bolus administration of intravenous contrast. CONTRAST:  128mL ISOVUE-300 IOPAMIDOL (ISOVUE-300) INJECTION 61% COMPARISON:  CT of the pelvis from earlier today. CT the abdomen and pelvis February 18, 2017 FINDINGS: Lower chest: No acute  abnormality. Hepatobiliary: The gallbladder is decompressed and contains stones. No liver lesions are identified. Visualized portal veins are patent. Pancreas: Unremarkable. No pancreatic ductal dilatation or surrounding inflammatory changes. Spleen: Normal in size without focal abnormality. Adrenals/Urinary Tract: Cysts are seen in the right kidney. There are stones in the lower pole with the largest measuring 11 mm. No hydronephrosis on the right. The right ureter is normal in caliber are  with no stones. Cysts are seen in the left kidney as well. There is hydronephrosis on the left which is similar in the interval. The left ureter remains dilated as well, also unchanged. No stones are seen in the left ureter. The bladder is decompressed with a newly placed suprapubic catheter. The catheter appears to be in good position. Stones are seen posteriorly in the bladder. There is also some high attenuation on image 72 which could represent streak artifact or even a small amount of blood products from recent catheter placement. Stomach/Bowel: The stomach and small bowel are normal. Colonic diverticulosis is identified. There is some stranding in the pelvis thought to arise from the bladder. This does not appear to be associated with the colon and there is no convincing evidence of diverticulitis. The colon is otherwise unremarkable. The appendix is not visualized but there is no secondary evidence of appendicitis. Vascular/Lymphatic: Atherosclerotic changes are seen in the non aneurysmal aorta. Atherosclerotic changes extend into the iliac femoral vessels. The lymph nodes in the left iliac chain are similar in the interval with a node on image 58 measuring 14 mm in short axis. The other nodes are smaller. Shotty nodes in the retroperitoneum are stable. A mildly prominent aortocaval node on image 35 is stable. No new adenopathy. Reproductive: The prostate contains brachy therapy seeds. Other: There is stranding in the pelvis which appears to originate from the bladder. This was present in July of 2018 and earlier today, perhaps due to the interval procedure. Musculoskeletal: No bony changes. IMPRESSION: 1. The patient is status post placement of a suprapubic catheter in the interval. There is stranding around the bladder which is increased since earlier today, probably due to the interval tube placement. 2. Continued left hydronephrosis, unchanged. This could be due to reflux or previous obstruction given the  stability. 3. Continued adenopathy in the pelvis, unchanged. 4. Diverticulosis without diverticulitis. 5. Atherosclerosis in the aorta. 6. Cholelithiasis. 7. Nonobstructive stones in the right kidney. Aortic Atherosclerosis (ICD10-I70.0). Electronically Signed   By: Dorise Bullion III M.D   On: 03/11/2017 21:30   Ct Image Guided Drainage Percut Cath  Peritoneal Retroperit  Result Date: 03/11/2017 INDICATION: 83 year old male with history of prostate cancer and urinary retention. Patient has had a Foley catheter since 2015. Patient now needs a suprapubic catheter due to urinary tract infections. EXAM: PLACEMENT OF SUPRAPUBIC BLADDER CATHETER WITH CT GUIDANCE COMPARISON:  None. MEDICATIONS: Ancef 2 g; The antibiotic was administered in an appropriate time frame prior to skin puncture. ANESTHESIA/SEDATION: Fentanyl 50 mcg IV; Versed 1.0 mg IV Moderate Sedation Time:  20 minutes The patient was continuously monitored during the procedure by the interventional radiology nurse under my direct supervision. CONTRAST:  None FLUOROSCOPY TIME:  None COMPLICATIONS: None immediate. PROCEDURE: Informed written consent was obtained from the patient and daughter after a thorough discussion of the procedural risks, benefits and alternatives. All questions were addressed. A timeout was performed prior to the initiation of the procedure. Patient was placed supine on the CT table. Patient had a Foley catheter in place. Urine was drained  from this catheter. 110 mL sterile saline was injected into the catheter in order to distend the decompressed bladder. Images through the pelvis were obtained. The anterior abdomen was prepped and draped in sterile fashion. Skin was anesthetized with 1% lidocaine. 18 gauge trocar needle was directed into the bladder with CT guidance. A stiff Amplatz wire was placed. Tract was dilated to accommodate a 12 [place] multipurpose drain. Catheter was sutured to the skin. Drain attached to gravity bag.  FINDINGS: Bladder is mildly distended with 110 cc of saline. Foley catheter in the bladder. Stones within the dependent aspect of the bladder. Pigtail catheter was successfully placed within the bladder. IMPRESSION: Successful CT-guided suprapubic catheter placement. Plan for up sizing of the catheter in 4 weeks. Electronically Signed   By: Markus Daft M.D.   On: 03/11/2017 15:32    Labs:  CBC:  Recent Labs  03/11/17 1235 03/11/17 1950 03/12/17 0000 03/12/17 0244  WBC 7.8 5.7 13.4* 14.2*  HGB 11.2* 11.3* 10.5* 10.2*  HCT 31.7* 31.3* 30.0* 29.4*  PLT 160 158 136* 127*    COAGS:  Recent Labs  03/11/17 1235 03/11/17 1950  INR 1.08 1.11    BMP:  Recent Labs  02/18/17 0104 03/11/17 1235 03/11/17 1950 03/12/17 0244  NA 137 136 136 138  K 3.8 3.6 4.2 3.9  CL 103 105 108 111  CO2 27 26 21* 22  GLUCOSE 133* 123* 117* 141*  BUN 16 21* 18 17  CALCIUM 9.1 9.0 8.3* 8.0*  CREATININE 1.33* 1.31* 1.17 1.19  GFRNONAA 45* 46* 52* 51*  GFRAA 52* 53* >60 59*    LIVER FUNCTION TESTS:  Recent Labs  03/11/17 1950  BILITOT 1.7*  AST 36  ALT 14*  ALKPHOS 72  PROT 6.5  ALBUMIN 2.6*    Assessment and Plan: Chronic bladder obstruction s/p suprapubic catheter placement 03/11/17 Admitted overnight for suspected UTI. Currently afebrile.  Suprapubic catheter in place is functioning well. Insertion site is stable.  Patient currently on IV vanc and cefepime. Continue drain care per Urology.  Electronically Signed: Docia Barrier, PA 03/12/2017, 3:14 PM   I spent a total of 15 Minutes at the the patient's bedside AND on the patient's hospital floor or unit, greater than 50% of which was counseling/coordinating care for bladder obstruction.

## 2017-03-13 DIAGNOSIS — I5022 Chronic systolic (congestive) heart failure: Secondary | ICD-10-CM

## 2017-03-13 LAB — BASIC METABOLIC PANEL
Anion gap: 2 — ABNORMAL LOW (ref 5–15)
BUN: 18 mg/dL (ref 6–20)
CALCIUM: 8.1 mg/dL — AB (ref 8.9–10.3)
CO2: 21 mmol/L — ABNORMAL LOW (ref 22–32)
Chloride: 115 mmol/L — ABNORMAL HIGH (ref 101–111)
Creatinine, Ser: 1.05 mg/dL (ref 0.61–1.24)
GFR calc Af Amer: >60 mL/min (ref >60)
GFR, EST NON AFRICAN AMERICAN: 59 mL/min — AB (ref >60)
GLUCOSE: 93 mg/dL (ref 65–99)
POTASSIUM: 3.3 mmol/L — AB (ref 3.5–5.1)
SODIUM: 138 mmol/L (ref 135–145)

## 2017-03-13 LAB — CBC
HCT: 26.6 % — ABNORMAL LOW (ref 39.0–52.0)
Hemoglobin: 9.6 g/dL — ABNORMAL LOW (ref 13.0–17.0)
MCH: 33.4 pg (ref 26.0–34.0)
MCHC: 36.1 g/dL — AB (ref 30.0–36.0)
MCV: 92.7 fL (ref 78.0–100.0)
PLATELETS: 115 10*3/uL — AB (ref 150–400)
RBC: 2.87 MIL/uL — AB (ref 4.22–5.81)
RDW: 14.3 % (ref 11.5–15.5)
WBC: 7.2 10*3/uL (ref 4.0–10.5)

## 2017-03-13 LAB — GLUCOSE, CAPILLARY: GLUCOSE-CAPILLARY: 94 mg/dL (ref 65–99)

## 2017-03-13 MED ORDER — DEXTROSE 5 % IV SOLN
1.0000 g | Freq: Two times a day (BID) | INTRAVENOUS | Status: DC
  Administered 2017-03-13 – 2017-03-14 (×4): 1 g via INTRAVENOUS
  Filled 2017-03-13 (×5): qty 1

## 2017-03-13 MED ORDER — ENOXAPARIN SODIUM 30 MG/0.3ML ~~LOC~~ SOLN
30.0000 mg | SUBCUTANEOUS | Status: DC
  Administered 2017-03-13: 30 mg via SUBCUTANEOUS
  Filled 2017-03-13: qty 0.3

## 2017-03-13 MED ORDER — POTASSIUM CHLORIDE 20 MEQ/15ML (10%) PO SOLN
40.0000 meq | Freq: Once | ORAL | Status: AC
  Administered 2017-03-13: 40 meq via ORAL
  Filled 2017-03-13: qty 30

## 2017-03-13 NOTE — Progress Notes (Signed)
PROGRESS NOTE    Henry Curtis  HGD:[id number] DOB: [birth year] DOA: 03/11/2017 PCP: Deland Pretty, MD     Brief Narrative:  Henry Curtis is a 83 y.o. male with medical history significant of prostate cancer (s/p of radiation seed placement), dementia, hypertension, hyperlipidemia, CAD, CABG, hx C. difficile colitis, hearing loss, s/p of suprapubic catheter placement, sCHF with EF 45-50, CKD-3, who presents with fever and chills. Pt has history of prostate cancer and chronic urinary tract obstruction requiring Foley catheter for the last few years. He had UTI in July which was treated with Keflex. He underwent suprapubic cath placement on 8/20 by Dr. Anselm Pancoast of IR. On his way home, he developed fever, chills, generalized weakness so daughter brought him to ED.   Assessment & Plan:   Severe sepsis secondary to complicated UTI, POA, with chronic foley (removed) and new suprapubic cath in place  -Previous urine culture on 7/30 with pansensitive pseudomonas, entercoccus.   -clinically improving, continue vanco, cefepime  -Blood culture, urine culture pending -re incubated -stop IVF -sepsis physiology resolved   Sinus bradycardia -Hold metoprolol  Essential HTN -Low BP overnight. Hold cozaar, metoprolol   CAD -Continue aspirin, Lipitor  Hx of prostate cancer -S/p of radiation seeds placement. Has chronic obstruction with chronic foley.  -Suprapubic catheter placement 8/20 -U with Urology  CKD stage 3 -Baseline Cr 1-1.3 -Stable  Chronic systolic congestive heart failure -Stable and euvolemic   Dementia -Continue aricept   DVT prophylaxis: add lovenox Code Status: DNR Family Communication: None at bedside, called and d/w daughter Disposition Plan: Pending improvement and Cx data   Consultants:   Dr.CHoi left voicemail for Dr. Acquanetta Sit nurse to let him know of patient's admission, per family request. No formal consult placed   Procedures:   None   Antimicrobials:    Anti-infectives    Start     Dose/Rate Route Frequency Ordered Stop   03/13/17 1200  ceFEPIme (MAXIPIME) 1 g in dextrose 5 % 50 mL IVPB     1 g 100 mL/hr over 30 Minutes Intravenous Every 12 hours 03/13/17 1040     03/12/17 2000  ceFEPIme (MAXIPIME) 2 g in dextrose 5 % 50 mL IVPB  Status:  Discontinued     2 g 100 mL/hr over 30 Minutes Intravenous Every 24 hours 03/11/17 2302 03/13/17 1040   03/11/17 2330  vancomycin (VANCOCIN) IVPB 1000 mg/200 mL premix     1,000 mg 200 mL/hr over 60 Minutes Intravenous Every 24 hours 03/11/17 2302     03/11/17 2000  ceFEPIme (MAXIPIME) 2 g in dextrose 5 % 50 mL IVPB     2 g 100 mL/hr over 30 Minutes Intravenous  Once 03/11/17 1957 03/11/17 2333       Subjective: -Feels better, no complaints this morning  Objective: Vitals:   03/12/17 0800 03/12/17 1438 03/12/17 2043 03/13/17 0507  BP: (!) 102/43 (!) 94/42 (!) 109/50 (!) 119/53  Pulse:  (!) 52 62 (!) 48  Resp:  19 19 18   Temp:  98.2 F (36.8 C) 98.4 F (36.9 C) 98.2 F (36.8 C)  TempSrc:  Oral Oral Oral  SpO2:  99% 99% 95%  Weight:      Height:        Intake/Output Summary (Last 24 hours) at 03/13/17 1322 Last data filed at 03/13/17 1029  Gross per 24 hour  Intake          2629.17 ml  Output  1050 ml  Net          1579.17 ml   Filed Weights   03/11/17 1956  Weight: 76.2 kg (168 lb)    Examination:  Gen: Awake, Alert, Oriented X To self and place, comfortable, no distress HEENT: PERRLA, Neck supple, no JVD Lungs: Good air movement bilaterally, CTAB CVS: RRR,No Gallops,Rubs or new Murmurs Abd: soft, Non tender, non distended, BS present, Suprapubic catheter noted Extremities: No Cyanosis, Clubbing or edema Skin: no new rashes   Data Reviewed: I have personally reviewed following labs and imaging studies  CBC:  Recent Labs Lab 03/11/17 1235 03/11/17 1950 03/12/17 0000 03/12/17 0244 03/13/17 0555  WBC 7.8 5.7 13.4* 14.2* 7.2  NEUTROABS 5.0 5.0 11.0*  --    --   HGB 11.2* 11.3* 10.5* 10.2* 9.6*  HCT 31.7* 31.3* 30.0* 29.4* 26.6*  MCV 92.4 91.8 90.6 91.3 92.7  PLT 160 158 136* 127* 235*   Basic Metabolic Panel:  Recent Labs Lab 03/11/17 1235 03/11/17 1950 03/12/17 0244 03/13/17 0555  NA 136 136 138 138  K 3.6 4.2 3.9 3.3*  CL 105 108 111 115*  CO2 26 21* 22 21*  GLUCOSE 123* 117* 141* 93  BUN 21* 18 17 18   CREATININE 1.31* 1.17 1.19 1.05  CALCIUM 9.0 8.3* 8.0* 8.1*   GFR: Estimated Creatinine Clearance: 46.3 mL/min (by C-G formula based on SCr of 1.05 mg/dL). Liver Function Tests:  Recent Labs Lab 03/11/17 1950  AST 36  ALT 14*  ALKPHOS 72  BILITOT 1.7*  PROT 6.5  ALBUMIN 2.6*   No results for input(s): LIPASE, AMYLASE in the last 168 hours. No results for input(s): AMMONIA in the last 168 hours. Coagulation Profile:  Recent Labs Lab 03/11/17 1235 03/11/17 1950  INR 1.08 1.11   Cardiac Enzymes: No results for input(s): CKTOTAL, CKMB, CKMBINDEX, TROPONINI in the last 168 hours. BNP (last 3 results) No results for input(s): PROBNP in the last 8760 hours. HbA1C: No results for input(s): HGBA1C in the last 72 hours. CBG:  Recent Labs Lab 03/12/17 0735 03/13/17 0628  GLUCAP 96 94   Lipid Profile: No results for input(s): CHOL, HDL, LDLCALC, TRIG, CHOLHDL, LDLDIRECT in the last 72 hours. Thyroid Function Tests: No results for input(s): TSH, T4TOTAL, FREET4, T3FREE, THYROIDAB in the last 72 hours. Anemia Panel: No results for input(s): VITAMINB12, FOLATE, FERRITIN, TIBC, IRON, RETICCTPCT in the last 72 hours. Sepsis Labs:  Recent Labs Lab 03/11/17 2003 03/12/17 0000 03/12/17 0244  PROCALCITON  --  15.68  --   LATICACIDVEN 2.18* 1.6 1.7    Recent Results (from the past 240 hour(s))  Culture, blood (Routine x 2)     Status: None (Preliminary result)   Collection Time: 03/11/17  7:49 PM  Result Value Ref Range Status   Specimen Description LEFT ANTECUBITAL  Final   Special Requests   Final     BOTTLES DRAWN AEROBIC AND ANAEROBIC Blood Culture adequate volume   Culture   Final    NO GROWTH 2 DAYS Performed at [facility], [place] [address], [place], [place] [address]    Report Status PENDING  Incomplete  Culture, blood (Routine x 2)     Status: None (Preliminary result)   Collection Time: 03/11/17  7:50 PM  Result Value Ref Range Status   Specimen Description BLOOD RIGHT FOREARM  Final   Special Requests   Final    BOTTLES DRAWN AEROBIC AND ANAEROBIC Blood Culture adequate volume   Culture  Final    NO GROWTH 2 DAYS Performed at [facility], [place] [address], [place], [place] [address]    Report Status PENDING  Incomplete  Urine culture     Status: None (Preliminary result)   Collection Time: 03/11/17  7:58 PM  Result Value Ref Range Status   Specimen Description URINE, SUPRAPUBIC  Final   Special Requests NONE  Final   Culture   Final    CULTURE REINCUBATED FOR BETTER GROWTH Performed at [facility], [place] [address], [place], [place] [address]    Report Status PENDING  Incomplete       Radiology Studies: Dg Chest 2 View  Result Date: 03/11/2017 CLINICAL DATA:  Sepsis and dementia EXAM: CHEST  2 VIEW COMPARISON:  06/26/2014 FINDINGS: The heart size is top normal with aortic atherosclerosis. The patient is status post CABG. Both lungs are clear. There is osteoarthritis of the included right AC and glenohumeral joints. No acute nor suspicious osseous abnormality. IMPRESSION: 1. No active cardiopulmonary disease. 2. Status post CABG. 3. Aortic atherosclerosis. Electronically Signed   By: Ashley Royalty M.D.   On: 03/11/2017 20:53   Ct Abdomen Pelvis W Contrast  Result Date: 03/11/2017 CLINICAL DATA:  Nausea and chills. Suprapubic catheter placement today. EXAM: CT ABDOMEN AND PELVIS WITH CONTRAST TECHNIQUE: Multidetector CT imaging of the abdomen and pelvis was performed using the standard protocol following bolus administration of intravenous contrast.  CONTRAST:  127mL ISOVUE-300 IOPAMIDOL (ISOVUE-300) INJECTION 61% COMPARISON:  CT of the pelvis from earlier today. CT the abdomen and pelvis February 18, 2017 FINDINGS: Lower chest: No acute abnormality. Hepatobiliary: The gallbladder is decompressed and contains stones. No liver lesions are identified. Visualized portal veins are patent. Pancreas: Unremarkable. No pancreatic ductal dilatation or surrounding inflammatory changes. Spleen: Normal in size without focal abnormality. Adrenals/Urinary Tract: Cysts are seen in the right kidney. There are stones in the lower pole with the largest measuring 11 mm. No hydronephrosis on the right. The right ureter is normal in caliber are with no stones. Cysts are seen in the left kidney as well. There is hydronephrosis on the left which is similar in the interval. The left ureter remains dilated as well, also unchanged. No stones are seen in the left ureter. The bladder is decompressed with a newly placed suprapubic catheter. The catheter appears to be in good position. Stones are seen posteriorly in the bladder. There is also some high attenuation on image 72 which could represent streak artifact or even a small amount of blood products from recent catheter placement. Stomach/Bowel: The stomach and small bowel are normal. Colonic diverticulosis is identified. There is some stranding in the pelvis thought to arise from the bladder. This does not appear to be associated with the colon and there is no convincing evidence of diverticulitis. The colon is otherwise unremarkable. The appendix is not visualized but there is no secondary evidence of appendicitis. Vascular/Lymphatic: Atherosclerotic changes are seen in the non aneurysmal aorta. Atherosclerotic changes extend into the iliac femoral vessels. The lymph nodes in the left iliac chain are similar in the interval with a node on image 58 measuring 14 mm in short axis. The other nodes are smaller. Shotty nodes in the  retroperitoneum are stable. A mildly prominent aortocaval node on image 35 is stable. No new adenopathy. Reproductive: The prostate contains brachy therapy seeds. Other: There is stranding in the pelvis which appears to originate from the bladder. This was present in July of 2018 and  earlier today, perhaps due to the interval procedure. Musculoskeletal: No bony changes. IMPRESSION: 1. The patient is status post placement of a suprapubic catheter in the interval. There is stranding around the bladder which is increased since earlier today, probably due to the interval tube placement. 2. Continued left hydronephrosis, unchanged. This could be due to reflux or previous obstruction given the stability. 3. Continued adenopathy in the pelvis, unchanged. 4. Diverticulosis without diverticulitis. 5. Atherosclerosis in the aorta. 6. Cholelithiasis. 7. Nonobstructive stones in the right kidney. Aortic Atherosclerosis (ICD10-I70.0). Electronically Signed   By: Dorise Bullion III M.D   On: 03/11/2017 21:30   Ct Image Guided Drainage Percut Cath  Peritoneal Retroperit  Result Date: 03/11/2017 INDICATION: 83 year old male with history of prostate cancer and urinary retention. Patient has had a Foley catheter since 2015. Patient now needs a suprapubic catheter due to urinary tract infections. EXAM: PLACEMENT OF SUPRAPUBIC BLADDER CATHETER WITH CT GUIDANCE COMPARISON:  None. MEDICATIONS: Ancef 2 g; The antibiotic was administered in an appropriate time frame prior to skin puncture. ANESTHESIA/SEDATION: Fentanyl 50 mcg IV; Versed 1.0 mg IV Moderate Sedation Time:  20 minutes The patient was continuously monitored during the procedure by the interventional radiology nurse under my direct supervision. CONTRAST:  None FLUOROSCOPY TIME:  None COMPLICATIONS: None immediate. PROCEDURE: Informed written consent was obtained from the patient and daughter after a thorough discussion of the procedural risks, benefits and alternatives.  All questions were addressed. A timeout was performed prior to the initiation of the procedure. Patient was placed supine on the CT table. Patient had a Foley catheter in place. Urine was drained from this catheter. 110 mL sterile saline was injected into the catheter in order to distend the decompressed bladder. Images through the pelvis were obtained. The anterior abdomen was prepped and draped in sterile fashion. Skin was anesthetized with 1% lidocaine. 18 gauge trocar needle was directed into the bladder with CT guidance. A stiff Amplatz wire was placed. Tract was dilated to accommodate a 12 [place] multipurpose drain. Catheter was sutured to the skin. Drain attached to gravity bag. FINDINGS: Bladder is mildly distended with 110 cc of saline. Foley catheter in the bladder. Stones within the dependent aspect of the bladder. Pigtail catheter was successfully placed within the bladder. IMPRESSION: Successful CT-guided suprapubic catheter placement. Plan for up sizing of the catheter in 4 weeks. Electronically Signed   By: Markus Daft M.D.   On: 03/11/2017 15:32      Scheduled Meds: . acidophilus  1 capsule Oral BID  . aspirin EC  81 mg Oral Daily  . atorvastatin  40 mg Oral Daily  . cholecalciferol  1,000 Units Oral Daily  . donepezil  5 mg Oral QHS  . omega-3 acid ethyl esters  1 g Oral Daily  . vitamin B-12  1,000 mcg Oral Daily   Continuous Infusions: . ceFEPime (MAXIPIME) IV    . vancomycin Stopped (03/13/17 0104)     LOS: 2 days    Time spent: 36 minutes   Domenic Polite, MD [facility] Page via [email or web address] Password [email or web address] 03/13/2017, 1:22 PM

## 2017-03-13 NOTE — Progress Notes (Signed)
Pharmacy Antibiotic Note  Henry Curtis is a 83 y.o. male with hx prostate cancer s/p surgical intervention with chronic foley since 2015.  He had recent enterococcus and PsA UTI at the end of July.  Patient had suprapubic cath placement on 8/20.  He presented to the ED this evening on 03/11/17 with c/o nausea and chills.  To start broad abx with vancomycin and cefepime for suspected UTI.  Today, 03/13/2017  Day #2 antibiotics  Renal: SCr improved from admission  afebrile  WBC improved to WNL  PCT elevated at admission   Urine cx re-incubated  Plan: - Adjust cefepime to 1 gm IV q12h for h/o pseudomonas - vancomycin 1000 mg IV q24h (goal trough 10-15 mcg/ml) - monitor renal function - check trough if remains on vancomycin  ________________________________  Height: 5\' 10"  (177.8 cm) Weight: 168 lb (76.2 kg) IBW/kg (Calculated) : 73  Temp (24hrs), Avg:98.3 F (36.8 C), Min:98.2 F (36.8 C), Max:98.4 F (36.9 C)   Recent Labs Lab 03/11/17 1235 03/11/17 1950 03/11/17 2003 03/12/17 0000 03/12/17 0244 03/13/17 0555  WBC 7.8 5.7  --  13.4* 14.2* 7.2  CREATININE 1.31* 1.17  --   --  1.19 1.05  LATICACIDVEN  --   --  2.18* 1.6 1.7  --     Estimated Creatinine Clearance: 46.3 mL/min (by C-G formula based on SCr of 1.05 mg/dL).    No Known Allergies  Antimicrobials this admission:  8/20 cefepime>> 8/20 vanc>>  Dose adjustments this admission:   Microbiology results:  7/30 Ucx: enterococcus; PsA FINAL (both pan-sensitive) 8/30 Bcx x2: ngtd 8/20 UCx:  re-incubated (cx may not be reliable with chronic catheter, although catheter was changed at 2pm, cx collected at 8pm)  Thank you for allowing pharmacy to be a part of this patient's care.  Doreene Eland, PharmD, BCPS.   Pager: [phone] 03/13/2017 10:45 AM

## 2017-03-13 NOTE — Evaluation (Addendum)
Physical Therapy Evaluation Patient Details Name: Henry Curtis MRN: [id number] DOB: 23-Apr-1922 Today's Date: 03/13/2017   History of Present Illness  83 y.o. male with medical history significant of prostate cancer (s/p of radiation seed placement), dementia, hypertension, hyperlipidemia, CAD, CABG, C. difficile colitis, hearing loss, s/p of suprapubic catheter placement, sCHF with EF 45-50, CKD-3, who presents with fever and chills following placement of suprapubic catheter 03/11/17.   Clinical Impression  Pt admitted with above diagnosis. Pt currently with functional limitations due to the deficits listed below (see PT Problem List). Pt ambulated 56' with RW, no loss of balance, but required supervision for safety. Good progress expected.  Pt will benefit from skilled PT to increase their independence and safety with mobility to allow discharge to the venue listed below.       Follow Up Recommendations Home health PT    Equipment Recommendations  None recommended by PT    Recommendations for Other Services       Precautions / Restrictions Precautions Precautions: Fall Precaution Comments: denies h/o falls in past 1 year Restrictions Weight Bearing Restrictions: No      Mobility  Bed Mobility Overal bed mobility: Needs Assistance Bed Mobility: Rolling;Sidelying to Sit Rolling: Min assist Sidelying to sit: Min assist       General bed mobility comments: min A to initiate movement and to raise trunk  Transfers Overall transfer level: Needs assistance Equipment used: Rolling walker (2 wheeled) Transfers: Sit to/from Stand Sit to Stand: Min guard            Ambulation/Gait Ambulation/Gait assistance: Min guard Ambulation Distance (Feet): 80 Feet Assistive device: Rolling walker (2 wheeled) Gait Pattern/deviations: Step-through pattern;Decreased step length - right;Decreased step length - left;Trunk flexed     General Gait Details: VCs for proximity to RW,  steady, no LOB, HR 70s while walking  Stairs            Wheelchair Mobility    Modified Rankin (Stroke Patients Only)       Balance Overall balance assessment: Modified Independent                                           Pertinent Vitals/Pain Pain Assessment: No/denies pain    Home Living Family/patient expects to be discharged to:: Private residence Living Arrangements: Alone Available Help at Discharge: Personal care attendant;Available 24 hours/day Type of Home: apartment Home Access: Level entry     Home Layout: One level Home Equipment: [place] - 2 wheels;Cane - single point;Bedside commode;Tub bench      Prior Function Level of Independence: Independent with assistive device(s)         Comments: aides come daily, pt walked with SPC until 1 month ago then started using RW 2* weakness, stated he rarely needs help to bathe/dress; aides assist with meals/housework     Hand Dominance        Extremity/Trunk Assessment   Upper Extremity Assessment Upper Extremity Assessment: Overall WFL for tasks assessed    Lower Extremity Assessment Lower Extremity Assessment: Overall WFL for tasks assessed (B knee extension -15* AROM; B knee ext +4/5)    Cervical / Trunk Assessment Cervical / Trunk Assessment: Normal  Communication   Communication: HOH  Cognition Arousal/Alertness: Awake/alert Behavior During Therapy: WFL for tasks assessed/performed Overall Cognitive Status: History of cognitive impairments - at baseline  General Comments: mild memory loss      General Comments      Exercises     Assessment/Plan    PT Assessment Patient needs continued PT services  PT Problem List Decreased range of motion;Decreased activity tolerance;Decreased mobility;Decreased knowledge of use of DME       PT Treatment Interventions DME instruction;Gait training;Functional mobility  training;Therapeutic exercise;Patient/family education;Therapeutic activities    PT Goals (Current goals can be found in the Care Plan section)  Acute Rehab PT Goals Patient Stated Goal: to get stronger PT Goal Formulation: With patient/family Time For Goal Achievement: 03/27/17 Potential to Achieve Goals: Good    Frequency Min 3X/week   Barriers to discharge        Co-evaluation               AM-PAC PT "6 Clicks" Daily Activity  Outcome Measure Difficulty turning over in bed (including adjusting bedclothes, sheets and blankets)?: A Lot Difficulty moving from lying on back to sitting on the side of the bed? : Unable Difficulty sitting down on and standing up from a chair with arms (e.g., wheelchair, bedside commode, etc,.)?: Unable Help needed moving to and from a bed to chair (including a wheelchair)?: A Little Help needed walking in hospital room?: A Little Help needed climbing 3-5 steps with a railing? : A Little 6 Click Score: 13    End of Session Equipment Utilized During Treatment: Gait belt Activity Tolerance: Patient tolerated treatment well Patient left: in chair;with call bell/phone within reach;with family/visitor present;with chair alarm set Nurse Communication: Mobility status PT Visit Diagnosis: Difficulty in walking, not elsewhere classified (R26.2)    Time: 6761-9509 PT Time Calculation (min) (ACUTE ONLY): 29 min   Charges:   PT Evaluation $PT Eval Low Complexity: 1 Low PT Treatments $Gait Training: 8-22 mins   PT G Codes:          Philomena Doheny 03/13/2017, 9:21 AM [phone]

## 2017-03-14 LAB — CBC
HCT: 30 % — ABNORMAL LOW (ref 39.0–52.0)
HEMOGLOBIN: 10.7 g/dL — AB (ref 13.0–17.0)
MCH: 32.8 pg (ref 26.0–34.0)
MCHC: 35.7 g/dL (ref 30.0–36.0)
MCV: 92 fL (ref 78.0–100.0)
PLATELETS: 134 10*3/uL — AB (ref 150–400)
RBC: 3.26 MIL/uL — ABNORMAL LOW (ref 4.22–5.81)
RDW: 14.1 % (ref 11.5–15.5)
WBC: 7.2 10*3/uL (ref 4.0–10.5)

## 2017-03-14 LAB — BASIC METABOLIC PANEL
Anion gap: 5 (ref 5–15)
BUN: 12 mg/dL (ref 6–20)
CALCIUM: 8.4 mg/dL — AB (ref 8.9–10.3)
CHLORIDE: 111 mmol/L (ref 101–111)
CO2: 22 mmol/L (ref 22–32)
CREATININE: 0.97 mg/dL (ref 0.61–1.24)
Glucose, Bld: 101 mg/dL — ABNORMAL HIGH (ref 65–99)
Potassium: 3.6 mmol/L (ref 3.5–5.1)
SODIUM: 138 mmol/L (ref 135–145)

## 2017-03-14 LAB — GLUCOSE, CAPILLARY: GLUCOSE-CAPILLARY: 93 mg/dL (ref 65–99)

## 2017-03-14 MED ORDER — ENOXAPARIN SODIUM 40 MG/0.4ML ~~LOC~~ SOLN
40.0000 mg | SUBCUTANEOUS | Status: DC
  Administered 2017-03-14: 40 mg via SUBCUTANEOUS
  Filled 2017-03-14: qty 0.4

## 2017-03-14 NOTE — Progress Notes (Signed)
Spoke with pt's Henry Curtis daughter at bedside who selected [facility] for [facility] and [facility]. Will need orders and face to face please.

## 2017-03-14 NOTE — Progress Notes (Signed)
Physical Therapy Treatment Patient Details Name: Henry Curtis MRN: [id number] DOB: 16-Sep-1921 Today's Date: 03/14/2017    History of Present Illness 83 y.o. male with medical history significant of prostate cancer (s/p of radiation seed placement), dementia, hypertension, hyperlipidemia, CAD, CABG, C. difficile colitis, hearing loss, s/p of suprapubic catheter placement, sCHF with EF 45-50, CKD-3, who presents with fever and chills following placement of suprapubic catheter 03/11/17.     PT Comments    Pt progressing well with mobility, decreased assistance required today for bed mobility. He ambulated 85' with RW without loss of balance. Daughter present for treatment.   Follow Up Recommendations  Home health PT     Equipment Recommendations  None recommended by PT    Recommendations for Other Services       Precautions / Restrictions Precautions Precautions: Fall Precaution Comments: denies h/o falls in past 1 year Restrictions Weight Bearing Restrictions: No    Mobility  Bed Mobility Overal bed mobility: Needs Assistance Bed Mobility: Rolling;Sidelying to Sit Rolling: Supervision Sidelying to sit: Min assist       General bed mobility comments: VCs for log roll technique  Transfers Overall transfer level: Needs assistance Equipment used: Rolling walker (2 wheeled) Transfers: Sit to/from Stand Sit to Stand: Min guard         General transfer comment: VCs hand placement  Ambulation/Gait Ambulation/Gait assistance: Min guard Ambulation Distance (Feet): 90 Feet Assistive device: Rolling walker (2 wheeled) Gait Pattern/deviations: Step-through pattern;Decreased step length - right;Decreased step length - left;Trunk flexed   Gait velocity interpretation: at or above normal speed for age/gender General Gait Details: VCs for proximity to RW and for posture, steady, no LOB   Stairs            Wheelchair Mobility    Modified Rankin (Stroke Patients  Only)       Balance Overall balance assessment: Modified Independent                                          Cognition Arousal/Alertness: Awake/alert Behavior During Therapy: WFL for tasks assessed/performed Overall Cognitive Status: History of cognitive impairments - at baseline                                 General Comments: mild memory loss per daughter      Exercises      General Comments        Pertinent Vitals/Pain Pain Assessment: No/denies pain    Home Living                      Prior Function            PT Goals (current goals can now be found in the care plan section) Acute Rehab PT Goals Patient Stated Goal: to get stronger PT Goal Formulation: With patient/family Time For Goal Achievement: 03/27/17 Potential to Achieve Goals: Good Progress towards PT goals: Progressing toward goals    Frequency    Min 3X/week      PT Plan Current plan remains appropriate    Co-evaluation              AM-PAC PT "6 Clicks" Daily Activity  Outcome Measure  Difficulty turning over in bed (including adjusting bedclothes, sheets and blankets)?: A Lot Difficulty moving from lying on  back to sitting on the side of the bed? : Unable Difficulty sitting down on and standing up from a chair with arms (e.g., wheelchair, bedside commode, etc,.)?: Unable Help needed moving to and from a bed to chair (including a wheelchair)?: A Little Help needed walking in hospital room?: A Little Help needed climbing 3-5 steps with a railing? : A Little 6 Click Score: 13    End of Session Equipment Utilized During Treatment: Gait belt Activity Tolerance: Patient tolerated treatment well Patient left: in chair;with call bell/phone within reach;with family/visitor present;with chair alarm set Nurse Communication: Mobility status PT Visit Diagnosis: Difficulty in walking, not elsewhere classified (R26.2)     Time: 7209-4709 PT  Time Calculation (min) (ACUTE ONLY): 12 min  Charges:  $Gait Training: 8-22 mins                    G Codes:          Philomena Doheny 03/14/2017, 12:50 PM [phone]

## 2017-03-14 NOTE — Progress Notes (Signed)
PROGRESS NOTE    LEJUAN BOTTO  SPQ:[id number] DOB: Oct 13, 1921 DOA: 03/11/2017 PCP: Deland Pretty, MD     Brief Narrative:  Henry Curtis is a 83 y.o. male with medical history significant of prostate cancer (s/p of radiation seed placement), dementia, hypertension, hyperlipidemia, CAD, CABG, hx C. difficile colitis, hearing loss, s/p of suprapubic catheter placement, sCHF with EF 45-50, CKD-3, who presents with fever and chills. Pt has history of prostate cancer and chronic urinary tract obstruction requiring Foley catheter for the last few years. He had UTI in July which was treated with Keflex. He underwent suprapubic cath placement on 8/20 by Dr. Anselm Pancoast of IR. On his way home, he developed fever, chills, generalized weakness so daughter brought him to ED.   Assessment & Plan:   Severe sepsis secondary to complicated UTI, POA, with chronic foley (removed) and new suprapubic cath in place  -Previous urine culture on 7/30 with pansensitive pseudomonas, entercoccus.   -clinically improving, continue cefepime  -Blood culture, urine culture with >100K GNR, stop Vanc -stop IVF -sepsis physiology resolved   Sinus bradycardia -Hold metoprolol  Essential HTN -Low BP overnight. Hold cozaar, metoprolol   CAD -Continue aspirin, Lipitor  Hx of prostate cancer -S/p of radiation seeds placement. Has chronic obstruction with chronic foley.  -Suprapubic catheter placement 8/20 -FU with Urology  CKD stage 3 -Baseline Cr 1-1.3 -Stable  Chronic systolic congestive heart failure -Stable and euvolemic   Dementia -Continue aricept   DVT prophylaxis: lovenox Code Status: DNR Family Communication: daughter at bedside Disposition Plan: Pending improvement and Cx data   Consultants:   Dr.Choi left voicemail for Dr. Acquanetta Sit nurse to let him know of patient's admission, per family request. No formal consult placed   Notified Dr.Dahlstedt via Epic msg  Procedures:   None    Antimicrobials:  Anti-infectives    Start     Dose/Rate Route Frequency Ordered Stop   03/13/17 1200  ceFEPIme (MAXIPIME) 1 g in dextrose 5 % 50 mL IVPB     1 g 100 mL/hr over 30 Minutes Intravenous Every 12 hours 03/13/17 1040     03/12/17 2000  ceFEPIme (MAXIPIME) 2 g in dextrose 5 % 50 mL IVPB  Status:  Discontinued     2 g 100 mL/hr over 30 Minutes Intravenous Every 24 hours 03/11/17 2302 03/13/17 1040   03/11/17 2330  vancomycin (VANCOCIN) IVPB 1000 mg/200 mL premix  Status:  Discontinued     1,000 mg 200 mL/hr over 60 Minutes Intravenous Every 24 hours 03/11/17 2302 03/14/17 1219   03/11/17 2000  ceFEPIme (MAXIPIME) 2 g in dextrose 5 % 50 mL IVPB     2 g 100 mL/hr over 30 Minutes Intravenous  Once 03/11/17 1957 03/11/17 2333       Subjective: -feels well, no complaints  Objective: Vitals:   03/13/17 1446 03/13/17 2037 03/14/17 0412 03/14/17 1225  BP: (!) 119/45 (!) 129/55 137/61 132/60  Pulse: (!) 58 (!) 55 (!) 58 63  Resp: 18 18 18 16   Temp: 98.6 F (37 C) 98.2 F (36.8 C) 97.6 F (36.4 C) 98.6 F (37 C)  TempSrc: Oral Oral Oral Oral  SpO2: 100% 96% 98% 99%  Weight:      Height:        Intake/Output Summary (Last 24 hours) at 03/14/17 1329 Last data filed at 03/14/17 1006  Gross per 24 hour  Intake              300 ml  Output             2500 ml  Net            -2200 ml   Filed Weights   03/11/17 1956  Weight: 76.2 kg (168 lb)    Examination:   Gen: Awake, Alert, Oriented X 3,  No distress HEENT: PERRLA, Neck supple, no JVD Lungs: Good air movement bilaterally, CTAB CVS: RRR,No Gallops,Rubs or new Murmurs Abd: soft, Non tender, non distended, BS present, suprapubic catheter nited Extremities: No Cyanosis, Clubbing or edema Skin: no new rashes  Data Reviewed: I have personally reviewed following labs and imaging studies  CBC:  Recent Labs Lab 03/11/17 1235 03/11/17 1950 03/12/17 0000 03/12/17 0244 03/13/17 0555 03/14/17 0609  WBC 7.8  5.7 13.4* 14.2* 7.2 7.2  NEUTROABS 5.0 5.0 11.0*  --   --   --   HGB 11.2* 11.3* 10.5* 10.2* 9.6* 10.7*  HCT 31.7* 31.3* 30.0* 29.4* 26.6* 30.0*  MCV 92.4 91.8 90.6 91.3 92.7 92.0  PLT 160 158 136* 127* 115* 222*   Basic Metabolic Panel:  Recent Labs Lab 03/11/17 1235 03/11/17 1950 03/12/17 0244 03/13/17 0555 03/14/17 0609  NA 136 136 138 138 138  K 3.6 4.2 3.9 3.3* 3.6  CL 105 108 111 115* 111  CO2 26 21* 22 21* 22  GLUCOSE 123* 117* 141* 93 101*  BUN 21* 18 17 18 12   CREATININE 1.31* 1.17 1.19 1.05 0.97  CALCIUM 9.0 8.3* 8.0* 8.1* 8.4*   GFR: Estimated Creatinine Clearance: 50.2 mL/min (by C-G formula based on SCr of 0.97 mg/dL). Liver Function Tests:  Recent Labs Lab 03/11/17 1950  AST 36  ALT 14*  ALKPHOS 72  BILITOT 1.7*  PROT 6.5  ALBUMIN 2.6*   No results for input(s): LIPASE, AMYLASE in the last 168 hours. No results for input(s): AMMONIA in the last 168 hours. Coagulation Profile:  Recent Labs Lab 03/11/17 1235 03/11/17 1950  INR 1.08 1.11   Cardiac Enzymes: No results for input(s): CKTOTAL, CKMB, CKMBINDEX, TROPONINI in the last 168 hours. BNP (last 3 results) No results for input(s): PROBNP in the last 8760 hours. HbA1C: No results for input(s): HGBA1C in the last 72 hours. CBG:  Recent Labs Lab 03/12/17 0735 03/13/17 0628 03/14/17 0659  GLUCAP 96 94 93   Lipid Profile: No results for input(s): CHOL, HDL, LDLCALC, TRIG, CHOLHDL, LDLDIRECT in the last 72 hours. Thyroid Function Tests: No results for input(s): TSH, T4TOTAL, FREET4, T3FREE, THYROIDAB in the last 72 hours. Anemia Panel: No results for input(s): VITAMINB12, FOLATE, FERRITIN, TIBC, IRON, RETICCTPCT in the last 72 hours. Sepsis Labs:  Recent Labs Lab 03/11/17 2003 03/12/17 0000 03/12/17 0244  PROCALCITON  --  15.68  --   LATICACIDVEN 2.18* 1.6 1.7    Recent Results (from the past 240 hour(s))  Culture, blood (Routine x 2)     Status: None (Preliminary result)    Collection Time: 03/11/17  7:49 PM  Result Value Ref Range Status   Specimen Description LEFT ANTECUBITAL  Final   Special Requests   Final    BOTTLES DRAWN AEROBIC AND ANAEROBIC Blood Culture adequate volume   Culture   Final    NO GROWTH 3 DAYS Performed at [facility], [place] [address], [place], [place] [address]    Report Status PENDING  Incomplete  Culture, blood (Routine x 2)     Status: None (Preliminary result)   Collection Time: 03/11/17  7:50 PM  Result  Value Ref Range Status   Specimen Description BLOOD RIGHT FOREARM  Final   Special Requests   Final    BOTTLES DRAWN AEROBIC AND ANAEROBIC Blood Culture adequate volume   Culture   Final    NO GROWTH 3 DAYS Performed at [facility], [address] [place], [place] [address]    Report Status PENDING  Incomplete  Urine culture     Status: Abnormal (Preliminary result)   Collection Time: 03/11/17  7:58 PM  Result Value Ref Range Status   Specimen Description URINE, SUPRAPUBIC  Final   Special Requests NONE  Final   Culture (A)  Final    >=100,000 COLONIES/mL STAPHYLOCOCCUS AUREUS >=100,000 COLONIES/mL ESCHERICHIA COLI >=100,000 COLONIES/mL ENTEROCOCCUS FAECALIS SUSCEPTIBILITIES TO FOLLOW Performed at [facility], [place] [address] [place], [place] [address]    Report Status PENDING  Incomplete       Radiology Studies: No results found.    Scheduled Meds: . acidophilus  1 capsule Oral BID  . aspirin EC  81 mg Oral Daily  . atorvastatin  40 mg Oral Daily  . cholecalciferol  1,000 Units Oral Daily  . donepezil  5 mg Oral QHS  . enoxaparin (LOVENOX) injection  40 mg Subcutaneous Q24H  . omega-3 acid ethyl esters  1 g Oral Daily  . vitamin B-12  1,000 mcg Oral Daily   Continuous Infusions: . ceFEPime (MAXIPIME) IV Stopped (03/14/17 1007)     LOS: 3 days    Time spent: 104 minutes   Domenic Polite, MD [facility] Page via [email or web address] Password [email or web address] 03/14/2017, 1:29 PM

## 2017-03-15 LAB — CBC
HEMATOCRIT: 29.6 % — AB (ref 39.0–52.0)
HEMOGLOBIN: 10.5 g/dL — AB (ref 13.0–17.0)
MCH: 32.5 pg (ref 26.0–34.0)
MCHC: 35.5 g/dL (ref 30.0–36.0)
MCV: 91.6 fL (ref 78.0–100.0)
Platelets: 142 10*3/uL — ABNORMAL LOW (ref 150–400)
RBC: 3.23 MIL/uL — ABNORMAL LOW (ref 4.22–5.81)
RDW: 14.1 % (ref 11.5–15.5)
WBC: 6.7 10*3/uL (ref 4.0–10.5)

## 2017-03-15 LAB — URINE CULTURE

## 2017-03-15 LAB — GLUCOSE, CAPILLARY: Glucose-Capillary: 125 mg/dL — ABNORMAL HIGH (ref 65–99)

## 2017-03-15 MED ORDER — CIPROFLOXACIN HCL 250 MG PO TABS: 250.0000 mg | ORAL_TABLET | Freq: Two times a day (BID) | ORAL | 0 refills | Status: DC

## 2017-03-15 MED ORDER — CEPHALEXIN 250 MG PO CAPS
250.0000 mg | ORAL_CAPSULE | Freq: Four times a day (QID) | ORAL | Status: DC
  Administered 2017-03-15: 250 mg via ORAL
  Filled 2017-03-15 (×2): qty 1

## 2017-03-15 MED ORDER — CEPHALEXIN 250 MG PO CAPS: 250.0000 mg | ORAL_CAPSULE | Freq: Four times a day (QID) | ORAL | 0 refills | Status: DC

## 2017-03-15 MED ORDER — CIPROFLOXACIN HCL 250 MG PO TABS
250.0000 mg | ORAL_TABLET | Freq: Two times a day (BID) | ORAL | Status: DC
  Administered 2017-03-15: 250 mg via ORAL
  Filled 2017-03-15: qty 1

## 2017-03-15 NOTE — Discharge Summary (Signed)
Physician Discharge Summary  Henry Curtis:[id number] DOB: [birth year] DOA: 03/11/2017  PCP: Deland Pretty, MD  Admit date: 03/11/2017 Discharge date: 03/15/2017  Time spent: 35 minutes  Recommendations for Outpatient Follow-up:  Dr.Dahlstedt in 1 week,-office will call PCP Dr.Pharr in 2 weeks Home health PT/RN  Discharge Diagnoses:  Principal Problem:   Complicated UTI (urinary tract infection)   Bladder outlet obstruction/BPH with suprapubic catheter   Essential hypertension   CAD - s/p CABP 12/1989; LIMA-D1-LAD, SVG-rPDA (PDA Endarterectomy & patch andioplasty)   Hyperlipidemia   Alzheimer disease   Sepsis ([place])   CKD (chronic kidney disease), stage III   Acute metabolic encephalopathy   Chronic systolic CHF (congestive heart failure) ([place])   Discharge Condition: stable  Diet recommendation: heart healthy  Filed Weights   03/11/17 1956  Weight: 76.2 kg (168 lb)    History of present illness:  Henry Ballin Smithis a 83 y.o.malewith medical history significant of prostate cancer (s/p of radiation seed placement), dementia, hypertension, hyperlipidemia, CAD, CABG, hx C. difficile colitis, hearing loss, s/p of suprapubic catheter placement, sCHF with EF 45-50, CKD-3, who presents with fever and chills. Pt has history of prostate cancer and chronic urinary tract obstruction requiring Foley catheter for the last few years. He had UTI in July which was treated with Keflex. He underwent suprapubic cath placement on 8/20 by Dr. Anselm Pancoast of IR. On his way home, he developed fever, chills, generalized weakness so daughter brought him to ED  Hospital Course:  Severe sepsis secondary to complicated UTI, POA, with chronic foley (removed) and new suprapubic cath in place  -sepsis physiology resolved  -improved with broad spectrum Abx -Previous urine culture on 7/30 with pansensitive pseudomonas, entercoccus.  -urine cx grew Ecoli, Enterococcus fecalis, MSSA -changed to Po keflex and  Ciprofloxacin  -clinically improved, discharged home in a stable condition -case discussed with Dr.Dahlstedt, he will FU with with pt in 1 week'  Sinus bradycardia -persistent and hence stopped metoprolol -HR in 50-60 range and stable now off metoprolol  Essential HTN- -stable, resumed losartan  CAD -Continue aspirin, Lipitor, BB stopped due to persistent severe bradycardia  Hx of prostate cancer -S/p of radiation seeds placement. Has chronic obstruction with chronic foley.  -s/p Suprapubic catheter placement 8/20 -FU with Urology  CKD stage 3 -Baseline Cr 1-1.3 -Stable  Chronic systolic congestive heart failure -Stable and euvolemic   Dementia -Continue aricept   Consultations:  D/w Dr.Dahlstedt-Urology  Discharge Exam: Vitals:   03/15/17 0800 03/15/17 1304  BP: (!) 119/50 (!) 141/53  Pulse: 61 69  Resp: 18   Temp: (!) 97.5 F (36.4 C) 98.1 F (36.7 C)  SpO2: 98% 100%    General: AAOx3 Cardiovascular: S1S2/RRR Respiratory: CTAB  Discharge Instructions   Discharge Instructions    Diet - low sodium heart healthy    Complete by:  As directed    Increase activity slowly    Complete by:  As directed      Current Discharge Medication List    START taking these medications   Details  ciprofloxacin (CIPRO) 250 MG tablet Take 1 tablet (250 mg total) by mouth 2 (two) times daily. For 3days Qty: 6 tablet, Refills: 0      CONTINUE these medications which have CHANGED   Details  cephALEXin (KEFLEX) 250 MG capsule Take 1 capsule (250 mg total) by mouth 4 (four) times daily. For 3days Qty: 12 capsule, Refills: 0      CONTINUE these medications which have NOT CHANGED  Details  aspirin 81 MG tablet Take 81 mg by mouth daily.    atorvastatin (LIPITOR) 40 MG tablet Take 40 mg by mouth daily.    Cholecalciferol (VITAMIN D-3 PO) Take 1,000 Units by mouth daily.     Cyanocobalamin (VITAMIN B-12 PO) Take 1,000 mcg by mouth daily.     donepezil  (ARICEPT) 10 MG tablet Take 1 tablet (10 mg total) by mouth at bedtime. Qty: 30 tablet, Refills: 5    finasteride (PROSCAR) 5 MG tablet Take 5 mg by mouth daily.    losartan (COZAAR) 100 MG tablet Take 100 mg by mouth daily.    Melatonin 10 MG TABS Take 10 mg by mouth at bedtime.     Omega-3 Fatty Acids (FISH OIL) 1200 MG CAPS Take 1,200 mg by mouth daily.    Probiotic Product (PROBIOTIC & ACIDOPHILUS EX ST PO) Take 1 tablet by mouth 2 (two) times daily.       STOP taking these medications     metoprolol tartrate (LOPRESSOR) 25 MG tablet        No Known Allergies    The results of significant diagnostics from this hospitalization (including imaging, microbiology, ancillary and laboratory) are listed below for reference.    Significant Diagnostic Studies: Dg Chest 2 View  Result Date: 03/11/2017 CLINICAL DATA:  Sepsis and dementia EXAM: CHEST  2 VIEW COMPARISON:  06/26/2014 FINDINGS: The heart size is top normal with aortic atherosclerosis. The patient is status post CABG. Both lungs are clear. There is osteoarthritis of the included right AC and glenohumeral joints. No acute nor suspicious osseous abnormality. IMPRESSION: 1. No active cardiopulmonary disease. 2. Status post CABG. 3. Aortic atherosclerosis. Electronically Signed   By: Ashley Royalty M.D.   On: 03/11/2017 20:53   Ct Abdomen Pelvis W Contrast  Result Date: 03/11/2017 CLINICAL DATA:  Nausea and chills. Suprapubic catheter placement today. EXAM: CT ABDOMEN AND PELVIS WITH CONTRAST TECHNIQUE: Multidetector CT imaging of the abdomen and pelvis was performed using the standard protocol following bolus administration of intravenous contrast. CONTRAST:  142mL ISOVUE-300 IOPAMIDOL (ISOVUE-300) INJECTION 61% COMPARISON:  CT of the pelvis from earlier today. CT the abdomen and pelvis February 18, 2017 FINDINGS: Lower chest: No acute abnormality. Hepatobiliary: The gallbladder is decompressed and contains stones. No liver lesions are  identified. Visualized portal veins are patent. Pancreas: Unremarkable. No pancreatic ductal dilatation or surrounding inflammatory changes. Spleen: Normal in size without focal abnormality. Adrenals/Urinary Tract: Cysts are seen in the right kidney. There are stones in the lower pole with the largest measuring 11 mm. No hydronephrosis on the right. The right ureter is normal in caliber are with no stones. Cysts are seen in the left kidney as well. There is hydronephrosis on the left which is similar in the interval. The left ureter remains dilated as well, also unchanged. No stones are seen in the left ureter. The bladder is decompressed with a newly placed suprapubic catheter. The catheter appears to be in good position. Stones are seen posteriorly in the bladder. There is also some high attenuation on image 72 which could represent streak artifact or even a small amount of blood products from recent catheter placement. Stomach/Bowel: The stomach and small bowel are normal. Colonic diverticulosis is identified. There is some stranding in the pelvis thought to arise from the bladder. This does not appear to be associated with the colon and there is no convincing evidence of diverticulitis. The colon is otherwise unremarkable. The appendix is not visualized but there  is no secondary evidence of appendicitis. Vascular/Lymphatic: Atherosclerotic changes are seen in the non aneurysmal aorta. Atherosclerotic changes extend into the iliac femoral vessels. The lymph nodes in the left iliac chain are similar in the interval with a node on image 58 measuring 14 mm in short axis. The other nodes are smaller. Shotty nodes in the retroperitoneum are stable. A mildly prominent aortocaval node on image 35 is stable. No new adenopathy. Reproductive: The prostate contains brachy therapy seeds. Other: There is stranding in the pelvis which appears to originate from the bladder. This was present in July of 2018 and earlier today,  perhaps due to the interval procedure. Musculoskeletal: No bony changes. IMPRESSION: 1. The patient is status post placement of a suprapubic catheter in the interval. There is stranding around the bladder which is increased since earlier today, probably due to the interval tube placement. 2. Continued left hydronephrosis, unchanged. This could be due to reflux or previous obstruction given the stability. 3. Continued adenopathy in the pelvis, unchanged. 4. Diverticulosis without diverticulitis. 5. Atherosclerosis in the aorta. 6. Cholelithiasis. 7. Nonobstructive stones in the right kidney. Aortic Atherosclerosis (ICD10-I70.0). Electronically Signed   By: Dorise Bullion III M.D   On: 03/11/2017 21:30   Ct Renal Stone Study  Result Date: 02/18/2017 CLINICAL DATA:  Right flank pain for a few hours. Foley catheter in place. History of previous kidney stones, C difficile, prostate cancer, kidney stones, urinary tract infections, hypertension, and appendectomy. EXAM: CT ABDOMEN AND PELVIS WITHOUT CONTRAST TECHNIQUE: Multidetector CT imaging of the abdomen and pelvis was performed following the standard protocol without IV contrast. COMPARISON:  12/21/2014 FINDINGS: Lower chest: Slight fibrosis in the lung bases. Postoperative changes in the mediastinum. Hepatobiliary: Cholelithiasis with contracted gallbladder. No inflammatory infiltration. No bile duct dilatation. No focal liver lesions on noncontrast imaging. Pancreas: Unremarkable. No pancreatic ductal dilatation or surrounding inflammatory changes. Spleen: Normal in size without focal abnormality. Adrenals/Urinary Tract: Prominent left hydronephrosis and hydroureter. No ureteral stones are demonstrated. This could represent an occult non radiopaque stone, stricture, or sequela of recently passed stone, or reflux changes. Two large stones in the lower pole right kidney, largest measuring 11 mm maximal diameter. Stone is enlarged since previous study. No  hydronephrosis, hydroureter, or ureteral stone on the right. Bilateral renal cysts. Bladder is decompressed with a Foley catheter in place. There is a amorphous increased density material within the bladder. This could represent contrast material, hemorrhage, or bladder stones. The shape is less likely to represent stone. Stomach/Bowel: Stomach, small bowel, and colon are partially gas and fluid-filled without distention. Scattered stool throughout the colon. Colonic diverticula without inflammatory change. Appendix is surgically absent by history. Vascular/Lymphatic: Aortic atherosclerosis. No enlarged abdominal or pelvic lymph nodes. Reproductive: Prostate seed implants are present consistent with history of treated prostate cancer. There is are enlarged left iliac chain lymph nodes measuring up to about 14 mm diameter. This has progressed since previous study. This could represent inflammatory node or metastatic disease. Other: No abdominal wall hernia or abnormality. No abdominopelvic ascites. Musculoskeletal: Degenerative changes in the spine. No destructive or sclerotic bone lesions appreciated. IMPRESSION: 1. Left hydronephrosis and hydroureter. No ureteral stones are demonstrated. This could represent occult non radiopaque stone, stricture, reflux changes or sequela of recently passed stone. 2. Nonobstructing intrarenal stones on the right kidney. No right ureteral stones. 3. Bladder is decompressed with a Foley catheter. Increased density material in the bladder may represent contrast material, hemorrhage, or, less likely, bladder stones. 4. Prostate seed  implants. Enlarged left iliac chain lymph nodes are progressing since previous study and could represent metastatic disease. 5. Aortic atherosclerosis. Electronically Signed   By: Lucienne Capers M.D.   On: 02/18/2017 01:46   Ct Image Guided Drainage Percut Cath  Peritoneal Retroperit  Result Date: 03/11/2017 INDICATION: 83 year old male with history  of prostate cancer and urinary retention. Patient has had a Foley catheter since 2015. Patient now needs a suprapubic catheter due to urinary tract infections. EXAM: PLACEMENT OF SUPRAPUBIC BLADDER CATHETER WITH CT GUIDANCE COMPARISON:  None. MEDICATIONS: Ancef 2 g; The antibiotic was administered in an appropriate time frame prior to skin puncture. ANESTHESIA/SEDATION: Fentanyl 50 mcg IV; Versed 1.0 mg IV Moderate Sedation Time:  20 minutes The patient was continuously monitored during the procedure by the interventional radiology nurse under my direct supervision. CONTRAST:  None FLUOROSCOPY TIME:  None COMPLICATIONS: None immediate. PROCEDURE: Informed written consent was obtained from the patient and daughter after a thorough discussion of the procedural risks, benefits and alternatives. All questions were addressed. A timeout was performed prior to the initiation of the procedure. Patient was placed supine on the CT table. Patient had a Foley catheter in place. Urine was drained from this catheter. 110 mL sterile saline was injected into the catheter in order to distend the decompressed bladder. Images through the pelvis were obtained. The anterior abdomen was prepped and draped in sterile fashion. Skin was anesthetized with 1% lidocaine. 18 gauge trocar needle was directed into the bladder with CT guidance. A stiff Amplatz wire was placed. Tract was dilated to accommodate a 12 [place] multipurpose drain. Catheter was sutured to the skin. Drain attached to gravity bag. FINDINGS: Bladder is mildly distended with 110 cc of saline. Foley catheter in the bladder. Stones within the dependent aspect of the bladder. Pigtail catheter was successfully placed within the bladder. IMPRESSION: Successful CT-guided suprapubic catheter placement. Plan for up sizing of the catheter in 4 weeks. Electronically Signed   By: Markus Daft M.D.   On: 03/11/2017 15:32    Microbiology: Recent Results (from the past 240 hour(s))   Culture, blood (Routine x 2)     Status: None (Preliminary result)   Collection Time: 03/11/17  7:49 PM  Result Value Ref Range Status   Specimen Description LEFT ANTECUBITAL  Final   Special Requests   Final    BOTTLES DRAWN AEROBIC AND ANAEROBIC Blood Culture adequate volume   Culture   Final    NO GROWTH 3 DAYS Performed at [facility], [address], [place], [place] [address]    Report Status PENDING  Incomplete  Culture, blood (Routine x 2)     Status: None (Preliminary result)   Collection Time: 03/11/17  7:50 PM  Result Value Ref Range Status   Specimen Description BLOOD RIGHT FOREARM  Final   Special Requests   Final    BOTTLES DRAWN AEROBIC AND ANAEROBIC Blood Culture adequate volume   Culture   Final    NO GROWTH 3 DAYS Performed at [facility], [place] [address] [place], [place] [address]    Report Status PENDING  Incomplete  Urine culture     Status: Abnormal   Collection Time: 03/11/17  7:58 PM  Result Value Ref Range Status   Specimen Description URINE, SUPRAPUBIC  Final   Special Requests NONE  Final   Culture (A)  Final    >=100,000 COLONIES/mL STAPHYLOCOCCUS AUREUS >=100,000 COLONIES/mL ESCHERICHIA COLI >=100,000 COLONIES/mL ENTEROCOCCUS FAECALIS  Report Status 03/15/2017 FINAL  Final   Organism ID, Bacteria STAPHYLOCOCCUS AUREUS (A)  Final   Organism ID, Bacteria ESCHERICHIA COLI (A)  Final   Organism ID, Bacteria ENTEROCOCCUS FAECALIS (A)  Final      Susceptibility   Escherichia coli - MIC*    AMPICILLIN >=32 RESISTANT Resistant     CEFAZOLIN >=64 RESISTANT Resistant     CEFTRIAXONE <=1 SENSITIVE Sensitive     CIPROFLOXACIN <=0.25 SENSITIVE Sensitive     GENTAMICIN <=1 SENSITIVE Sensitive     IMIPENEM <=0.25 SENSITIVE Sensitive     NITROFURANTOIN <=16 SENSITIVE Sensitive     TRIMETH/SULFA <=20 SENSITIVE Sensitive     AMPICILLIN/SULBACTAM >=32 RESISTANT Resistant     PIP/TAZO <=4 SENSITIVE Sensitive     Extended ESBL NEGATIVE  Sensitive     * >=100,000 COLONIES/mL ESCHERICHIA COLI   Enterococcus faecalis - MIC*    AMPICILLIN <=2 SENSITIVE Sensitive     LEVOFLOXACIN 1 SENSITIVE Sensitive     NITROFURANTOIN 64 INTERMEDIATE Intermediate     VANCOMYCIN 2 SENSITIVE Sensitive     * >=100,000 COLONIES/mL ENTEROCOCCUS FAECALIS   Staphylococcus aureus - MIC*    CIPROFLOXACIN >=8 RESISTANT Resistant     GENTAMICIN <=0.5 SENSITIVE Sensitive     NITROFURANTOIN <=16 SENSITIVE Sensitive     OXACILLIN <=0.25 SENSITIVE Sensitive     TETRACYCLINE <=1 SENSITIVE Sensitive     VANCOMYCIN <=0.5 SENSITIVE Sensitive     TRIMETH/SULFA <=10 SENSITIVE Sensitive     CLINDAMYCIN RESISTANT Resistant     RIFAMPIN <=0.5 SENSITIVE Sensitive     Inducible Clindamycin POSITIVE Resistant     * >=100,000 COLONIES/mL STAPHYLOCOCCUS AUREUS     Labs: Basic Metabolic Panel:  Recent Labs Lab 03/11/17 1235 03/11/17 1950 03/12/17 0244 03/13/17 0555 03/14/17 0609  NA 136 136 138 138 138  K 3.6 4.2 3.9 3.3* 3.6  CL 105 108 111 115* 111  CO2 26 21* 22 21* 22  GLUCOSE 123* 117* 141* 93 101*  BUN 21* 18 17 18 12   CREATININE 1.31* 1.17 1.19 1.05 0.97  CALCIUM 9.0 8.3* 8.0* 8.1* 8.4*   Liver Function Tests:  Recent Labs Lab 03/11/17 1950  AST 36  ALT 14*  ALKPHOS 72  BILITOT 1.7*  PROT 6.5  ALBUMIN 2.6*   No results for input(s): LIPASE, AMYLASE in the last 168 hours. No results for input(s): AMMONIA in the last 168 hours. CBC:  Recent Labs Lab 03/11/17 1235 03/11/17 1950 03/12/17 0000 03/12/17 0244 03/13/17 0555 03/14/17 0609 03/15/17 0509  WBC 7.8 5.7 13.4* 14.2* 7.2 7.2 6.7  NEUTROABS 5.0 5.0 11.0*  --   --   --   --   HGB 11.2* 11.3* 10.5* 10.2* 9.6* 10.7* 10.5*  HCT 31.7* 31.3* 30.0* 29.4* 26.6* 30.0* 29.6*  MCV 92.4 91.8 90.6 91.3 92.7 92.0 91.6  PLT 160 158 136* 127* 115* 134* 142*   Cardiac Enzymes: No results for input(s): CKTOTAL, CKMB, CKMBINDEX, TROPONINI in the last 168 hours. BNP: BNP (last 3  results)  Recent Labs  03/12/17 0000  BNP 530.7*    ProBNP (last 3 results) No results for input(s): PROBNP in the last 8760 hours.  CBG:  Recent Labs Lab 03/12/17 0735 03/13/17 0628 03/14/17 0659 03/15/17 0750  GLUCAP 96 94 93 125*       SignedDomenic Polite MD.  [facility] 03/15/2017, 1:25 PM

## 2017-03-15 NOTE — Care Management Important Message (Signed)
Important Message  Patient Details  Name: SURYA SCHROETER MRN: [id number] Date of Birth: 12/08/1921   Medicare Important Message Given:  Yes    Kerin Salen 03/15/2017, 11:36 AMImportant Message  Patient Details  Name: ARIEH BOGUE MRN: [id number] Date of Birth: Jun 21, 1922   Medicare Important Message Given:  Yes    Kerin Salen 03/15/2017, 11:36 AM

## 2017-03-15 NOTE — Progress Notes (Signed)
Went over [employer] paperwork with patient and family.  All questions answered.  Discharge papers and prescriptions given to pt daughter.  VSS.  Suprapubic cath flowing well after being flushed with 70cc of NS x2 (2 hours apart).  PT discharged via wheelchair.

## 2017-03-16 LAB — CULTURE, BLOOD (ROUTINE X 2)
CULTURE: NO GROWTH
CULTURE: NO GROWTH
SPECIAL REQUESTS: ADEQUATE
Special Requests: ADEQUATE

## 2017-03-18 DIAGNOSIS — I5022 Chronic systolic (congestive) heart failure: Secondary | ICD-10-CM | POA: Diagnosis not present

## 2017-03-20 DIAGNOSIS — C61 Malignant neoplasm of prostate: Secondary | ICD-10-CM | POA: Diagnosis not present

## 2017-03-20 DIAGNOSIS — N359 Urethral stricture, unspecified: Secondary | ICD-10-CM | POA: Diagnosis not present

## 2017-03-21 DIAGNOSIS — Z435 Encounter for attention to cystostomy: Secondary | ICD-10-CM | POA: Diagnosis not present

## 2017-03-21 DIAGNOSIS — N401 Enlarged prostate with lower urinary tract symptoms: Secondary | ICD-10-CM | POA: Diagnosis not present

## 2017-03-21 DIAGNOSIS — N138 Other obstructive and reflux uropathy: Secondary | ICD-10-CM | POA: Diagnosis not present

## 2017-03-21 DIAGNOSIS — Z87440 Personal history of urinary (tract) infections: Secondary | ICD-10-CM | POA: Diagnosis not present

## 2017-03-21 DIAGNOSIS — I25110 Atherosclerotic heart disease of native coronary artery with unstable angina pectoris: Secondary | ICD-10-CM | POA: Diagnosis not present

## 2017-03-21 DIAGNOSIS — I130 Hypertensive heart and chronic kidney disease with heart failure and stage 1 through stage 4 chronic kidney disease, or unspecified chronic kidney disease: Secondary | ICD-10-CM | POA: Diagnosis not present

## 2017-03-28 DIAGNOSIS — A419 Sepsis, unspecified organism: Secondary | ICD-10-CM | POA: Diagnosis not present

## 2017-03-28 DIAGNOSIS — I10 Essential (primary) hypertension: Secondary | ICD-10-CM | POA: Diagnosis not present

## 2017-03-28 DIAGNOSIS — Z0000 Encounter for general adult medical examination without abnormal findings: Secondary | ICD-10-CM | POA: Diagnosis not present

## 2017-03-28 DIAGNOSIS — C61 Malignant neoplasm of prostate: Secondary | ICD-10-CM | POA: Diagnosis not present

## 2017-03-28 DIAGNOSIS — E7800 Pure hypercholesterolemia, unspecified: Secondary | ICD-10-CM | POA: Diagnosis not present

## 2017-04-05 ENCOUNTER — Other Ambulatory Visit: Payer: Self-pay | Admitting: Radiology

## 2017-04-08 ENCOUNTER — Ambulatory Visit (HOSPITAL_COMMUNITY): Admission: RE | Admit: 2017-04-08 | Payer: Medicare Other | Source: Ambulatory Visit

## 2017-04-08 ENCOUNTER — Other Ambulatory Visit (HOSPITAL_COMMUNITY): Payer: Medicare Other

## 2017-04-14 ENCOUNTER — Other Ambulatory Visit: Payer: Self-pay | Admitting: Radiology

## 2017-04-15 ENCOUNTER — Encounter (HOSPITAL_COMMUNITY): Payer: Self-pay

## 2017-04-15 ENCOUNTER — Ambulatory Visit (HOSPITAL_COMMUNITY)
Admission: RE | Admit: 2017-04-15 | Discharge: 2017-04-15 | Disposition: A | Discharge status: Home / Self Care | Payer: Medicare Other | Source: Ambulatory Visit | Attending: Diagnostic Radiology | Admitting: Diagnostic Radiology

## 2017-04-15 ENCOUNTER — Ambulatory Visit (HOSPITAL_COMMUNITY)
Admission: RE | Admit: 2017-04-15 | Discharge: 2017-04-15 | Disposition: A | Discharge status: Home / Self Care | Payer: Medicare Other | Source: Ambulatory Visit | Attending: Urology | Admitting: Urology

## 2017-04-15 DIAGNOSIS — Z9359 Other cystostomy status: Secondary | ICD-10-CM | POA: Diagnosis not present

## 2017-04-15 DIAGNOSIS — C61 Malignant neoplasm of prostate: Secondary | ICD-10-CM | POA: Insufficient documentation

## 2017-04-15 DIAGNOSIS — Z01812 Encounter for preprocedural laboratory examination: Secondary | ICD-10-CM | POA: Insufficient documentation

## 2017-04-15 DIAGNOSIS — Z01818 Encounter for other preprocedural examination: Secondary | ICD-10-CM | POA: Insufficient documentation

## 2017-04-15 DIAGNOSIS — R3914 Feeling of incomplete bladder emptying: Secondary | ICD-10-CM | POA: Diagnosis not present

## 2017-04-15 DIAGNOSIS — N320 Bladder-neck obstruction: Secondary | ICD-10-CM | POA: Insufficient documentation

## 2017-04-15 DIAGNOSIS — E7800 Pure hypercholesterolemia, unspecified: Secondary | ICD-10-CM | POA: Diagnosis not present

## 2017-04-15 DIAGNOSIS — A0472 Enterocolitis due to Clostridium difficile, not specified as recurrent: Secondary | ICD-10-CM | POA: Insufficient documentation

## 2017-04-15 DIAGNOSIS — I25110 Atherosclerotic heart disease of native coronary artery with unstable angina pectoris: Secondary | ICD-10-CM | POA: Diagnosis not present

## 2017-04-15 DIAGNOSIS — Z8546 Personal history of malignant neoplasm of prostate: Secondary | ICD-10-CM | POA: Insufficient documentation

## 2017-04-15 DIAGNOSIS — Z436 Encounter for attention to other artificial openings of urinary tract: Secondary | ICD-10-CM | POA: Diagnosis not present

## 2017-04-15 DIAGNOSIS — R339 Retention of urine, unspecified: Secondary | ICD-10-CM | POA: Diagnosis not present

## 2017-04-15 DIAGNOSIS — H9190 Unspecified hearing loss, unspecified ear: Secondary | ICD-10-CM | POA: Diagnosis not present

## 2017-04-15 DIAGNOSIS — Z7982 Long term (current) use of aspirin: Secondary | ICD-10-CM | POA: Diagnosis not present

## 2017-04-15 DIAGNOSIS — I10 Essential (primary) hypertension: Secondary | ICD-10-CM | POA: Diagnosis not present

## 2017-04-15 DIAGNOSIS — N390 Urinary tract infection, site not specified: Secondary | ICD-10-CM | POA: Diagnosis not present

## 2017-04-15 DIAGNOSIS — I252 Old myocardial infarction: Secondary | ICD-10-CM | POA: Insufficient documentation

## 2017-04-15 DIAGNOSIS — Z951 Presence of aortocoronary bypass graft: Secondary | ICD-10-CM | POA: Diagnosis not present

## 2017-04-15 HISTORY — PX: IR CATHETER TUBE CHANGE: IMG717

## 2017-04-15 LAB — CBC WITH DIFFERENTIAL/PLATELET
BASOS ABS: 0 10*3/uL (ref 0.0–0.1)
BASOS PCT: 1 %
Eosinophils Absolute: 0.5 10*3/uL (ref 0.0–0.7)
Eosinophils Relative: 6 %
HCT: 33.8 % — ABNORMAL LOW (ref 39.0–52.0)
Hemoglobin: 11.8 g/dL — ABNORMAL LOW (ref 13.0–17.0)
LYMPHS PCT: 26 %
Lymphs Abs: 2 10*3/uL (ref 0.7–4.0)
MCH: 32.2 pg (ref 26.0–34.0)
MCHC: 34.9 g/dL (ref 30.0–36.0)
MCV: 92.3 fL (ref 78.0–100.0)
Monocytes Absolute: 1 10*3/uL (ref 0.1–1.0)
Monocytes Relative: 13 %
NEUTROS ABS: 4.2 10*3/uL (ref 1.7–7.7)
NEUTROS PCT: 54 %
PLATELETS: 180 10*3/uL (ref 150–400)
RBC: 3.66 MIL/uL — ABNORMAL LOW (ref 4.22–5.81)
RDW: 13.5 % (ref 11.5–15.5)
WBC: 7.6 10*3/uL (ref 4.0–10.5)

## 2017-04-15 LAB — BASIC METABOLIC PANEL
ANION GAP: 7 (ref 5–15)
BUN: 19 mg/dL (ref 6–20)
CHLORIDE: 107 mmol/L (ref 101–111)
CO2: 25 mmol/L (ref 22–32)
Calcium: 9.1 mg/dL (ref 8.9–10.3)
Creatinine, Ser: 1.22 mg/dL (ref 0.61–1.24)
GFR calc Af Amer: 57 mL/min — ABNORMAL LOW (ref >60)
GFR, EST NON AFRICAN AMERICAN: 50 mL/min — AB (ref >60)
Glucose, Bld: 113 mg/dL — ABNORMAL HIGH (ref 65–99)
POTASSIUM: 3.4 mmol/L — AB (ref 3.5–5.1)
SODIUM: 139 mmol/L (ref 135–145)

## 2017-04-15 MED ORDER — MIDAZOLAM HCL 2 MG/2ML IJ SOLN
INTRAMUSCULAR | Status: AC | PRN
  Administered 2017-04-15 (×2): 0.5 mg via INTRAVENOUS

## 2017-04-15 MED ORDER — HYDROCODONE-ACETAMINOPHEN 5-325 MG PO TABS: 1.0000 | ORAL_TABLET | ORAL | Status: DC | PRN

## 2017-04-15 MED ORDER — MIDAZOLAM HCL 2 MG/2ML IJ SOLN
INTRAMUSCULAR | Status: AC
  Filled 2017-04-15: qty 2

## 2017-04-15 MED ORDER — IOPAMIDOL (ISOVUE-300) INJECTION 61%
INTRAVENOUS | Status: AC
  Administered 2017-04-15: 10 mL
  Filled 2017-04-15: qty 50

## 2017-04-15 MED ORDER — FENTANYL CITRATE (PF) 100 MCG/2ML IJ SOLN
INTRAMUSCULAR | Status: DC
Start: 2017-04-15 — End: 2017-04-16
  Filled 2017-04-15: qty 2

## 2017-04-15 MED ORDER — SODIUM CHLORIDE 0.9 % IV SOLN
INTRAVENOUS | Status: DC
  Administered 2017-04-15: 10:00:00 via INTRAVENOUS

## 2017-04-15 MED ORDER — FENTANYL CITRATE (PF) 100 MCG/2ML IJ SOLN
INTRAMUSCULAR | Status: AC | PRN
  Administered 2017-04-15: 25 ug via INTRAVENOUS

## 2017-04-15 MED ORDER — LIDOCAINE VISCOUS 2 % MT SOLN
OROMUCOSAL | Status: AC
  Filled 2017-04-15: qty 15

## 2017-04-15 MED ORDER — DEXTROSE 5 % IV SOLN
1.0000 g | Freq: Once | INTRAVENOUS | Status: AC
  Administered 2017-04-15: 1 g via INTRAVENOUS
  Filled 2017-04-15: qty 10

## 2017-04-15 MED ORDER — IOPAMIDOL (ISOVUE-300) INJECTION 61%
50.0000 mL | Freq: Once | INTRAVENOUS | Status: AC | PRN
  Administered 2017-04-15: 10 mL

## 2017-04-15 NOTE — H&P (Signed)
Referring Physician(s): Dahlstedt,S  Supervising Physician: Markus Daft  Patient Status:  [facility]  Chief Complaint:  Prostate cancer, urinary retention , UTI's  Subjective: 83 y.o. male with past medical history of HTN, CAD, MI, and prostate cancer s/p surgical intervention now with chronic foley since 2015.  Patient has been stable with his foley cathter until recently when home health had difficulty during routine exchanges and patient developed a severe UTI at the end of July and most recently 03/11/17.Marland Kitchen  He has completed a course of Keflex for his UTI and has been in his usual state of health since this time.  IR was consulted for suprapubic catheter placement at the request of Dr. Diona Fanti and pt underwent this procedure on 03/11/17. He presents now for suprapubic catheter exchange and upsizing. He currently denies fever, headache, chest pain, dyspnea, cough, abdominal/back pain, nausea, vomiting or bleeding. Past Medical History:  Diagnosis Date  . Clostridium difficile infection    2015  . Coronary artery disease involving native coronary artery 12/1989   Unstable Angina  . Hearing difficulty   . High cholesterol   . Hypertension   . Memory difficulty   . Myocardial infarction ([place])   . Prostate cancer ([place])   . Prostate cancer ([place])   . S/P CABG x 3 12/27/1989   LIMA-D1-LAD, SVG-rPDA (PDA Endarterectomy & patch andioplasty)   . Toenail fungus   . Urinary (tract) obstruction   . UTI (urinary tract infection)    Past Surgical History:  Procedure Laterality Date  . APPENDECTOMY    . CORONARY ARTERY BYPASS GRAFT    . CYSTOSCOPY WITH URETHRAL DILATATION N/A 12/21/2014   Procedure: CYSTOSCOPY WITH RETROGRADE AND URETHRAL DILATATION ;  Surgeon: Alexis Frock, MD;  Location: [facility];  Service: Urology;  Laterality: N/A;  . LEFT HEART CATHETERIZATION WITH CORONARY/GRAFT ANGIOGRAM N/A 06/25/2014   Procedure: LEFT HEART CATHETERIZATION WITH Beatrix Fetters;  Surgeon: Leonie Man, MD;  Location: [facility] CATH LAB;  Service: Cardiovascular;  Laterality: N/A;  . MULTIPLE TOOTH EXTRACTIONS Bilateral       Allergies: Patient has no known allergies.  Medications: Prior to Admission medications   Medication Sig Start Date End Date Taking? Authorizing Provider  aspirin 81 MG tablet Take 81 mg by mouth daily.   Yes [provider]  atorvastatin (LIPITOR) 40 MG tablet Take 40 mg by mouth daily.   Yes [provider]  cephALEXin (KEFLEX) 250 MG capsule Take 1 capsule (250 mg total) by mouth 4 (four) times daily. For 3days 03/15/17  Yes Domenic Polite, MD  Cholecalciferol (VITAMIN D-3 PO) Take 1,000 Units by mouth daily.    Yes [provider]  Cyanocobalamin (VITAMIN B-12 PO) Take 1,000 mcg by mouth daily.    Yes [provider]  donepezil (ARICEPT) 10 MG tablet Take 1 tablet (10 mg total) by mouth at bedtime. 10/25/16  Yes Sater, Nanine Means, MD  finasteride (PROSCAR) 5 MG tablet Take 5 mg by mouth daily.   Yes [provider]  losartan (COZAAR) 100 MG tablet Take 100 mg by mouth daily.   Yes [provider]  Melatonin 10 MG TABS Take 10 mg by mouth at bedtime.    Yes [provider]  Omega-3 Fatty Acids (FISH OIL) 1200 MG CAPS Take 1,200 mg by mouth daily.   Yes [provider]  Probiotic Product (PROBIOTIC & ACIDOPHILUS EX ST PO) Take 1 tablet by mouth 2 (two) times daily.    Yes [provider]  ciprofloxacin (CIPRO) 250 MG tablet Take 1 tablet (250 mg total) by mouth 2 (two) times daily. For 3days 03/15/17   Domenic Polite, MD     Vital Signs: BP (!) 101/50   Pulse (!) 101   Temp (!) 97.4 F (36.3 C) (Oral)   Resp 16   Ht 5\' 9"  (1.753 m)   Wt 151 lb 6.4 oz (68.7 kg)   SpO2 99%   BMI 22.36 kg/m   Physical Exam awake but not completely alert; doesn't know year; chest- CTA bilat; heart- bradycardic with occasional ectopy noted; abd soft,+BS,NT; intact suprapubic cath, site ok; no LE  edema  Imaging: No results found.  Labs:  CBC:  Recent Labs  03/12/17 0244 03/13/17 0555 03/14/17 0609 03/15/17 0509  WBC 14.2* 7.2 7.2 6.7  HGB 10.2* 9.6* 10.7* 10.5*  HCT 29.4* 26.6* 30.0* 29.6*  PLT 127* 115* 134* 142*    COAGS:  Recent Labs  03/11/17 1235 03/11/17 1950  INR 1.08 1.11    BMP:  Recent Labs  03/11/17 1950 03/12/17 0244 03/13/17 0555 03/14/17 0609  NA 136 138 138 138  K 4.2 3.9 3.3* 3.6  CL 108 111 115* 111  CO2 21* 22 21* 22  GLUCOSE 117* 141* 93 101*  BUN 18 17 18 12   CALCIUM 8.3* 8.0* 8.1* 8.4*  CREATININE 1.17 1.19 1.05 0.97  GFRNONAA 52* 51* 59* >60  GFRAA >60 59* >60 >60    LIVER FUNCTION TESTS:  Recent Labs  03/11/17 1950  BILITOT 1.7*  AST 36  ALT 14*  ALKPHOS 72  PROT 6.5  ALBUMIN 2.6*    Assessment and Plan: 83 y.o. male with past medical history of HTN, CAD, MI, and prostate cancer s/p surgical intervention now with chronic foley since 2015.  Patient has been stable with his foley cathter until recently when home health had difficulty during routine exchanges and patient developed a severe UTI at the end of July and most recently 03/11/17.Marland Kitchen  He has completed a course of Keflex for his UTI and has been in his usual state of health since this time.  IR was consulted for suprapubic catheter placement at the request of Dr. Diona Fanti and pt underwent this procedure on 03/11/17. He presents now for suprapubic catheter exchange and upsizing. Details/risks of procedure, including but not limited to, internal bleeding, infection, injury to radiation structures, discussed with patient and sister with their understanding and consent. Labs pend.   Electronically Signed: D. Rowe Robert, PA-C 04/15/2017, 10:33 AM   I spent a total of 20 minutes at the the patient's bedside AND on the patient's hospital floor or unit, greater than 50% of which was counseling/coordinating care for suprapubic catheter exchange/upsizing

## 2017-04-15 NOTE — Procedures (Signed)
Successful upsize of the suprapubic tube.  Patient now has a 16 Fr balloon-retention tube.  Minimal blood loss and no immediate complication.  Will observe for 2 hours after case due to infection issues after initial placement.

## 2017-04-15 NOTE — Progress Notes (Signed)
All patient's discharge instructions reviewed with patient and his sister Opal Sidles. Written copy given of all instructions given and Opal Sidles will give that to patient's caregivers at home who are waiting to receive patient upon return home. Supplies sent home - gauze/tape as well as another drainage bag identical to the one he has. Sister gave patient both hearing aides back and eyeglasses and pt d/c to car via wheelchair with all belongings. Rowe Robert PA came to see patient prior to d/c per MD order. All VS stable. Family knows when to call MD/ go to ER. List of reasons to call reviewed and given in d/c instructions for family. Numbers provided for Dr. Diona Fanti and IR MD. Home with sister at 73.

## 2017-04-15 NOTE — Discharge Instructions (Signed)
Suprapubic Catheter Replacement, Care After Refer to this sheet in the next few weeks. These instructions provide you with information about caring for yourself after your procedure. Your health care provider may also give you more specific instructions. Your treatment has been planned according to current medical practices, but problems sometimes occur. Call your health care provider if you have any problems or questions after your procedure. What can I expect after the procedure? After your procedure, it is possible to have some discomfort around the opening in your abdomen. Follow these instructions at home: Caring for your skin around the catheter Use a clean washcloth and soapy water to clean the skin around your catheter every day. Pat the area dry with a clean towel.  Do not pull on the catheter.  Do not use ointment or lotion on this area unless told by your health care provider.  Check your skin around the catheter every day for signs of infection. Check for: ? Redness, swelling, or pain. ? Fluid or blood. ? Warmth. ? Pus or a bad smell.  Caring for the catheter tube  Clean the catheter tube with soap and water as often as told by your health care provider.  Always make sure there are no twists or curls (kinks) in the catheter tube. Emptying the collection bag Empty the large collection bag every 8 hours. Empty the small collection bag when it is about ? full. To empty your large or small collection bag, take the following steps:  Always keep the bag below the level of the catheter. This keeps urine from flowing backwards into the catheter.  Hold the bag over the toilet or another container. Turn the valve (spigot) at the bottom of the bag to empty the urine. ? Do not touch the opening of the spigot. ? Do not let the opening touch the toilet or container.  Close the spigot tightly when the bag is empty.  Cleaning the collection bag  Clean the collection bag every 2-3  days, or as often as told by your health care provider. To do this, take the following steps:  Wash your hands with soap and water. If soap and water are not available, use hand sanitizer.  Disconnect the bag from the catheter and immediately attach a new bag to the catheter.  Empty the used bag completely.  Clean the used bag using one of the following methods: ? Rinse the bag with warm water and soap. ? Fill the bag with water and add 1 tsp of vinegar. Let it sit for about 30 minutes, then empty the bag.  Let the bag dry completely, and put it in a clean plastic bag before storing it.  General instructions  Always wash your hands before and after caring for your catheter and collection bag. Use a mild, fragrance-free soap. If soap and water are not available, use hand sanitizer.  Always make sure there are no leaks in the catheter or collection bag.  Drink enough fluid to keep your urine clear or pale yellow.  If you were prescribed an antibiotic medicine, take it as told by your health care provider. Do not stop taking the antibiotic even if you start to feel better.  Do not take baths, swim, or use a hot tub.  Keep all follow-up appointments as told by your health care provider. This is important. Contact a health care provider if:  You leak urine.  You have redness, swelling, or pain around your catheter opening.  You have  fluid or blood coming from your catheter opening. °· Your catheter opening feels warm to the touch. °· You have pus or a bad smell coming from your catheter opening. °· You have a fever or chills. °· Your urine flow slows down. °· Your urine becomes cloudy or smelly. °Get help right away if: °· Your catheter comes out. °· You feel nauseous. °· You have back pain. °· You have difficulty changing your catheter. °· You have blood in your urine. °· You have no urine flow for 1 hour. °This information is not intended to replace advice given to you by your health  care provider. Make sure you discuss any questions you have with your health care provider. °Document Released: 03/27/2011 Document Revised: 03/07/2016 Document Reviewed: 03/22/2015 °Elsevier Interactive Patient Education © 2018 Elsevier Inc[employer] ° ° °Moderate Conscious Sedation, Adult, Care After °These instructions provide you with information about caring for yourself after your procedure. Your health care provider may also give you more specific instructions. Your treatment has been planned according to current medical practices, but problems sometimes occur. Call your health care provider if you have any problems or questions after your procedure. °What can I expect after the procedure? °After your procedure, it is common: °· To feel sleepy for several hours. °· To feel clumsy and have poor balance for several hours. °· To have poor judgment for several hours. °· To vomit if you eat too soon. ° °Follow these instructions at home: °For at least 24 hours after the procedure: ° °· Do not: °? Participate in activities where you could fall or become injured. °? Drive. °? Use heavy machinery. °? Drink alcohol. °? Take sleeping pills or medicines that cause drowsiness. °? Make important decisions or sign legal documents. °? Take care of children on your own. °· Rest. °Eating and drinking °· Follow the diet recommended by your health care provider. °· If you vomit: °? Drink water, juice, or soup when you can drink without vomiting. °? Make sure you have little or no nausea before eating solid foods. °General instructions °· Have a responsible adult stay with you until you are awake and alert. °· Take over-the-counter and prescription medicines only as told by your health care provider. °· If you smoke, do not smoke without supervision. °· Keep all follow-up visits as told by your health care provider. This is important. °Contact a health care provider if: °· You keep feeling nauseous or you keep vomiting. °· You feel  light-headed. °· You develop a rash. °· You have a fever. °Get help right away if: °· You have trouble breathing. °This information is not intended to replace advice given to you by your health care provider. Make sure you discuss any questions you have with your health care provider. °Document Released: 04/29/2013 Document Revised: 12/12/2015 Document Reviewed: 10/29/2015 °Elsevier Interactive Patient Education © 2018 Elsevier Inc. ° ° °

## 2017-04-22 ENCOUNTER — Telehealth: Payer: Self-pay | Admitting: Cardiology

## 2017-04-22 NOTE — Telephone Encounter (Signed)
°  New Prob  States pt was seen at the [facility] for follow up. States blood pressure medication dosage was changed. Requesting to speak to nurse regarding changes.

## 2017-04-22 NOTE — Telephone Encounter (Signed)
Pt of Dr. Stanford Breed  Returned call to Ms. Truman Hayward, listed EC/DPR  She states since "silent" heart attack, pt had been on metoprolol tartrate 12.5mg  BID  & losartan 100mg   1 month ago had procedure for suprapubic cath insertion, had sepsis afterwards Metoprolol dc'ed in hospital   Pt followed up at [facility] after hospitalization MD at [facility] gave instruction to cut losartan from 100mg  to 25mg   BPs 98/60 at [facility] office  Daughter cut the losartan to 50mg  instead of 25mg , was afraid to cut dose further & wanted Dr. Jacalyn Lefevre advice.  She's been recommended to follow BPs for 2 weeks per [facility] MD recommendation, but this is day 3 of observation of BPs. She's unsure what normal BP is for him. He's not having any symptoms she's aware of - denies lightheadedness/dizziness on standing, etc.  Sat BP 90s/60s      HR 59   Sun BP 98/54         HR 62 AM    110/63                 PM  Recommended she follow MD instruction, cut losartan back. Unsure if metoprolol would be restarted since baseline HR 50s-60s. As she wants Dr. Jacalyn Lefevre advice, routed for recommendations.

## 2017-04-22 NOTE — Telephone Encounter (Signed)
Since BP running low, agree with changing cozaar to 25 mg daily and DCing lopressor [employer]

## 2017-04-22 NOTE — Telephone Encounter (Signed)
Followed up and spoke to daughter, she verbalized understanding of MD advice and thanks for call. [facility] for further instruction after 2 week BP monitoring.

## 2017-04-25 DIAGNOSIS — I130 Hypertensive heart and chronic kidney disease with heart failure and stage 1 through stage 4 chronic kidney disease, or unspecified chronic kidney disease: Secondary | ICD-10-CM | POA: Diagnosis not present

## 2017-04-25 DIAGNOSIS — N138 Other obstructive and reflux uropathy: Secondary | ICD-10-CM | POA: Diagnosis not present

## 2017-04-25 DIAGNOSIS — Z87440 Personal history of urinary (tract) infections: Secondary | ICD-10-CM | POA: Diagnosis not present

## 2017-04-25 DIAGNOSIS — N401 Enlarged prostate with lower urinary tract symptoms: Secondary | ICD-10-CM | POA: Diagnosis not present

## 2017-04-27 ENCOUNTER — Emergency Department (HOSPITAL_COMMUNITY)
Admission: EM | Admit: 2017-04-27 | Discharge: 2017-04-28 | Disposition: A | Discharge status: Home / Self Care | Payer: Medicare Other | Attending: Emergency Medicine | Admitting: Emergency Medicine

## 2017-04-27 ENCOUNTER — Encounter (HOSPITAL_COMMUNITY): Payer: Self-pay

## 2017-04-27 DIAGNOSIS — Z87891 Personal history of nicotine dependence: Secondary | ICD-10-CM | POA: Diagnosis not present

## 2017-04-27 DIAGNOSIS — Z79899 Other long term (current) drug therapy: Secondary | ICD-10-CM | POA: Diagnosis not present

## 2017-04-27 DIAGNOSIS — I509 Heart failure, unspecified: Secondary | ICD-10-CM | POA: Insufficient documentation

## 2017-04-27 DIAGNOSIS — R531 Weakness: Secondary | ICD-10-CM | POA: Insufficient documentation

## 2017-04-27 DIAGNOSIS — I110 Hypertensive heart disease with heart failure: Secondary | ICD-10-CM | POA: Insufficient documentation

## 2017-04-27 DIAGNOSIS — I2510 Atherosclerotic heart disease of native coronary artery without angina pectoris: Secondary | ICD-10-CM | POA: Insufficient documentation

## 2017-04-27 DIAGNOSIS — N390 Urinary tract infection, site not specified: Secondary | ICD-10-CM | POA: Diagnosis not present

## 2017-04-27 DIAGNOSIS — R509 Fever, unspecified: Secondary | ICD-10-CM | POA: Diagnosis not present

## 2017-04-27 MED ORDER — ACETAMINOPHEN 500 MG PO TABS
1000.0000 mg | ORAL_TABLET | Freq: Once | ORAL | Status: AC
  Administered 2017-04-28: 1000 mg via ORAL
  Filled 2017-04-27: qty 2

## 2017-04-27 NOTE — ED Triage Notes (Signed)
Pt arrived from home by [facility], tempt was 100.7 prior to arrival. Pt's Catheter was changed a week ago and family stated the "patient was weaker today and didn't want to get out of bed" Family states "Pt prone to UTI's and just wanted to get ahead"  CBG was 138.

## 2017-04-28 ENCOUNTER — Emergency Department (HOSPITAL_COMMUNITY): Payer: Medicare Other

## 2017-04-28 DIAGNOSIS — R531 Weakness: Secondary | ICD-10-CM | POA: Diagnosis not present

## 2017-04-28 LAB — CBC WITH DIFFERENTIAL/PLATELET
BASOS PCT: 0 %
Basophils Absolute: 0 10*3/uL (ref 0.0–0.1)
EOS ABS: 0 10*3/uL (ref 0.0–0.7)
EOS PCT: 0 %
HCT: 34.2 % — ABNORMAL LOW (ref 39.0–52.0)
Hemoglobin: 11.9 g/dL — ABNORMAL LOW (ref 13.0–17.0)
LYMPHS ABS: 0.9 10*3/uL (ref 0.7–4.0)
Lymphocytes Relative: 9 %
MCH: 32.2 pg (ref 26.0–34.0)
MCHC: 34.8 g/dL (ref 30.0–36.0)
MCV: 92.7 fL (ref 78.0–100.0)
Monocytes Absolute: 1.5 10*3/uL — ABNORMAL HIGH (ref 0.1–1.0)
Monocytes Relative: 14 %
Neutro Abs: 7.7 10*3/uL (ref 1.7–7.7)
Neutrophils Relative %: 77 %
PLATELETS: 183 10*3/uL (ref 150–400)
RBC: 3.69 MIL/uL — AB (ref 4.22–5.81)
RDW: 13.7 % (ref 11.5–15.5)
WBC: 10.1 10*3/uL (ref 4.0–10.5)

## 2017-04-28 LAB — BASIC METABOLIC PANEL
Anion gap: 8 (ref 5–15)
BUN: 15 mg/dL (ref 6–20)
CO2: 25 mmol/L (ref 22–32)
CREATININE: 1.2 mg/dL (ref 0.61–1.24)
Calcium: 9.2 mg/dL (ref 8.9–10.3)
Chloride: 109 mmol/L (ref 101–111)
GFR, EST AFRICAN AMERICAN: 59 mL/min — AB (ref >60)
GFR, EST NON AFRICAN AMERICAN: 51 mL/min — AB (ref >60)
Glucose, Bld: 171 mg/dL — ABNORMAL HIGH (ref 65–99)
POTASSIUM: 3.9 mmol/L (ref 3.5–5.1)
SODIUM: 142 mmol/L (ref 135–145)

## 2017-04-28 LAB — URINALYSIS, ROUTINE W REFLEX MICROSCOPIC
Bacteria, UA: NONE SEEN
Bilirubin Urine: NEGATIVE
Glucose, UA: NEGATIVE mg/dL
HGB URINE DIPSTICK: NEGATIVE
Ketones, ur: NEGATIVE mg/dL
Leukocytes, UA: NEGATIVE
Nitrite: POSITIVE — AB
Protein, ur: NEGATIVE mg/dL
RBC / HPF: NONE SEEN RBC/hpf (ref 0–5)
Specific Gravity, Urine: 1.005 (ref 1.005–1.030)
Squamous Epithelial / LPF: NONE SEEN
WBC, UA: NONE SEEN WBC/hpf (ref 0–5)
pH: 7 (ref 5.0–8.0)

## 2017-04-28 MED ORDER — CIPROFLOXACIN HCL 500 MG PO TABS: 500.0000 mg | ORAL_TABLET | Freq: Two times a day (BID) | ORAL | 0 refills | Status: DC

## 2017-04-28 MED ORDER — VANCOMYCIN HCL IN DEXTROSE 1-5 GM/200ML-% IV SOLN
1000.0000 mg | Freq: Once | INTRAVENOUS | Status: AC
  Administered 2017-04-28: 1000 mg via INTRAVENOUS
  Filled 2017-04-28: qty 200

## 2017-04-28 MED ORDER — DEXTROSE 5 % IV SOLN
2.0000 g | Freq: Once | INTRAVENOUS | Status: AC
  Administered 2017-04-28: 2 g via INTRAVENOUS
  Filled 2017-04-28: qty 2

## 2017-04-28 MED ORDER — CEPHALEXIN 500 MG PO CAPS: 500.0000 mg | ORAL_CAPSULE | Freq: Two times a day (BID) | ORAL | 0 refills | Status: DC

## 2017-04-28 NOTE — Progress Notes (Signed)
A consult was received from an ED physician for cefepime and vancomycin per pharmacy dosing.  The patient's profile has been reviewed for ht/wt/allergies/indication/available labs.   A one time order has been placed for cefepime 2 Gm and Vancomycin 1 Gm.  Further antibiotics/pharmacy consults should be ordered by admitting physician if indicated.                       Thank you, Dorrene German 04/28/2017  1:46 AM

## 2017-04-28 NOTE — ED Notes (Signed)
Family contacts Henry Curtis caretaker- [phone] Daughter Henry Curtis [phone]

## 2017-04-28 NOTE — ED Provider Notes (Signed)
[place] DEPT Provider Note   CSN: [id number] Arrival date & time: 04/27/17  2321     History   Chief Complaint Chief Complaint  Patient presents with  . Urinary Tract Infection    HPI Henry Curtis is a 83 y.o. male.  Patient presents to the emergency department with chief complaint of confusion and generalized weakness.  He is accompanied by his sister, who states that he recently had a urinary tract infection, and they're concerned it is returning. She reports that the patient has felt generalized fatigue/weakness today, and also had some confusion. She reports that he had an elevated temperature at home of 100.7. Patient denies any chest pain, shortness of breath, cough, abdominal pain, nausea, or vomiting. He has a chronic Foley catheter.  He was recently admitted the hospital for UTI and had a culture which grew enterococcus faecalis, MSSA, and e-coli.  He was septic at that time, but improved quickly with IV antibiotics.   The history is provided by the patient. No language interpreter was used.    Past Medical History:  Diagnosis Date  . Clostridium difficile infection    2015  . Coronary artery disease involving native coronary artery 12/1989   Unstable Angina  . Hearing difficulty   . High cholesterol   . Hypertension   . Memory difficulty   . Myocardial infarction ([place])   . Prostate cancer ([place])   . Prostate cancer ([place])   . S/P CABG x 3 12/27/1989   LIMA-D1-LAD, SVG-rPDA (PDA Endarterectomy & patch andioplasty)   . Toenail fungus   . Urinary (tract) obstruction   . UTI (urinary tract infection)     Patient Active Problem List   Diagnosis Date Noted  . Chronic systolic CHF (congestive heart failure) ([place]) 03/12/2017  . Complicated UTI (urinary tract infection) 03/11/2017  . Sepsis ([place]) 03/11/2017  . CKD (chronic kidney disease), stage III ([place]) 03/11/2017  . Acute metabolic encephalopathy 95/28/4132  . Alzheimer disease 10/25/2016  . Memory loss  01/20/2015  . Urinary retention 01/20/2015  . Gait disturbance 01/20/2015  . Vitamin D deficiency 01/20/2015  . Hematuria 12/21/2014  . Pressure ulcer 12/21/2014  . Hyperlipidemia 07/07/2014  . Enteritis due to Clostridium difficile 06/29/2014  . Clostridial gastroenteritis 06/28/2014  . Acute urinary retention   . Other specified hypotension   . Non Q wave myocardial infarction ([place]) 06/23/2014  . Acute renal failure ([place]) 06/23/2014  . Acute encephalopathy 06/23/2014  . Essential hypertension 06/23/2014  . Non-ST elevation (NSTEMI) myocardial infarction ([place])   . CAD - s/p CABP 12/1989; LIMA-D1-LAD, SVG-rPDA (PDA Endarterectomy & patch andioplasty) 12/27/1989    Past Surgical History:  Procedure Laterality Date  . APPENDECTOMY    . CORONARY ARTERY BYPASS GRAFT    . CYSTOSCOPY WITH URETHRAL DILATATION N/A 12/21/2014   Procedure: CYSTOSCOPY WITH RETROGRADE AND URETHRAL DILATATION ;  Surgeon: Alexis Frock, MD;  Location: [facility] ORS;  Service: Urology;  Laterality: N/A;  . IR CATHETER TUBE CHANGE  04/15/2017  . LEFT HEART CATHETERIZATION WITH CORONARY/GRAFT ANGIOGRAM N/A 06/25/2014   Procedure: LEFT HEART CATHETERIZATION WITH Beatrix Fetters;  Surgeon: Leonie Man, MD;  Location: [facility] CATH LAB;  Service: Cardiovascular;  Laterality: N/A;  . MULTIPLE TOOTH EXTRACTIONS Bilateral        Home Medications    Prior to Admission medications   Medication Sig Start Date End Date Taking? Authorizing Provider  aspirin 81 MG tablet Take 81 mg by mouth daily.    [provider]  atorvastatin (LIPITOR) 40 MG tablet Take 40 mg by mouth daily.    [provider]  cephALEXin (KEFLEX) 250 MG capsule Take 1 capsule (250 mg total) by mouth 4 (four) times daily. For 3days 03/15/17   Domenic Polite, MD  Cholecalciferol (VITAMIN D-3 PO) Take 1,000 Units by mouth daily.     [provider]  ciprofloxacin (CIPRO) 250 MG tablet Take 1 tablet (250 mg total) by mouth 2  (two) times daily. For 3days 03/15/17   Domenic Polite, MD  Cyanocobalamin (VITAMIN B-12 PO) Take 1,000 mcg by mouth daily.     [provider]  donepezil (ARICEPT) 10 MG tablet Take 1 tablet (10 mg total) by mouth at bedtime. 10/25/16   Sater, Nanine Means, MD  finasteride (PROSCAR) 5 MG tablet Take 5 mg by mouth daily.    [provider]  losartan (COZAAR) 100 MG tablet Take 100 mg by mouth daily.    [provider]  Melatonin 10 MG TABS Take 10 mg by mouth at bedtime.     [provider]  Omega-3 Fatty Acids (FISH OIL) 1200 MG CAPS Take 1,200 mg by mouth daily.    [provider]  Probiotic Product (PROBIOTIC & ACIDOPHILUS EX ST PO) Take 1 tablet by mouth 2 (two) times daily.     [provider]    Family History Family History  Problem Relation Age of Onset  . Diabetes Father   . Deep vein thrombosis Father   . Neuropathy Sister   . Heart attack Brother     Social History Social History  Substance Use Topics  . Smoking status: Former Research scientist (life sciences)  . Smokeless tobacco: Never Used  . Alcohol use No     Allergies   Patient has no known allergies.   Review of Systems Review of Systems  All other systems reviewed and are negative.    Physical Exam Updated Vital Signs BP (!) 117/53 (BP Location: Left Arm)   Pulse 91   Temp 99.7 F (37.6 C) (Oral)   Resp 18   SpO2 98%   Physical Exam  Constitutional: He is oriented to person, place, and time. He appears well-developed and well-nourished.  HENT:  Head: Normocephalic and atraumatic.  Eyes: Pupils are equal, round, and reactive to light. Conjunctivae and EOM are normal. Right eye exhibits no discharge. Left eye exhibits no discharge. No scleral icterus.  Neck: Normal range of motion. Neck supple. No JVD present.  Cardiovascular: Normal rate, regular rhythm and normal heart sounds.  Exam reveals no gallop and no friction rub.   No murmur heard. Pulmonary/Chest: Effort normal  and breath sounds normal. No respiratory distress. He has no wheezes. He has no rales. He exhibits no tenderness.  Abdominal: Soft. He exhibits no distension and no mass. There is no tenderness. There is no rebound and no guarding.  Musculoskeletal: Normal range of motion. He exhibits no edema or tenderness.  Neurological: He is alert and oriented to person, place, and time.  Skin: Skin is warm and dry.  Psychiatric: He has a normal mood and affect. His behavior is normal. Judgment and thought content normal.  Nursing note and vitals reviewed.    ED Treatments / Results  Labs (all labs ordered are listed, but only abnormal results are displayed) Labs Reviewed  CBC WITH DIFFERENTIAL/PLATELET  BASIC METABOLIC PANEL  URINALYSIS, ROUTINE W REFLEX MICROSCOPIC    EKG  EKG Interpretation None       Radiology No results found.  Procedures Procedures (including critical care time)  Medications Ordered in ED Medications  acetaminophen (TYLENOL) tablet 1,000 mg (1,000 mg Oral Given 04/28/17 0000)     Initial Impression / Assessment and Plan / ED Course  I have reviewed the triage vital signs and the nursing notes.  Pertinent labs & imaging results that were available during my care of the patient were reviewed by me and considered in my medical decision making (see chart for details).     Patient with generalized fatigue.  Admitted recently for UTI.  Family members are concerned that he has another UTI.  He was mildly febrile earlier at 100.7, but does not have any focal symptoms.  He is alert and oriented and mentating appropriately.  He responds to all questions appropriately.  His laboratory workup, UA, and CXR are largely unremarkable.  It's possible that he is developing another UTI, will send urine for culture and will treat patient with the same antibiotic regimen that he was recently discharged on.  Discussed the patient with Dr. Stark Jock, who agrees with plan for discharge.   Patient is non-toxica appearing.  Family is comfortable with taking patient home. He has an in-home caregiver.  Specific return precautions given.  Final Clinical Impressions(s) / ED Diagnoses   Final diagnoses:  Weakness    New Prescriptions New Prescriptions   CEPHALEXIN (KEFLEX) 500 MG CAPSULE    Take 1 capsule (500 mg total) by mouth 2 (two) times daily.   CIPROFLOXACIN (CIPRO) 500 MG TABLET    Take 1 tablet (500 mg total) by mouth 2 (two) times daily.     Montine Circle, PA-C 04/28/17 1610    Veryl Speak, MD 04/28/17 [phone]

## 2017-04-29 ENCOUNTER — Encounter (HOSPITAL_COMMUNITY): Payer: Self-pay | Admitting: Interventional Radiology

## 2017-04-29 ENCOUNTER — Ambulatory Visit (HOSPITAL_COMMUNITY)
Admission: RE | Admit: 2017-04-29 | Discharge: 2017-04-29 | Disposition: A | Discharge status: Home / Self Care | Payer: Medicare Other | Source: Ambulatory Visit | Attending: Diagnostic Radiology | Admitting: Diagnostic Radiology

## 2017-04-29 ENCOUNTER — Other Ambulatory Visit (HOSPITAL_COMMUNITY): Payer: Self-pay | Admitting: Diagnostic Radiology

## 2017-04-29 DIAGNOSIS — Y738 Miscellaneous gastroenterology and urology devices associated with adverse incidents, not elsewhere classified: Secondary | ICD-10-CM | POA: Diagnosis not present

## 2017-04-29 DIAGNOSIS — R102 Pelvic and perineal pain: Secondary | ICD-10-CM

## 2017-04-29 DIAGNOSIS — T83090A Other mechanical complication of cystostomy catheter, initial encounter: Secondary | ICD-10-CM | POA: Insufficient documentation

## 2017-04-29 HISTORY — PX: IR CATHETER TUBE CHANGE: IMG717

## 2017-04-29 LAB — URINE CULTURE: CULTURE: NO GROWTH

## 2017-04-29 MED ORDER — LIDOCAINE HCL 1 % IJ SOLN
INTRAMUSCULAR | Status: AC
  Filled 2017-04-29: qty 20

## 2017-04-29 MED ORDER — IOPAMIDOL (ISOVUE-300) INJECTION 61%
INTRAVENOUS | Status: AC
  Administered 2017-04-29: 30 mL
  Filled 2017-04-29: qty 50

## 2017-04-29 MED ORDER — LIDOCAINE HCL (PF) 1 % IJ SOLN
INTRAMUSCULAR | Status: DC | PRN
  Administered 2017-04-29: 12 mL

## 2017-04-29 MED ORDER — LIDOCAINE HCL (PF) 1 % IJ SOLN
INTRAMUSCULAR | Status: DC | PRN
  Administered 2017-04-29: 8 mL
  Administered 2017-04-29: 12 mL

## 2017-04-29 NOTE — Procedures (Signed)
Interventional Radiology Procedure Note  Procedure: Attempt at rescue of displaced suprapubic catheter.  There was no connecting tract to the urinary bladder.  The existing ostomy was used to place a new catheter with sharp entry into the bladder under [place].    New 12 F drain placed to gravity.  .  Complications: None Recommendations:  - To gravity drain - Will need to initiate upsizing to larger drain.  First upsize in 4-6 weeks.  - Do not submerge for 7 days - Routine care   Signed,  Dulcy Fanny. Earleen Newport, DO

## 2017-05-02 ENCOUNTER — Telehealth: Payer: Self-pay | Admitting: Cardiology

## 2017-05-02 NOTE — Telephone Encounter (Signed)
Spoke with daughter regarding blood pressure, confirmed readings listed.  On 10/2 blood pressure 77/61 few hours later 99/54 and 10/5 blood pressure 88/47. Did confirm with daughter Losartan 25 mg daily current dose. Today with no Losartan blood pressure 105/54 at 10:45 am.  Discussed with Raquel Pharm D and would hold Losartan altogether for now and continue to monitor. Advised daughter and she will call back with update  Will forward to Dr Stanford Breed for review

## 2017-05-02 NOTE — Telephone Encounter (Signed)
Dc Henry Curtis Kirk Ruths

## 2017-05-02 NOTE — Telephone Encounter (Signed)
°  New Prob  Pt c/o BP issue: STAT if pt c/o blurred vision, one-sided weakness or slurred speech  1. What are your last 5 BP readings?  105/54 HR 55 (this morning)-did not take BP pill this morning 101/46 HR 59 (yesterday morning) 94/57 HR 62 (yesterday afternoon) 96/49 HR 65 (last night)  2. Are you having any other symptoms (ex. Dizziness, headache, blurred vision, passed out)? Daughter states he has been sleeping a lot.  3. What is your BP issue? Daughter is concerned pts BP is too low.

## 2017-05-06 ENCOUNTER — Ambulatory Visit: Payer: Medicare Other | Admitting: Podiatry

## 2017-05-10 ENCOUNTER — Encounter: Payer: Self-pay | Admitting: Podiatry

## 2017-05-10 ENCOUNTER — Ambulatory Visit (INDEPENDENT_AMBULATORY_CARE_PROVIDER_SITE_OTHER): Payer: Medicare Other | Admitting: Podiatry

## 2017-05-10 DIAGNOSIS — M79609 Pain in unspecified limb: Secondary | ICD-10-CM | POA: Diagnosis not present

## 2017-05-10 DIAGNOSIS — B351 Tinea unguium: Secondary | ICD-10-CM

## 2017-05-12 NOTE — Progress Notes (Signed)
Patient ID: AC COLAN, male   DOB: [birth year], 83 y.o.   MRN: [id number]  Subjective: 83 y.o. returns the office today for painful, elongated, thickened toenails which he is unable to trim himself. Denies any redness or drainage around the nails. Denies any acute changes since last appointment and no new complaints today. Denies any systemic complaints such as fevers, chills, nausea, vomiting.   Objective: AAO 3, NAD DP/PT pulses palpable 1/4, CRT less than 3 seconds Nails hypertrophic, dystrophic, elongated, brittle, discolored 10. There is tenderness overlying the nails 1-5 bilaterally. There is no surrounding erythema or drainage along the nail sites.  No open sorse today.  No open lesions or pre-ulcerative lesions are identified. No other areas of tenderness bilateral lower extremities. No overlying edema, erythema, increased warmth. No pain with calf compression, swelling, warmth, erythema.  Assessment: Patient presents with symptomatic onychomycosis  Plan: -Treatment options including alternatives, risks, complications were discussed -Nails sharply debrided 10 without complication/bleeding. -Discussed daily foot inspection. If there are any changes, to call the office immediately.  -Follow-up in 3 months or sooner if any problems are to arise. In the meantime, encouraged to call the office with any questions, concerns, changes symptoms. Will seem him at 9 weeks as the hallux nails are causing pressure to the distal portions of the toes.   Celesta Gentile, DPM

## 2017-05-17 ENCOUNTER — Other Ambulatory Visit (HOSPITAL_COMMUNITY): Payer: Self-pay | Admitting: Interventional Radiology

## 2017-05-17 DIAGNOSIS — R339 Retention of urine, unspecified: Secondary | ICD-10-CM

## 2017-05-17 DIAGNOSIS — Z8546 Personal history of malignant neoplasm of prostate: Secondary | ICD-10-CM

## 2017-05-22 DIAGNOSIS — Z436 Encounter for attention to other artificial openings of urinary tract: Secondary | ICD-10-CM | POA: Diagnosis not present

## 2017-05-22 DIAGNOSIS — C61 Malignant neoplasm of prostate: Secondary | ICD-10-CM | POA: Diagnosis not present

## 2017-05-22 DIAGNOSIS — R9721 Rising PSA following treatment for malignant neoplasm of prostate: Secondary | ICD-10-CM | POA: Diagnosis not present

## 2017-05-27 ENCOUNTER — Other Ambulatory Visit (HOSPITAL_COMMUNITY): Payer: Self-pay | Admitting: Interventional Radiology

## 2017-05-27 ENCOUNTER — Ambulatory Visit (HOSPITAL_COMMUNITY)
Admission: RE | Admit: 2017-05-27 | Discharge: 2017-05-27 | Disposition: A | Discharge status: Home / Self Care | Payer: Medicare Other | Source: Ambulatory Visit | Attending: Interventional Radiology | Admitting: Interventional Radiology

## 2017-05-27 ENCOUNTER — Encounter (HOSPITAL_COMMUNITY): Payer: Self-pay | Admitting: Interventional Radiology

## 2017-05-27 DIAGNOSIS — Z435 Encounter for attention to cystostomy: Secondary | ICD-10-CM | POA: Diagnosis not present

## 2017-05-27 DIAGNOSIS — R339 Retention of urine, unspecified: Secondary | ICD-10-CM

## 2017-05-27 DIAGNOSIS — Z8546 Personal history of malignant neoplasm of prostate: Secondary | ICD-10-CM

## 2017-05-27 DIAGNOSIS — Z436 Encounter for attention to other artificial openings of urinary tract: Secondary | ICD-10-CM | POA: Insufficient documentation

## 2017-05-27 DIAGNOSIS — C61 Malignant neoplasm of prostate: Secondary | ICD-10-CM | POA: Insufficient documentation

## 2017-05-27 HISTORY — PX: IR CATHETER TUBE CHANGE: IMG717

## 2017-05-27 MED ORDER — IOPAMIDOL (ISOVUE-300) INJECTION 61%
INTRAVENOUS | Status: AC
  Administered 2017-05-27: 10 mL
  Filled 2017-05-27: qty 50

## 2017-05-27 MED ORDER — LIDOCAINE HCL 1 % IJ SOLN
INTRAMUSCULAR | Status: DC | PRN
  Administered 2017-05-27: 10 mL via INTRADERMAL

## 2017-05-27 MED ORDER — LIDOCAINE HCL 1 % IJ SOLN
INTRAMUSCULAR | Status: AC
Start: 2017-05-27 — End: 2017-05-27
  Filled 2017-05-27: qty 20

## 2017-05-29 DIAGNOSIS — N401 Enlarged prostate with lower urinary tract symptoms: Secondary | ICD-10-CM | POA: Diagnosis not present

## 2017-05-29 DIAGNOSIS — Z87440 Personal history of urinary (tract) infections: Secondary | ICD-10-CM | POA: Diagnosis not present

## 2017-05-29 DIAGNOSIS — I130 Hypertensive heart and chronic kidney disease with heart failure and stage 1 through stage 4 chronic kidney disease, or unspecified chronic kidney disease: Secondary | ICD-10-CM | POA: Diagnosis not present

## 2017-05-29 DIAGNOSIS — Z435 Encounter for attention to cystostomy: Secondary | ICD-10-CM | POA: Diagnosis not present

## 2017-05-29 DIAGNOSIS — N138 Other obstructive and reflux uropathy: Secondary | ICD-10-CM | POA: Diagnosis not present

## 2017-05-29 DIAGNOSIS — I25110 Atherosclerotic heart disease of native coronary artery with unstable angina pectoris: Secondary | ICD-10-CM | POA: Diagnosis not present

## 2017-06-03 DIAGNOSIS — C61 Malignant neoplasm of prostate: Secondary | ICD-10-CM | POA: Diagnosis not present

## 2017-06-03 DIAGNOSIS — N35012 Post-traumatic membranous urethral stricture: Secondary | ICD-10-CM | POA: Diagnosis not present

## 2017-06-04 ENCOUNTER — Other Ambulatory Visit (HOSPITAL_COMMUNITY): Payer: Self-pay | Admitting: Interventional Radiology

## 2017-06-04 ENCOUNTER — Telehealth (HOSPITAL_COMMUNITY): Payer: Self-pay | Admitting: Radiology

## 2017-06-04 DIAGNOSIS — Z9359 Other cystostomy status: Secondary | ICD-10-CM

## 2017-06-04 NOTE — Telephone Encounter (Signed)
Pt's daughter called requesting to speak to Dr. Vernard Curtis about her dad's suprapubic catheter.  Pt is leaking urine from his penis and little urine is coming back into the drain bag.  Leaking started 05-31-17 and worsened since then.  [facility], but was unable to flush the catheter because of not having the correct adapter for that tube.  NP gave pt some medication that does not seem to be working, complains of leaking continuing to get worse.  The last catheter exchange was on 05/27/17 by Dr. Vernard Curtis.  Daughter was instructed to also call the urology center to inform them of the continued leaking.  Daughter informed that I will pass her contact information to Dr. Vernard Curtis but that I could not give her an estimation of when he could return her call since he is at another facility today.

## 2017-06-05 ENCOUNTER — Ambulatory Visit (HOSPITAL_COMMUNITY)
Admission: RE | Admit: 2017-06-05 | Discharge: 2017-06-05 | Disposition: A | Discharge status: Home / Self Care | Payer: Medicare Other | Source: Ambulatory Visit | Attending: Interventional Radiology | Admitting: Interventional Radiology

## 2017-06-05 ENCOUNTER — Encounter (HOSPITAL_COMMUNITY): Payer: Self-pay | Admitting: Interventional Radiology

## 2017-06-05 DIAGNOSIS — Z960 Presence of urogenital implants: Secondary | ICD-10-CM | POA: Diagnosis not present

## 2017-06-05 DIAGNOSIS — Z9359 Other cystostomy status: Secondary | ICD-10-CM

## 2017-06-05 DIAGNOSIS — Z436 Encounter for attention to other artificial openings of urinary tract: Secondary | ICD-10-CM | POA: Diagnosis not present

## 2017-06-05 HISTORY — PX: IR SINUS/FIST TUBE CHK-NON GI: IMG673

## 2017-06-05 MED ORDER — IOPAMIDOL (ISOVUE-300) INJECTION 61%
INTRAVENOUS | Status: AC
  Filled 2017-06-05: qty 50

## 2017-06-05 MED ORDER — IOPAMIDOL (ISOVUE-300) INJECTION 61%
INTRAVENOUS | Status: AC | PRN
  Administered 2017-06-05: 10 mL

## 2017-06-14 ENCOUNTER — Emergency Department (HOSPITAL_COMMUNITY)
Admission: EM | Admit: 2017-06-14 | Discharge: 2017-06-14 | Disposition: A | Discharge status: Home / Self Care | Payer: Medicare Other | Attending: Emergency Medicine | Admitting: Emergency Medicine

## 2017-06-14 ENCOUNTER — Other Ambulatory Visit: Payer: Self-pay

## 2017-06-14 DIAGNOSIS — Z8546 Personal history of malignant neoplasm of prostate: Secondary | ICD-10-CM | POA: Diagnosis not present

## 2017-06-14 DIAGNOSIS — I2510 Atherosclerotic heart disease of native coronary artery without angina pectoris: Secondary | ICD-10-CM | POA: Insufficient documentation

## 2017-06-14 DIAGNOSIS — S91002A Unspecified open wound, left ankle, initial encounter: Secondary | ICD-10-CM

## 2017-06-14 DIAGNOSIS — Y939 Activity, unspecified: Secondary | ICD-10-CM | POA: Insufficient documentation

## 2017-06-14 DIAGNOSIS — N183 Chronic kidney disease, stage 3 (moderate): Secondary | ICD-10-CM | POA: Diagnosis not present

## 2017-06-14 DIAGNOSIS — Y929 Unspecified place or not applicable: Secondary | ICD-10-CM | POA: Diagnosis not present

## 2017-06-14 DIAGNOSIS — I130 Hypertensive heart and chronic kidney disease with heart failure and stage 1 through stage 4 chronic kidney disease, or unspecified chronic kidney disease: Secondary | ICD-10-CM | POA: Diagnosis not present

## 2017-06-14 DIAGNOSIS — Z7982 Long term (current) use of aspirin: Secondary | ICD-10-CM | POA: Insufficient documentation

## 2017-06-14 DIAGNOSIS — Z87891 Personal history of nicotine dependence: Secondary | ICD-10-CM | POA: Diagnosis not present

## 2017-06-14 DIAGNOSIS — Z79899 Other long term (current) drug therapy: Secondary | ICD-10-CM | POA: Diagnosis not present

## 2017-06-14 DIAGNOSIS — I5022 Chronic systolic (congestive) heart failure: Secondary | ICD-10-CM | POA: Insufficient documentation

## 2017-06-14 DIAGNOSIS — X58XXXA Exposure to other specified factors, initial encounter: Secondary | ICD-10-CM | POA: Diagnosis not present

## 2017-06-14 DIAGNOSIS — Y999 Unspecified external cause status: Secondary | ICD-10-CM | POA: Insufficient documentation

## 2017-06-14 DIAGNOSIS — I252 Old myocardial infarction: Secondary | ICD-10-CM | POA: Diagnosis not present

## 2017-06-14 DIAGNOSIS — Z951 Presence of aortocoronary bypass graft: Secondary | ICD-10-CM | POA: Insufficient documentation

## 2017-06-14 DIAGNOSIS — M25572 Pain in left ankle and joints of left foot: Secondary | ICD-10-CM | POA: Diagnosis present

## 2017-06-14 LAB — BASIC METABOLIC PANEL
Anion gap: 5 (ref 5–15)
BUN: 15 mg/dL (ref 6–20)
CHLORIDE: 104 mmol/L (ref 101–111)
CO2: 28 mmol/L (ref 22–32)
CREATININE: 1.17 mg/dL (ref 0.61–1.24)
Calcium: 8.9 mg/dL (ref 8.9–10.3)
GFR, EST NON AFRICAN AMERICAN: 52 mL/min — AB (ref >60)
Glucose, Bld: 134 mg/dL — ABNORMAL HIGH (ref 65–99)
Potassium: 3.4 mmol/L — ABNORMAL LOW (ref 3.5–5.1)
SODIUM: 137 mmol/L (ref 135–145)

## 2017-06-14 LAB — CBC
HCT: 32.1 % — ABNORMAL LOW (ref 39.0–52.0)
HEMOGLOBIN: 11.3 g/dL — AB (ref 13.0–17.0)
MCH: 32 pg (ref 26.0–34.0)
MCHC: 35.2 g/dL (ref 30.0–36.0)
MCV: 90.9 fL (ref 78.0–100.0)
PLATELETS: 171 10*3/uL (ref 150–400)
RBC: 3.53 MIL/uL — ABNORMAL LOW (ref 4.22–5.81)
RDW: 14 % (ref 11.5–15.5)
WBC: 7.8 10*3/uL (ref 4.0–10.5)

## 2017-06-14 MED ORDER — CEPHALEXIN 250 MG PO CAPS
250.0000 mg | ORAL_CAPSULE | Freq: Once | ORAL | Status: AC
  Administered 2017-06-14: 250 mg via ORAL
  Filled 2017-06-14: qty 1

## 2017-06-14 MED ORDER — CEPHALEXIN 500 MG PO CAPS: 500.0000 mg | ORAL_CAPSULE | Freq: Two times a day (BID) | ORAL | 0 refills | Status: DC

## 2017-06-14 MED ORDER — BACITRACIN ZINC 500 UNIT/GM EX OINT
1.0000 "application " | TOPICAL_OINTMENT | Freq: Once | CUTANEOUS | Status: AC
  Administered 2017-06-14: 1 via TOPICAL
  Filled 2017-06-14: qty 0.9

## 2017-06-14 NOTE — ED Triage Notes (Signed)
Pt BIB POV with daughter. Pt has a 0.5 inch ulcer to his lateral malleolus on the left. It is not weeping/draining. It does have a dark spot in the center with edema around the site. The pt has pitting edema to the left lower leg. No signs of systemic infection present.

## 2017-06-14 NOTE — Discharge Instructions (Signed)
Take the antibiotics as prescribed, follow-up with your primary care doctor to make sure the wound is improving, monitor for fevers worsening symptoms

## 2017-06-14 NOTE — ED Provider Notes (Signed)
[facility] DEPT Provider Note   CSN: [id number] Arrival date & time: 06/14/17  2052     History   Chief Complaint Chief Complaint  Patient presents with  . Skin Ulcer    HPI Henry Curtis is a 83 y.o. male.  HPI Patient presents to the emergency room for evaluation of a wound on the lateral aspect of his left ankle.  Patient lives at home.  His caregiver noticed a small wound on the lateral aspect of his ankle the other day.  Today it seemed to open up and turned into an ulceration.  Patient's daughter noticed a little bit of erythema around the margins.  He has a history of having trouble with sepsis in the past.  He also has a history of neuropathy associated with a frostbite injury during the war.  The daughter was concerned that this may be spread rapidly so she brought him in for evaluation.  He denies any trouble with any nausea or vomiting.  No fevers or chills. Past Medical History:  Diagnosis Date  . Clostridium difficile infection    2015  . Coronary artery disease involving native coronary artery 12/1989   Unstable Angina  . Hearing difficulty   . High cholesterol   . Hypertension   . Memory difficulty   . Myocardial infarction ([place])   . Prostate cancer ([place])   . Prostate cancer ([place])   . S/P CABG x 3 12/27/1989   LIMA-D1-LAD, SVG-rPDA (PDA Endarterectomy & patch andioplasty)   . Toenail fungus   . Urinary (tract) obstruction   . UTI (urinary tract infection)     Patient Active Problem List   Diagnosis Date Noted  . Chronic systolic CHF (congestive heart failure) ([place]) 03/12/2017  . Complicated UTI (urinary tract infection) 03/11/2017  . Sepsis ([place]) 03/11/2017  . CKD (chronic kidney disease), stage III ([place]) 03/11/2017  . Acute metabolic encephalopathy 13/24/4010  . Alzheimer disease 10/25/2016  . Memory loss 01/20/2015  . Urinary retention 01/20/2015  . Gait disturbance 01/20/2015  . Vitamin D deficiency 01/20/2015  .  Hematuria 12/21/2014  . Pressure ulcer 12/21/2014  . Hyperlipidemia 07/07/2014  . Enteritis due to Clostridium difficile 06/29/2014  . Clostridial gastroenteritis 06/28/2014  . Acute urinary retention   . Other specified hypotension   . Non Q wave myocardial infarction ([place]) 06/23/2014  . Acute renal failure ([place]) 06/23/2014  . Acute encephalopathy 06/23/2014  . Essential hypertension 06/23/2014  . Non-ST elevation (NSTEMI) myocardial infarction ([place])   . CAD - s/p CABP 12/1989; LIMA-D1-LAD, SVG-rPDA (PDA Endarterectomy & patch andioplasty) 12/27/1989    Past Surgical History:  Procedure Laterality Date  . APPENDECTOMY    . CORONARY ARTERY BYPASS GRAFT    . CYSTOSCOPY WITH URETHRAL DILATATION N/A 12/21/2014   Procedure: CYSTOSCOPY WITH RETROGRADE AND URETHRAL DILATATION ;  Surgeon: Alexis Frock, MD;  Location: [facility];  Service: Urology;  Laterality: N/A;  . IR CATHETER TUBE CHANGE  04/15/2017  . IR CATHETER TUBE CHANGE  04/29/2017  . IR CATHETER TUBE CHANGE  05/27/2017  . IR SINUS/FIST TUBE CHK-NON GI  06/05/2017  . LEFT HEART CATHETERIZATION WITH CORONARY/GRAFT ANGIOGRAM N/A 06/25/2014   Procedure: LEFT HEART CATHETERIZATION WITH Beatrix Fetters;  Surgeon: Leonie Man, MD;  Location: [facility] CATH LAB;  Service: Cardiovascular;  Laterality: N/A;  . MULTIPLE TOOTH EXTRACTIONS Bilateral        Home Medications    Prior to Admission medications   Medication Sig Start Date End Date  Taking? Authorizing Provider  aspirin 81 MG tablet Take 81 mg by mouth daily.   Yes [provider]  atorvastatin (LIPITOR) 40 MG tablet Take 40 mg by mouth daily.   Yes [provider]  Cholecalciferol (VITAMIN D-3 PO) Take 1,000 Units by mouth daily.    Yes [provider]  Cyanocobalamin (VITAMIN B-12 PO) Take 1,000 mcg by mouth daily.    Yes [provider]  donepezil (ARICEPT) 10 MG tablet Take 1 tablet (10 mg total) by mouth at bedtime. 10/25/16  Yes Sater,  Nanine Means, MD  finasteride (PROSCAR) 5 MG tablet Take 5 mg by mouth daily.   Yes [provider]  Melatonin 10 MG TABS Take 10 mg by mouth at bedtime.    Yes [provider]  mirabegron ER (MYRBETRIQ) 25 MG TB24 tablet Take 25 mg by mouth daily.   Yes [provider]  Omega-3 Fatty Acids (FISH OIL) 1200 MG CAPS Take 1,200 mg by mouth daily.   Yes [provider]  Probiotic Product (PROBIOTIC & ACIDOPHILUS EX ST PO) Take 1 tablet by mouth 2 (two) times daily.    Yes [provider]  cephALEXin (KEFLEX) 500 MG capsule Take 1 capsule (500 mg total) by mouth 2 (two) times daily. 06/14/17   Dorie Rank, MD  ciprofloxacin (CIPRO) 500 MG tablet Take 1 tablet (500 mg total) by mouth 2 (two) times daily. Patient not taking: Reported on 06/14/2017 04/28/17   Montine Circle, PA-C    Family History Family History  Problem Relation Age of Onset  . Diabetes Father   . Deep vein thrombosis Father   . Neuropathy Sister   . Heart attack Brother     Social History Social History   Tobacco Use  . Smoking status: Former Research scientist (life sciences)  . Smokeless tobacco: Never Used  Substance Use Topics  . Alcohol use: No  . Drug use: No     Allergies   Patient has no known allergies.   Review of Systems Review of Systems  All other systems reviewed and are negative.    Physical Exam Updated Vital Signs BP (!) 149/73 (BP Location: Right Arm)   Pulse 92   Temp 98.1 F (36.7 C) (Oral)   Resp 16   Ht 1.753 m (5\' 9" )   Wt 79.4 kg (175 lb)   SpO2 99%   BMI 25.84 kg/m   Physical Exam  Constitutional: He appears well-developed and well-nourished. No distress.  HENT:  Head: Normocephalic and atraumatic.  Right Ear: External ear normal.  Left Ear: External ear normal.  Eyes: Conjunctivae are normal. Right eye exhibits no discharge. Left eye exhibits no discharge. No scleral icterus.  Neck: Neck supple. No tracheal deviation present.  Cardiovascular: Normal  rate, regular rhythm and normal heart sounds.  Pulmonary/Chest: Effort normal and breath sounds normal. No stridor. No respiratory distress. He has no wheezes. He has no rales.  Abdominal: He exhibits no distension.  Musculoskeletal: He exhibits tenderness. He exhibits no edema.       Left ankle: Tenderness. Lateral malleolus tenderness found.  Small approximately 1 cm oval ulceration around the lateral malleolus, mild erythema around the wound margins, no lymphangitic streaking  Neurological: He is alert. Cranial nerve deficit: no gross deficits.  Skin: Skin is warm and dry. No rash noted.  Psychiatric: He has a normal mood and affect.  Nursing note and vitals reviewed.      ED Treatments / Results  Labs (all labs ordered are listed,  but only abnormal results are displayed) Labs Reviewed  CBC - Abnormal; Notable for the following components:      Result Value   RBC 3.53 (*)    Hemoglobin 11.3 (*)    HCT 32.1 (*)    All other components within normal limits  BASIC METABOLIC PANEL - Abnormal; Notable for the following components:   Potassium 3.4 (*)    Glucose, Bld 134 (*)    GFR calc non Af Amer 52 (*)    All other components within normal limits     Radiology No results found.  Procedures Procedures (including critical care time)  Medications Ordered in ED Medications  bacitracin ointment 1 application (not administered)  cephALEXin (KEFLEX) capsule 250 mg (not administered)     Initial Impression / Assessment and Plan / ED Course  I have reviewed the triage vital signs and the nursing notes.  Pertinent labs & imaging results that were available during my care of the patient were reviewed by me and considered in my medical decision making (see chart for details).   Patient's laboratory tests are reassuring.  He is afebrile.  No signs of any severe infection.  There is tenderness and mild erythema around the wound ulceration.  I will give him a prescription for  Keflex.  Follow-up with his primary care doctor.  Will apply dressing to help alleviate any pressure  Final Clinical Impressions(s) / ED Diagnoses   Final diagnoses:  Wound of left ankle, initial encounter    ED Discharge Orders        Ordered    cephALEXin (KEFLEX) 500 MG capsule  2 times daily     06/14/17 2307       Dorie Rank, MD 06/14/17 2308

## 2017-06-17 DIAGNOSIS — N35012 Post-traumatic membranous urethral stricture: Secondary | ICD-10-CM | POA: Diagnosis not present

## 2017-06-17 DIAGNOSIS — C61 Malignant neoplasm of prostate: Secondary | ICD-10-CM | POA: Diagnosis not present

## 2017-06-22 ENCOUNTER — Emergency Department (HOSPITAL_COMMUNITY)
Admission: EM | Admit: 2017-06-22 | Discharge: 2017-06-22 | Disposition: A | Discharge status: Home / Self Care | Payer: Medicare Other | Attending: Emergency Medicine | Admitting: Emergency Medicine

## 2017-06-22 ENCOUNTER — Encounter (HOSPITAL_COMMUNITY): Payer: Self-pay | Admitting: Emergency Medicine

## 2017-06-22 ENCOUNTER — Other Ambulatory Visit: Payer: Self-pay

## 2017-06-22 DIAGNOSIS — Z7982 Long term (current) use of aspirin: Secondary | ICD-10-CM | POA: Insufficient documentation

## 2017-06-22 DIAGNOSIS — I5022 Chronic systolic (congestive) heart failure: Secondary | ICD-10-CM | POA: Diagnosis not present

## 2017-06-22 DIAGNOSIS — N390 Urinary tract infection, site not specified: Secondary | ICD-10-CM | POA: Insufficient documentation

## 2017-06-22 DIAGNOSIS — I110 Hypertensive heart disease with heart failure: Secondary | ICD-10-CM | POA: Insufficient documentation

## 2017-06-22 DIAGNOSIS — F0390 Unspecified dementia without behavioral disturbance: Secondary | ICD-10-CM | POA: Diagnosis not present

## 2017-06-22 DIAGNOSIS — Z87891 Personal history of nicotine dependence: Secondary | ICD-10-CM | POA: Diagnosis not present

## 2017-06-22 DIAGNOSIS — R339 Retention of urine, unspecified: Secondary | ICD-10-CM | POA: Diagnosis present

## 2017-06-22 DIAGNOSIS — I25110 Atherosclerotic heart disease of native coronary artery with unstable angina pectoris: Secondary | ICD-10-CM | POA: Insufficient documentation

## 2017-06-22 DIAGNOSIS — Z79899 Other long term (current) drug therapy: Secondary | ICD-10-CM | POA: Insufficient documentation

## 2017-06-22 HISTORY — DX: Unspecified dementia, unspecified severity, without behavioral disturbance, psychotic disturbance, mood disturbance, and anxiety: F03.90

## 2017-06-22 LAB — CBC WITH DIFFERENTIAL/PLATELET
Basophils Absolute: 0.1 10*3/uL (ref 0.0–0.1)
Basophils Relative: 1 %
Eosinophils Absolute: 0.4 10*3/uL (ref 0.0–0.7)
Eosinophils Relative: 5 %
HEMATOCRIT: 35.4 % — AB (ref 39.0–52.0)
HEMOGLOBIN: 12.2 g/dL — AB (ref 13.0–17.0)
LYMPHS ABS: 2.1 10*3/uL (ref 0.7–4.0)
LYMPHS PCT: 27 %
MCH: 31.6 pg (ref 26.0–34.0)
MCHC: 34.5 g/dL (ref 30.0–36.0)
MCV: 91.7 fL (ref 78.0–100.0)
Monocytes Absolute: 1.3 10*3/uL — ABNORMAL HIGH (ref 0.1–1.0)
Monocytes Relative: 17 %
NEUTROS ABS: 3.8 10*3/uL (ref 1.7–7.7)
NEUTROS PCT: 50 %
Platelets: 215 10*3/uL (ref 150–400)
RBC: 3.86 MIL/uL — AB (ref 4.22–5.81)
RDW: 14.2 % (ref 11.5–15.5)
WBC: 7.5 10*3/uL (ref 4.0–10.5)

## 2017-06-22 LAB — BASIC METABOLIC PANEL
ANION GAP: 5 (ref 5–15)
BUN: 16 mg/dL (ref 6–20)
CALCIUM: 9.1 mg/dL (ref 8.9–10.3)
CO2: 28 mmol/L (ref 22–32)
Chloride: 107 mmol/L (ref 101–111)
Creatinine, Ser: 1.12 mg/dL (ref 0.61–1.24)
GFR calc non Af Amer: 55 mL/min — ABNORMAL LOW (ref >60)
GLUCOSE: 109 mg/dL — AB (ref 65–99)
POTASSIUM: 3.6 mmol/L (ref 3.5–5.1)
Sodium: 140 mmol/L (ref 135–145)

## 2017-06-22 LAB — URINALYSIS, ROUTINE W REFLEX MICROSCOPIC
Bilirubin Urine: NEGATIVE
Glucose, UA: NEGATIVE mg/dL
Ketones, ur: NEGATIVE mg/dL
Nitrite: NEGATIVE
Protein, ur: 100 mg/dL — AB
SPECIFIC GRAVITY, URINE: 1.018 (ref 1.005–1.030)
pH: 6 (ref 5.0–8.0)

## 2017-06-22 MED ORDER — SULFAMETHOXAZOLE-TRIMETHOPRIM 800-160 MG PO TABS: 1.0000 | ORAL_TABLET | Freq: Two times a day (BID) | ORAL | 0 refills | Status: AC

## 2017-06-22 NOTE — ED Triage Notes (Signed)
Pt family reports that pt suprapubic catheter has become clogged today and pt has been having increasing confusion. Concern from family with UTI. Pt currently alert and oriented x 4.

## 2017-06-22 NOTE — Discharge Instructions (Signed)
Have his doctor follow the culture to see if he needs to continue the antibiotics and if the infection is susceptible also.

## 2017-06-22 NOTE — ED Provider Notes (Signed)
[facility] DEPT Provider Note   CSN: [id number] Arrival date & time: 06/22/17  1825     History   Chief Complaint Chief Complaint  Patient presents with  . Urinary Retention    HPI Henry Curtis is a 83 y.o. male.  HPI Patient presents with some generalized weakness and confusion.  Has 24-hour care at home.  Has a suprapubic catheter but has had somewhat decreased output per caregiver.  Has 200 cc of urine out right now.  States last change this morning.  Most of the history comes from the caregiver.  Patient is awake and able to answer most questions.  Has some mild lower abdominal pain.  No fevers.  No chest pain.  States he feels all right.  He has been on and off antibiotics for urinary tract infections and ulcers. Past Medical History:  Diagnosis Date  . Clostridium difficile infection    2015  . Coronary artery disease involving native coronary artery 12/1989   Unstable Angina  . Dementia    per family  . Hearing difficulty   . High cholesterol   . Hypertension   . Memory difficulty   . Myocardial infarction ([place])   . Prostate cancer ([place])   . Prostate cancer ([place])   . S/P CABG x 3 12/27/1989   LIMA-D1-LAD, SVG-rPDA (PDA Endarterectomy & patch andioplasty)   . Toenail fungus   . Urinary (tract) obstruction   . UTI (urinary tract infection)     Patient Active Problem List   Diagnosis Date Noted  . Chronic systolic CHF (congestive heart failure) ([place]) 03/12/2017  . Complicated UTI (urinary tract infection) 03/11/2017  . Sepsis ([place]) 03/11/2017  . CKD (chronic kidney disease), stage III ([place]) 03/11/2017  . Acute metabolic encephalopathy 37/04/6268  . Alzheimer disease 10/25/2016  . Memory loss 01/20/2015  . Urinary retention 01/20/2015  . Gait disturbance 01/20/2015  . Vitamin D deficiency 01/20/2015  . Hematuria 12/21/2014  . Pressure ulcer 12/21/2014  . Hyperlipidemia 07/07/2014  . Enteritis due to Clostridium difficile  06/29/2014  . Clostridial gastroenteritis 06/28/2014  . Acute urinary retention   . Other specified hypotension   . Non Q wave myocardial infarction ([place]) 06/23/2014  . Acute renal failure ([place]) 06/23/2014  . Acute encephalopathy 06/23/2014  . Essential hypertension 06/23/2014  . Non-ST elevation (NSTEMI) myocardial infarction ([place])   . CAD - s/p CABP 12/1989; LIMA-D1-LAD, SVG-rPDA (PDA Endarterectomy & patch andioplasty) 12/27/1989    Past Surgical History:  Procedure Laterality Date  . APPENDECTOMY    . CORONARY ARTERY BYPASS GRAFT    . CYSTOSCOPY WITH URETHRAL DILATATION N/A 12/21/2014   Procedure: CYSTOSCOPY WITH RETROGRADE AND URETHRAL DILATATION ;  Surgeon: Alexis Frock, MD;  Location: [facility] ORS;  Service: Urology;  Laterality: N/A;  . IR CATHETER TUBE CHANGE  04/15/2017  . IR CATHETER TUBE CHANGE  04/29/2017  . IR CATHETER TUBE CHANGE  05/27/2017  . IR SINUS/FIST TUBE CHK-NON GI  06/05/2017  . LEFT HEART CATHETERIZATION WITH CORONARY/GRAFT ANGIOGRAM N/A 06/25/2014   Procedure: LEFT HEART CATHETERIZATION WITH Beatrix Fetters;  Surgeon: Leonie Man, MD;  Location: [facility] CATH LAB;  Service: Cardiovascular;  Laterality: N/A;  . MULTIPLE TOOTH EXTRACTIONS Bilateral        Home Medications    Prior to Admission medications   Medication Sig Start Date End Date Taking? Authorizing Provider  aspirin 81 MG tablet Take 81 mg by mouth daily.    [provider]  atorvastatin (LIPITOR) 40  MG tablet Take 40 mg by mouth daily.    [provider]  cephALEXin (KEFLEX) 500 MG capsule Take 1 capsule (500 mg total) by mouth 2 (two) times daily. 06/14/17   Dorie Rank, MD  Cholecalciferol (VITAMIN D-3 PO) Take 1,000 Units by mouth daily.     [provider]  ciprofloxacin (CIPRO) 500 MG tablet Take 1 tablet (500 mg total) by mouth 2 (two) times daily. Patient not taking: Reported on 06/14/2017 04/28/17   Montine Circle, PA-C  Cyanocobalamin (VITAMIN B-12 PO)  Take 1,000 mcg by mouth daily.     [provider]  donepezil (ARICEPT) 10 MG tablet Take 1 tablet (10 mg total) by mouth at bedtime. 10/25/16   Sater, Nanine Means, MD  finasteride (PROSCAR) 5 MG tablet Take 5 mg by mouth daily.    [provider]  Melatonin 10 MG TABS Take 10 mg by mouth at bedtime.     [provider]  mirabegron ER (MYRBETRIQ) 25 MG TB24 tablet Take 25 mg by mouth daily.    [provider]  Omega-3 Fatty Acids (FISH OIL) 1200 MG CAPS Take 1,200 mg by mouth daily.    [provider]  Probiotic Product (PROBIOTIC & ACIDOPHILUS EX ST PO) Take 1 tablet by mouth 2 (two) times daily.     [provider]  sulfamethoxazole-trimethoprim (BACTRIM DS,SEPTRA DS) 800-160 MG tablet Take 1 tablet by mouth 2 (two) times daily for 7 days. 06/22/17 06/29/17  Davonna Belling, MD    Family History Family History  Problem Relation Age of Onset  . Diabetes Father   . Deep vein thrombosis Father   . Neuropathy Sister   . Heart attack Brother     Social History Social History   Tobacco Use  . Smoking status: Former Research scientist (life sciences)  . Smokeless tobacco: Never Used  Substance Use Topics  . Alcohol use: No  . Drug use: No     Allergies   Patient has no known allergies.   Review of Systems Review of Systems  Constitutional: Positive for appetite change.  HENT: Negative for congestion.   Respiratory: Negative for shortness of breath.   Cardiovascular: Negative for chest pain.  Gastrointestinal: Positive for abdominal pain.  Genitourinary: Positive for decreased urine volume.  Neurological: Negative for weakness.  Hematological: Negative for adenopathy.  Psychiatric/Behavioral: Positive for confusion.     Physical Exam Updated Vital Signs BP (!) 136/57   Pulse 63   Temp 98.4 F (36.9 C) (Oral)   Resp 18   Ht 5\' 9"  (1.753 m)   Wt 72.6 kg (160 lb)   SpO2 99%   BMI 23.63 kg/m   Physical Exam  Constitutional: He appears  well-developed and well-nourished.  HENT:  Head: Normocephalic.  Eyes: Pupils are equal, round, and reactive to light.  Neck: Neck supple.  Cardiovascular: Normal rate.  Pulmonary/Chest: Effort normal.  Abdominal: There is tenderness.  Suprapubic catheter in place.  Some tenderness around the site without clear mass.  Musculoskeletal: He exhibits no tenderness.  Neurological: He is alert.  Patient is awake and able to answer questions but most the history comes from family member.  Skin: Skin is warm. Capillary refill takes less than 2 seconds.     ED Treatments / Results  Labs (all labs ordered are listed, but only abnormal results are displayed) Labs Reviewed  URINALYSIS, ROUTINE W REFLEX MICROSCOPIC - Abnormal; Notable for the following components:      Result Value   Color, Urine  AMBER (*)    APPearance CLOUDY (*)    Hgb urine dipstick LARGE (*)    Protein, ur 100 (*)    Leukocytes, UA LARGE (*)    Bacteria, UA RARE (*)    Squamous Epithelial / LPF 0-5 (*)    All other components within normal limits  CBC WITH DIFFERENTIAL/PLATELET - Abnormal; Notable for the following components:   RBC 3.86 (*)    Hemoglobin 12.2 (*)    HCT 35.4 (*)    Monocytes Absolute 1.3 (*)    All other components within normal limits  BASIC METABOLIC PANEL - Abnormal; Notable for the following components:   Glucose, Bld 109 (*)    GFR calc non Af Amer 55 (*)    All other components within normal limits  URINE CULTURE    EKG  EKG Interpretation None       Radiology No results found.  Procedures Procedures (including critical care time)  Medications Ordered in ED Medications - No data to display   Initial Impression / Assessment and Plan / ED Course  I have reviewed the triage vital signs and the nursing notes.  Pertinent labs & imaging results that were available during my care of the patient were reviewed by me and considered in my medical decision making (see chart for  details).     Patient with dysuria and some confusion.  Chronic suprapubic catheter.  Patient's caregiver thinks could be obstructed but there was no excess urine on bladder scan.  It was however irrigated and some clogged tubing changed.  Patient has reportedly had more confusion and this could be due to an urinary tract infection.  Reportedly has frequent infections.  Urine has white cells but not very many bacteria.  Discussed with family member and will empirically treat but culture has been sent and can be followed to see if the medicines can be stopped or if the infection is susceptible.  Discussed possibility of waiting for results of culture before starting antibiotics but the decision was made to begin now.  Discharge home.  Final Clinical Impressions(s) / ED Diagnoses   Final diagnoses:  Lower urinary tract infectious disease    ED Discharge Orders        Ordered    sulfamethoxazole-trimethoprim (BACTRIM DS,SEPTRA DS) 800-160 MG tablet  2 times daily     06/22/17 2220       Davonna Belling, MD 06/22/17 2339

## 2017-06-25 LAB — URINE CULTURE

## 2017-06-26 ENCOUNTER — Telehealth: Payer: Self-pay | Admitting: Emergency Medicine

## 2017-06-26 NOTE — Progress Notes (Signed)
ED Antimicrobial Stewardship Positive Culture Follow Up   Henry Curtis is an 83 y.o. male who presented to [facility] on 06/22/2017 with a chief complaint of  Chief Complaint  Patient presents with  . Urinary Retention    Recent Results (from the past 720 hour(s))  Urine culture     Status: Abnormal   Collection Time: 06/22/17  8:50 PM  Result Value Ref Range Status   Specimen Description URINE, RANDOM  Final   Special Requests NONE  Final   Culture >=100,000 COLONIES/mL PSEUDOMONAS AERUGINOSA (A)  Final   Report Status 06/25/2017 FINAL  Final   Organism ID, Bacteria PSEUDOMONAS AERUGINOSA (A)  Final      Susceptibility   Pseudomonas aeruginosa - MIC*    CEFTAZIDIME 4 SENSITIVE Sensitive     CIPROFLOXACIN <=0.25 SENSITIVE Sensitive     GENTAMICIN <=1 SENSITIVE Sensitive     IMIPENEM 1 SENSITIVE Sensitive     PIP/TAZO 8 SENSITIVE Sensitive     CEFEPIME 2 SENSITIVE Sensitive     * >=100,000 COLONIES/mL PSEUDOMONAS AERUGINOSA    [x]  Treated with Bactrim, organism not covered by the prescribed antimicrobial  New antibiotic prescription: Cipro 250 mg twice daily for 3 days  ED Provider: Delia Heady, PA-C  Lawson Radar 06/26/2017, 8:57 AM Infectious Diseases Pharmacist Phone# [phone]

## 2017-06-26 NOTE — Telephone Encounter (Signed)
Post ED Visit - Positive Culture Follow-up: Successful Patient Follow-Up  Culture assessed and recommendations reviewed by: []  Elenor Quinones, Pharm.D. []  Heide Guile, Pharm.D., BCPS AQ-ID []  Parks Neptune, Pharm.D., BCPS [x]  Alycia Rossetti, Pharm.D., BCPS []  [place], Pharm.D., BCPS, AAHIVP []  Legrand Como, Pharm.D., BCPS, AAHIVP []  Salome Arnt, PharmD, BCPS []  Dimitri Ped, PharmD, BCPS []  Vincenza Hews, PharmD, BCPS  Positive urine culture  []  Patient discharged without antimicrobial prescription and treatment is now indicated [x]  Organism is resistant to prescribed ED discharge antimicrobial []  Patient with positive blood cultures  Changes discussed with ED provider: Delia Heady PA New antibiotic prescription Stop Bactrim, Start cipro 250mg  po bid x 3 days Called to [place] @ [phone]  Contacted daughter 06/26/2017 1410   Hazle Nordmann 06/26/2017, 2:11 PM

## 2017-06-28 DIAGNOSIS — R21 Rash and other nonspecific skin eruption: Secondary | ICD-10-CM | POA: Diagnosis not present

## 2017-06-28 DIAGNOSIS — R634 Abnormal weight loss: Secondary | ICD-10-CM | POA: Diagnosis not present

## 2017-06-28 DIAGNOSIS — L909 Atrophic disorder of skin, unspecified: Secondary | ICD-10-CM | POA: Diagnosis not present

## 2017-07-08 ENCOUNTER — Ambulatory Visit: Payer: Medicare Other | Admitting: Podiatry

## 2017-07-08 DIAGNOSIS — C61 Malignant neoplasm of prostate: Secondary | ICD-10-CM | POA: Diagnosis not present

## 2017-07-11 DIAGNOSIS — N138 Other obstructive and reflux uropathy: Secondary | ICD-10-CM | POA: Diagnosis not present

## 2017-07-11 DIAGNOSIS — Z435 Encounter for attention to cystostomy: Secondary | ICD-10-CM | POA: Diagnosis not present

## 2017-07-11 DIAGNOSIS — Z87440 Personal history of urinary (tract) infections: Secondary | ICD-10-CM | POA: Diagnosis not present

## 2017-07-11 DIAGNOSIS — I25110 Atherosclerotic heart disease of native coronary artery with unstable angina pectoris: Secondary | ICD-10-CM | POA: Diagnosis not present

## 2017-07-19 ENCOUNTER — Ambulatory Visit (INDEPENDENT_AMBULATORY_CARE_PROVIDER_SITE_OTHER): Payer: Medicare Other | Admitting: Podiatry

## 2017-07-19 DIAGNOSIS — B351 Tinea unguium: Secondary | ICD-10-CM | POA: Diagnosis not present

## 2017-07-19 DIAGNOSIS — M79609 Pain in unspecified limb: Secondary | ICD-10-CM

## 2017-07-19 NOTE — Progress Notes (Signed)
Patient ID: Henry Curtis, male   DOB: 04-Nov-1921, 83 y.o.   MRN: [id number]  Subjective: 83 y.o. returns the office today for painful, elongated, thickened toenails which he is unable to trim himself. Denies any redness or drainage around the nails. He has bandages on pressure wounds on  His heels but his caregiver does not want the bandages changed. Denies any acute changes since last appointment and no new complaints today. Denies any systemic complaints such as fevers, chills, nausea, vomiting.   Objective: AAO 3, NAD DP/PT pulses palpable 1/4, CRT less than 3 seconds Nails hypertrophic, dystrophic, elongated, brittle, discolored 10. There is tenderness overlying the nails 1-5 bilaterally. There is no surrounding erythema or drainage along the nail sites.  No open sorse today.  No open lesions or pre-ulcerative lesions are identified. No other areas of tenderness bilateral lower extremities. No overlying edema, erythema, increased warmth. No pain with calf compression, swelling, warmth, erythema.  Assessment: Patient presents with symptomatic onychomycosis  Plan: -Treatment options including alternatives, risks, complications were discussed -Nails sharply debrided 10 without complication/bleeding. -Continue bandages as ordered and he is under the care of another physician for this.  -Discussed daily foot inspection. If there are any changes, to call the office immediately.  -Follow-up in 3 months or sooner if any problems are to arise. In the meantime, encouraged to call the office with any questions, concerns, changes symptoms. Will seem him at 9 weeks as the hallux nails are causing pressure to the distal portions of the toes.   Celesta Gentile, DPM

## 2017-08-05 NOTE — Progress Notes (Signed)
Cardiology Office Note   Date:  08/06/2017   ID:  Henry Curtis, DOB 01-05-1922, MRN [id number]  PCP:  Henry Pretty, MD  Cardiologist: Dr. Stanford Curtis Chief Complaint  Patient presents with  . Follow-up  . Coronary Artery Disease     History of Present Illness: Henry Curtis is a 84 y.o. male who presents for ongoing assessment and management of coronary artery disease, with history of CABG in 1991 (LIMA to diagonal and LAD, SVG to PDA).  Patient did have a recent cardiac catheterization in December 2015 which revealed an occluded LAD, 95% lesion in the LAD and distal to the LIMA insertion.  There was another 95% lesion distal to that.  The catheterization revealed extensive collaterals to the distal LAD.  The circumflex was found to be occluded.  There was also bridging collaterals into the second and third marginal.  The right coronary artery was also found to be occluded as well as SVG to the RCA.  The patient underwent PCI of the distal LAD and DES and PTCA at that time.  Patient was last seen in the office by Dr. Stanford Curtis on 08/24/2016.  The patient was clinically stable without any changes in medication.  No planned ischemic testing was ordered.  The patient's daughter called [place] on 05/02/2017 due to hypotension with blood pressure 77/61, 99/54.  Cozaar and beta-blocker were discontinued.  He is here today with CNA, in a wheelchair, she reports that his blood pressures have remained low.  And RN does come in once a week and take his blood pressure manually but for the most part they are using a wrist blood pressure monitoring device.  Blood pressures are ranging anywhere from 84/48-200 and 125/76.  The higher numbers are  Manual blood pressures.  He is very frail, does not get up and walk around very much, remains sedentary.  N/A says that he is usually very unsteady when he stands up and appears to fall, she has to hold on to him to make sure he continues to be upright.  There is an  uncertainty if this was related to deconditioning versus hypotension.   Past Medical History:  Diagnosis Date  . Clostridium difficile infection    2015  . Coronary artery disease involving native coronary artery 12/1989   Unstable Angina  . Dementia    per family  . Hearing difficulty   . High cholesterol   . Hypertension   . Memory difficulty   . Myocardial infarction ([place])   . Prostate cancer ([place])   . Prostate cancer ([place])   . S/P CABG x 3 12/27/1989   LIMA-D1-LAD, SVG-rPDA (PDA Endarterectomy & patch andioplasty)   . Toenail fungus   . Urinary (tract) obstruction   . UTI (urinary tract infection)     Past Surgical History:  Procedure Laterality Date  . APPENDECTOMY    . CORONARY ARTERY BYPASS GRAFT    . CYSTOSCOPY WITH URETHRAL DILATATION N/A 12/21/2014   Procedure: CYSTOSCOPY WITH RETROGRADE AND URETHRAL DILATATION ;  Surgeon: Henry Frock, MD;  Location: [facility];  Service: Urology;  Laterality: N/A;  . IR CATHETER TUBE CHANGE  04/15/2017  . IR CATHETER TUBE CHANGE  04/29/2017  . IR CATHETER TUBE CHANGE  05/27/2017  . IR SINUS/FIST TUBE CHK-NON GI  06/05/2017  . LEFT HEART CATHETERIZATION WITH CORONARY/GRAFT ANGIOGRAM N/A 06/25/2014   Procedure: LEFT HEART CATHETERIZATION WITH Henry Curtis;  Surgeon: Henry Man, MD;  Location: [facility] CATH [facility];  Service: Cardiovascular;  Laterality: N/A;  . MULTIPLE TOOTH EXTRACTIONS Bilateral      Current Outpatient Medications  Medication Sig Dispense Refill  . aspirin 81 MG tablet Take 81 mg by mouth daily.    Marland Kitchen atorvastatin (LIPITOR) 40 MG tablet Take 40 mg by mouth daily.    . Cholecalciferol (VITAMIN D-3 PO) Take 1,000 Units by mouth daily.     . Cyanocobalamin (VITAMIN B-12 PO) Take 1,000 mcg by mouth daily.     Marland Kitchen donepezil (ARICEPT) 10 MG tablet Take 1 tablet (10 mg total) by mouth at bedtime. 30 tablet 5  . finasteride (PROSCAR) 5 MG tablet Take 5 mg by mouth daily.    . Melatonin 10 MG TABS Take 10 mg by mouth at  bedtime.     . Omega-3 Fatty Acids (FISH OIL) 1200 MG CAPS Take 1,200 mg by mouth daily.    . Probiotic Product (PROBIOTIC & ACIDOPHILUS EX ST PO) Take 1 tablet by mouth 2 (two) times daily.      No current facility-administered medications for this visit.     Allergies:   Patient has no known allergies.    Social History:  The patient  reports that he has quit smoking. he has never used smokeless tobacco. He reports that he does not drink alcohol or use drugs.   Family History:  The patient's family history includes Deep vein thrombosis in his father; Diabetes in his father; Heart attack in his brother; Neuropathy in his sister.    ROS: All other systems are reviewed and negative. Unless otherwise mentioned in H&P    PHYSICAL EXAM: VS:  BP 119/61   Pulse 66   Ht 5\' 9"  (1.753 m)   Wt 142 lb (64.4 kg)   BMI 20.97 kg/m  , BMI Body mass index is 20.97 kg/m. GEN: Well nourished, well developed, in no acute distress Frail the HEENT: normal  Neck: no JVD, carotid bruits, or masses Cardiac: RRR; no murmurs, rubs, or gallops,no edema  Respiratory:  Clear to auscultation bilaterally, normal work of breathing GI: soft, nontender, nondistended, + BS MS: no deformity or atrophy  Skin: warm and dry, no rash Neuro:  Strength and sensation are intact Psych: euthymic mood, full affect   Recent Labs: 03/11/2017: ALT 14 03/12/2017: B Natriuretic Peptide 530.7 06/22/2017: BUN 16; Creatinine, Ser 1.12; Hemoglobin 12.2; Platelets 215; Potassium 3.6; Sodium 140    Lipid Panel    Component Value Date/Time   CHOL 147 06/24/2014 0500   TRIG 57 06/24/2014 0500   HDL 42 06/24/2014 0500   CHOLHDL 3.5 06/24/2014 0500   VLDL 11 06/24/2014 0500   LDLCALC 94 06/24/2014 0500      Wt Readings from Last 3 Encounters:  08/06/17 142 lb (64.4 kg)  06/22/17 160 lb (72.6 kg)  06/14/17 175 lb (79.4 kg)      Other studies Reviewed:  Echocardiogram 06/24/2014  Procedure narrative: Transthoracic  echocardiography. Technically difficult study. Definity contrast was administered. - Left ventricle: The cavity size was normal. Wall thickness was increased in a pattern of mild LVH. Systolic function was mildly reduced. The estimated ejection fraction was in the range of 45% to 50%. The study is not technically sufficient to allow evaluation of LV diastolic function. - Aortic valve: Moderately calcified with leaflet restriction. Mild to moderate aortic stenosis. Mean gradient (S): 9 mm Hg. Peak gradient (S): 16 mm Hg. Valve area (VTI): 1.24 cm^2. Valve area (Vmax): 1.31 cm^2. Valve area (Vmean): 1.19 cm^2. - Mitral valve: Mildly thickened  leaflets . There was moderate regurgitation. - Left atrium: The atrium was normal in size. - Inferior vena cava: The vessel was normal in size. The respirophasic diameter changes were in the normal range (= 50%), consistent with normal central venous pressure.  Impressions:  - Technically difficult study. LVEF 45-50%, inferior hypokinesis, mild LVH, mild to moderate aortic stenosis, at least moderate MR, normal chamber sizes.   ASSESSMENT AND PLAN:  1.  Hypertension: The patient has had history of hypotension beta-blocker and ARB was discontinued.  He is here today with a CNA has been monitoring his blood pressures.  She is not there with him each day and has had other staff who have come in and Up with his pressure.  One is an Therapist, sports who takes the blood pressure manually.  There is taken manually are normal in the 203-559 range systolic.  The other blood pressures are taken by a wrist blood pressure monitor cuff.  They were substantially lower.\  I have asked for orthostatic blood pressures which were completed by nurse here in the office.  Blood pressures were not orthostatic with lying blood pressure 139/71 heart rate 51, sitting blood pressure 124/76 with a heart rate of 62, and standing blood pressure 132/75 with a heart  rate of 75.  I suspect that his blood pressures are in accurate and show pulse readings of hypotension.  I will not add back any antihypertensive medications as he appears to be stable without them.  He will continue current medication regimen that he is on otherwise.  He will follow-up in our office in 6 months unless he is symptomatic.  2.  Coronary artery disease: Patient offers no complaints of chest pain, dyspnea.  He is very inactive and sedentary however.  He appears stable from our standpoint.  No changes in medication.   Current medicines are reviewed at length with the patient today.    Labs/ tests ordered today include:None Phill Myron. [place] Pugh, ANP, AACC   08/06/2017 11:21 AM    [facility] [address] [place], [place] [address] Phone: [phone]; Fax: [phone]

## 2017-08-06 ENCOUNTER — Encounter: Payer: Self-pay | Admitting: Adult Health

## 2017-08-06 ENCOUNTER — Ambulatory Visit: Payer: Medicare Other | Admitting: Adult Health

## 2017-08-06 VITALS — BP 119/61 | HR 66 | Ht 69.0 in | Wt 142.0 lb

## 2017-08-06 DIAGNOSIS — I2510 Atherosclerotic heart disease of native coronary artery without angina pectoris: Secondary | ICD-10-CM

## 2017-08-06 DIAGNOSIS — I10 Essential (primary) hypertension: Secondary | ICD-10-CM | POA: Diagnosis not present

## 2017-08-06 NOTE — Patient Instructions (Signed)
Medication Instructions:  NO CHANGES-NO CHANGES-Your physician recommends that you continue on your current medications as directed. Please refer to the Current Medication list given to you today.  If you need a refill on your cardiac medications before your next appointment, please call your pharmacy.  Special Instructions: PLEASE GET ARM CUFF FOR BLOOD PRESSURE READINGS  Follow-Up: Your physician wants you to follow-up in: [place] should receive a reminder letter in the mail two months in advance. If you do not receive a letter, please call our office [phone] to schedule the 01-2018 follow-up appointment.   Thank you for choosing [facility] at [facility]!!

## 2017-08-07 ENCOUNTER — Telehealth: Payer: Self-pay | Admitting: Adult Health

## 2017-08-07 NOTE — Telephone Encounter (Signed)
Left detailed message on changes from last OV (see AVS) and to call back with any other questions

## 2017-08-07 NOTE — Telephone Encounter (Signed)
New message    Patient daughter calling to discuss care plan for patient. Has questions about prognosis discussed in last appointment with Arnold Long. Please call

## 2017-08-19 ENCOUNTER — Other Ambulatory Visit (HOSPITAL_COMMUNITY): Payer: Medicare Other

## 2017-09-20 ENCOUNTER — Ambulatory Visit: Payer: Medicare Other | Admitting: Podiatry

## 2017-10-03 ENCOUNTER — Telehealth: Payer: Self-pay | Admitting: Neurology

## 2017-10-03 NOTE — Telephone Encounter (Signed)
Noted/fim 

## 2017-10-03 NOTE — Telephone Encounter (Signed)
Pt's c/a appt on 4/8. She said he is in [facility] now. FYI

## 2017-10-28 ENCOUNTER — Ambulatory Visit: Payer: Medicare Other | Admitting: Neurology

## 2017-11-21 ENCOUNTER — Ambulatory Visit: Payer: Medicare Other | Admitting: Podiatry

## 2017-12-27 ENCOUNTER — Ambulatory Visit: Payer: Medicare Other | Admitting: Podiatry

## 2017-12-27 ENCOUNTER — Encounter: Payer: Self-pay | Admitting: Podiatry

## 2017-12-27 DIAGNOSIS — M79675 Pain in left toe(s): Secondary | ICD-10-CM | POA: Diagnosis not present

## 2017-12-27 DIAGNOSIS — B351 Tinea unguium: Secondary | ICD-10-CM

## 2017-12-27 DIAGNOSIS — M79674 Pain in right toe(s): Secondary | ICD-10-CM

## 2017-12-27 NOTE — Progress Notes (Signed)
Subjective: Mr. Burroughs returns to the office on today with his twin sister accompanying him. He is seen the office today for follow up management of  painful, elongated, thickened toenails which he is unable to trim himself. His sister states she has tried to cut them in the past, but it resulted in him bleeding so she feels better with him seeing a professional. Mr. Borkenhagen nor his sister relate any acute changes since last appointment and no new complaints today.   Objective: AAO 3, NAD DP/PT pulses palpable 1/4 b/l CRT less than 3 seconds x 10 digits Toenails hypertrophic, dystrophic, elongated, brittle, discolored 10. There is tenderness overlying the nails 1-5 bilaterally. There is no surrounding erythema or drainage along the nail sites.  No open sorse today.  No open lesions or pre-ulcerative lesions are identified. No other areas of tenderness bilateral lower extremities. No overlying edema, erythema, increased warmth. No pain with calf compression, swelling, warmth, erythema.  Assessment: Patient presents with symptomatic onychomycosis  Plan: 1.Toenails sharply debrided in length and girth 10 without complication/bleeding. 2.Continue daily foot inspection. If there are any changes, to call the office immediately.  3. Continue soft, supportive shoe gear daily. 4. Report any pedal injuries to medical professional. 5. Follow-up in 3 months or sooner if any problems are to arise.

## 2018-01-16 ENCOUNTER — Ambulatory Visit: Payer: Medicare Other | Admitting: Podiatry

## 2018-02-13 IMAGING — XA IR CATHETER TUBE CHANGE
2 series · 4 of 4 positions shown · non-contrast
Comparison: none

CLINICAL DATA: Prostate cancer, urinary retention, UTIs. Indwelling
suprapubic catheter, presents for scheduled exchange

EXAM:
SUPRAPUBIC CATHETER  EXCHANGE UNDER FLUOROSCOPY
FLUOROSCOPY TIME:  30 sec, 7 mGy
TECHNIQUE: Suprapubic catheter and surrounding skin were prepped with Betadine,
draped in usual sterile fashion.

[Series 2: fl - angio · 1 of 1 slices shown]
[im 1/1]
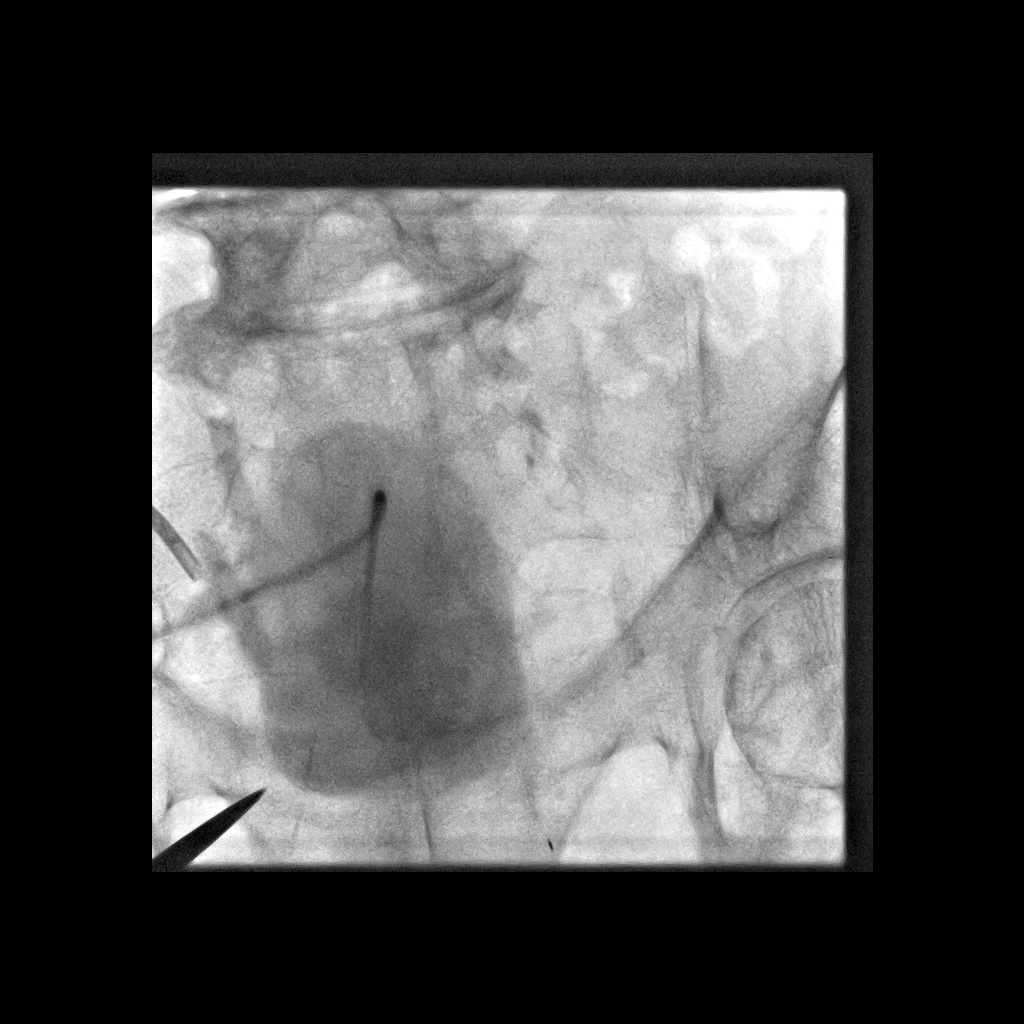

[Series 300: tube placements · 3 of 3 slices shown]
[im 1/3]
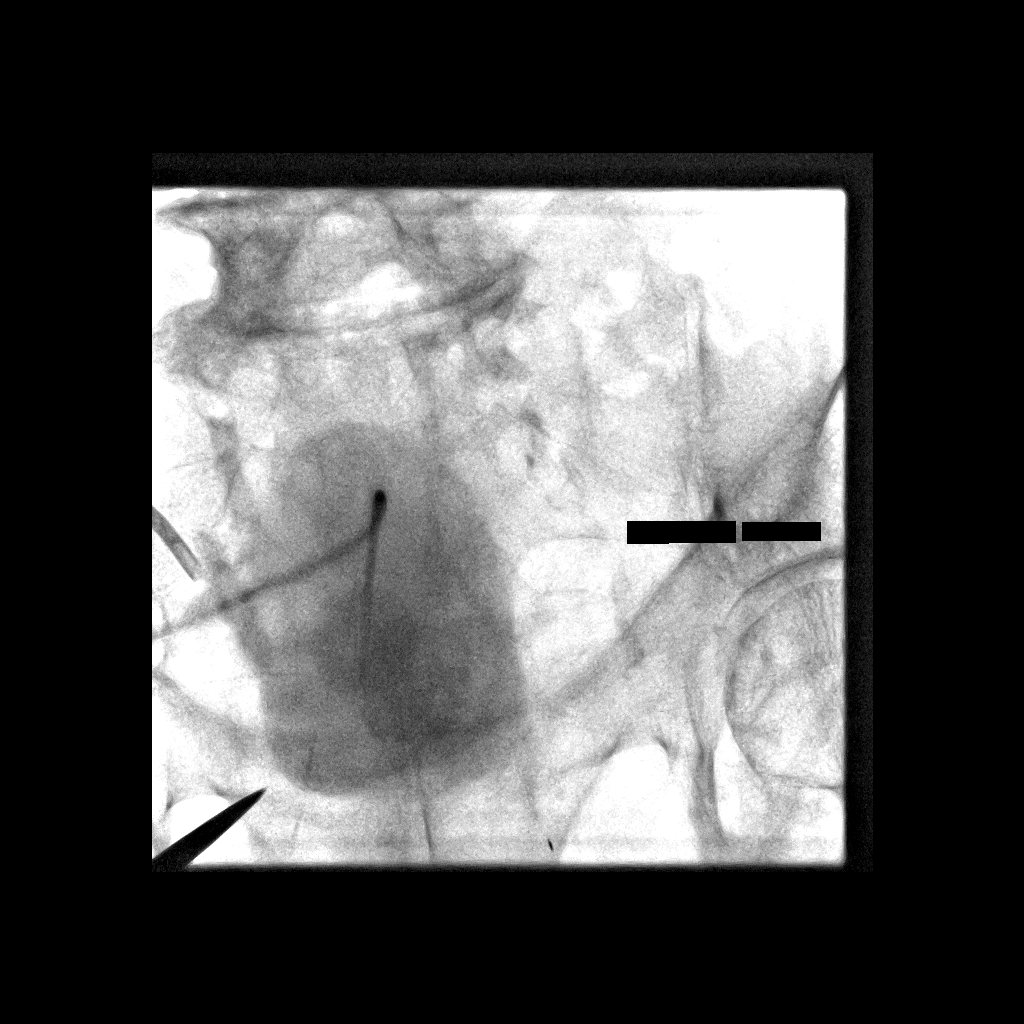
[im 2/3]
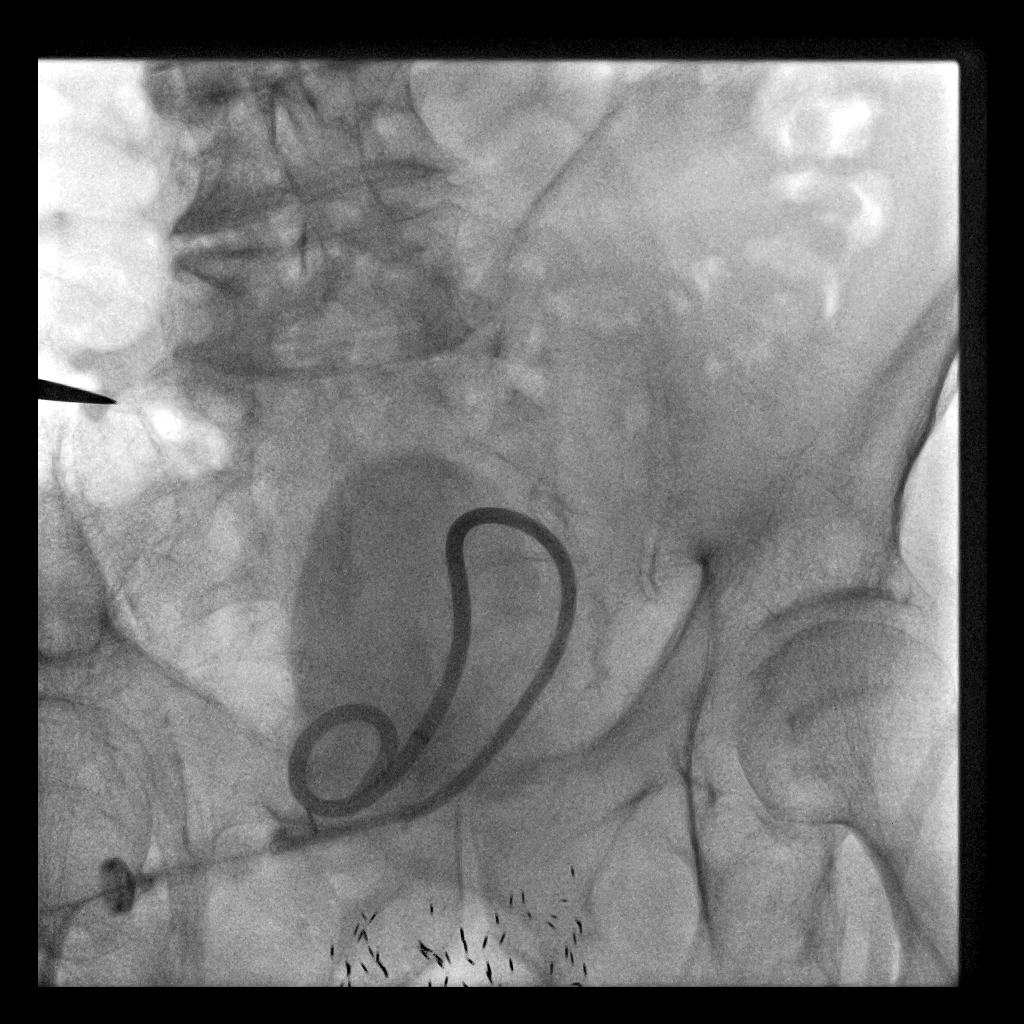
[im 3/3]
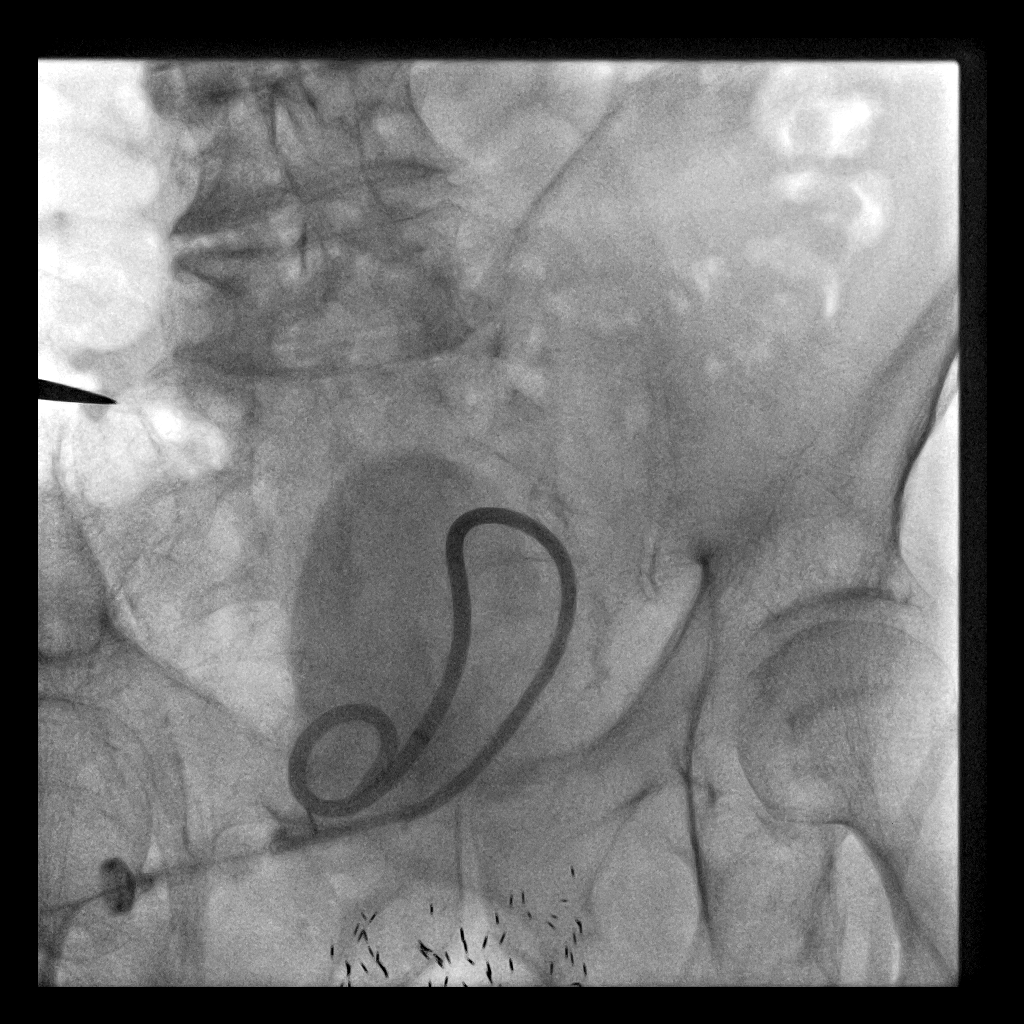

[4 of 4 positions shown; findings below may reference images not displayed]

A small amount of contrast was injected through the catheter to
opacify urinary bladder. The catheter was cut and exchanged over a
0.035" short Amplatz wire for a new 14 French balloon retention
catheter, placed centrally within the urinary bladder under
fluoroscopy. Contrast injection confirms appropriate positioning.
The retention balloon was inflated with 10 mL saline.

The patient tolerated the procedure well.

COMPLICATIONS:
None.
IMPRESSION: 1. Technically successful exchange of 14 French balloon retention
suprapubic catheter under fluoroscopy

## 2018-02-22 IMAGING — XA IR FISTULA/SINUS TRACT
3 series · 11 of 11 positions shown · non-contrast
Comparison: None.

INDICATION: Suprapubic catheter evaluation

EXAM:
SINUS TRACT INJECTION / FISTULOGRAM
TECHNIQUE: Informed written consent was obtained from the patient after a
thorough discussion of the procedural risks, benefits and
alternatives. All questions were addressed. Maximal Sterile Barrier
Technique was utilized including caps, mask, sterile gowns, sterile
gloves, sterile drape, hand hygiene and skin antiseptic. A timeout
was performed prior to the initiation of the procedure.
Contrast was injected into the suprapubic catheter and imaging was
obtained.
PROCEDURE:
Images were obtained.

[Series 2: fl - angio · 4 of 86 frames shown (1 of 2)]
[frame 12/86]
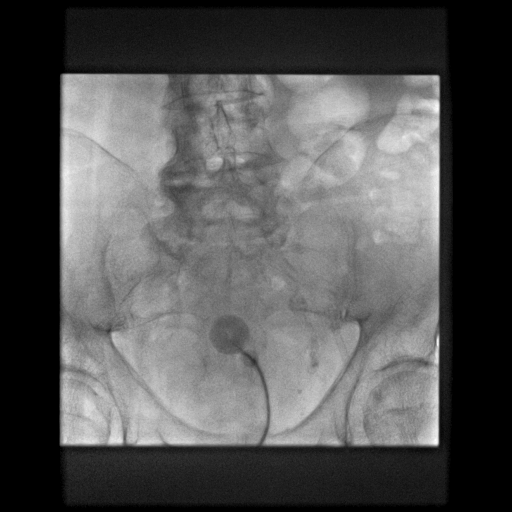
[frame 13/86]
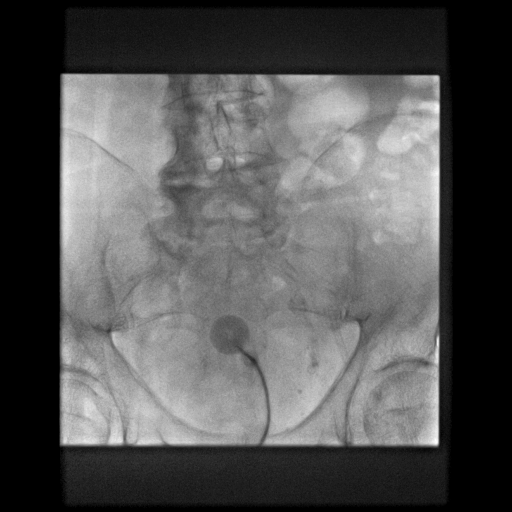
[frame 44/86]
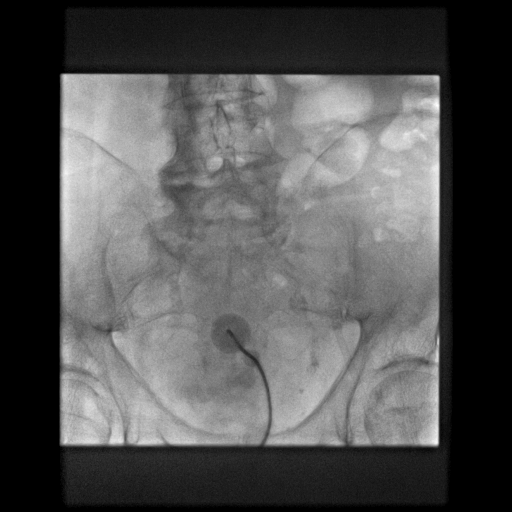
[frame 74/86]
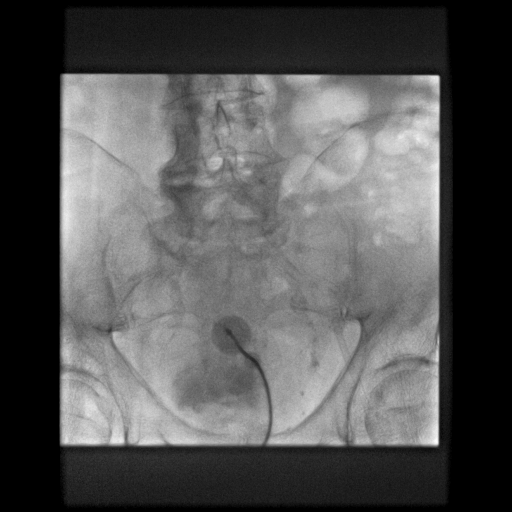

[Series 3: fl - angio · 4 of 75 frames shown (2 of 2)]
[frame 12/75]
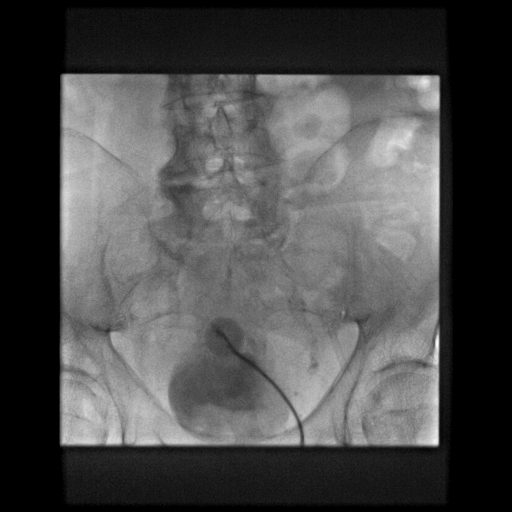
[frame 38/75]
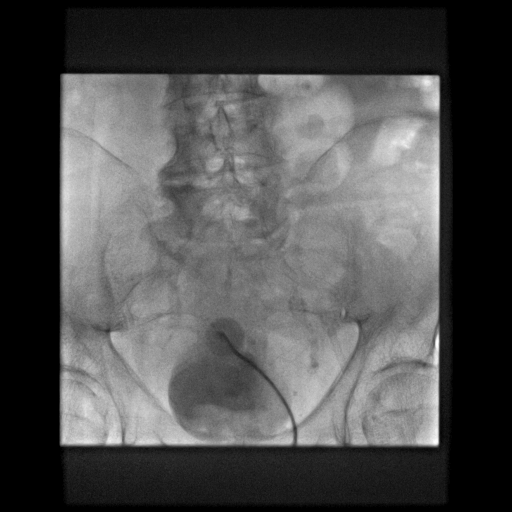
[frame 64/75]
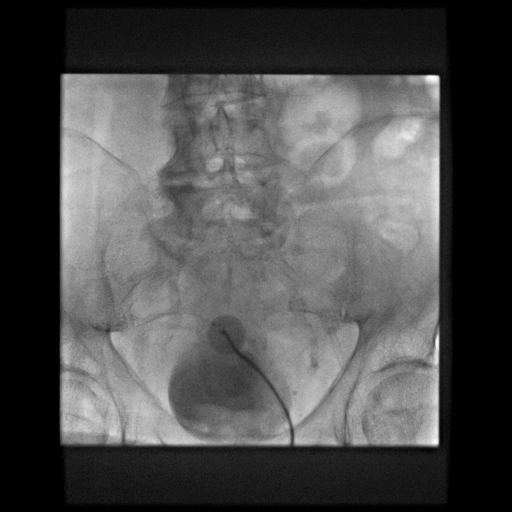
[frame 75/75]
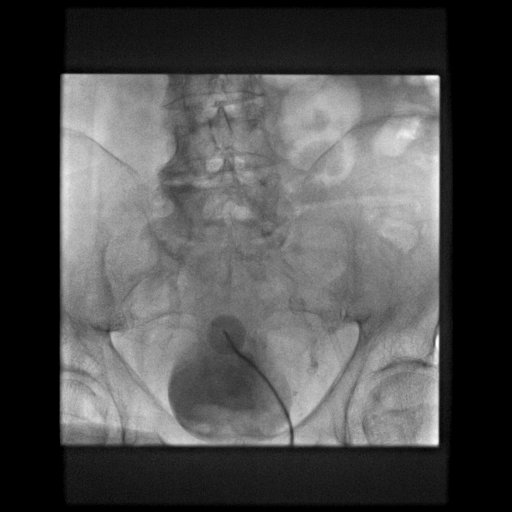

[Series 300: tube placements · 3 of 3 slices shown]
[im 1/3]
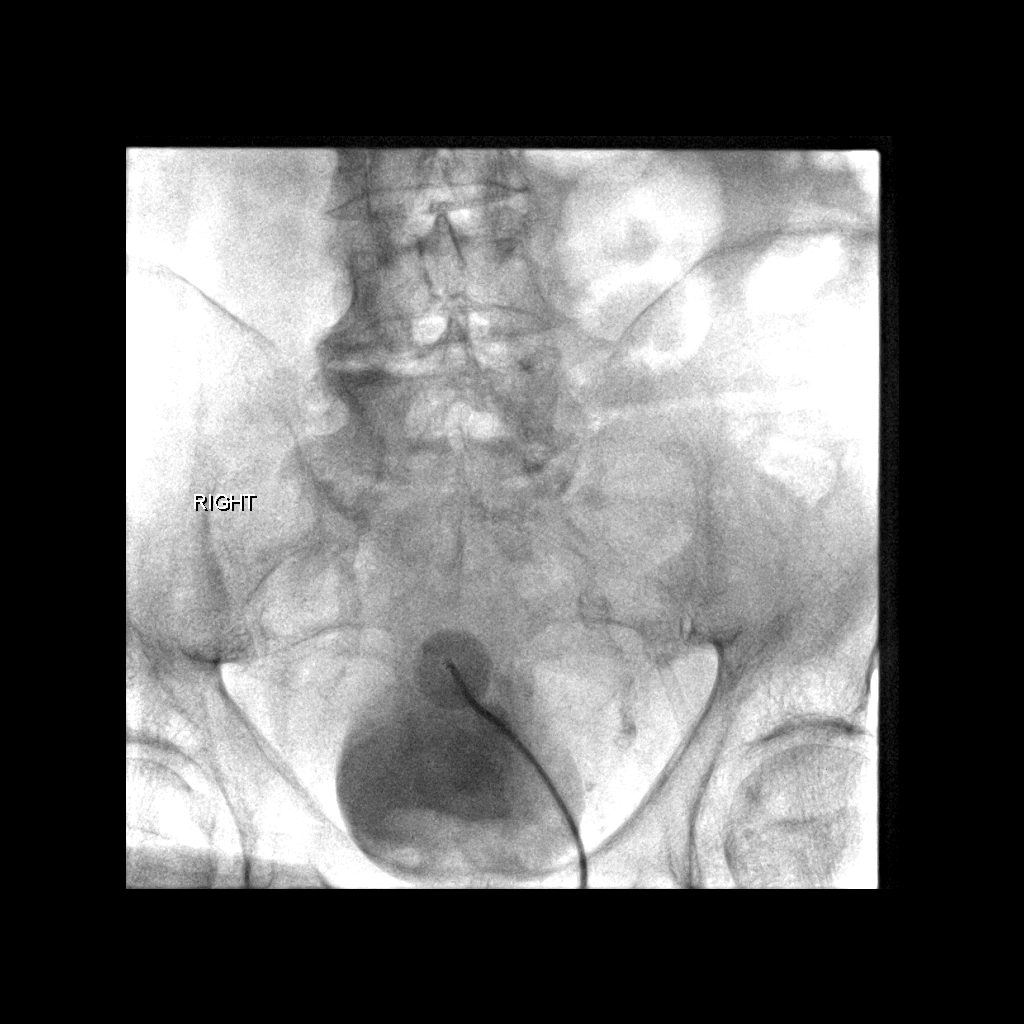
[im 2/3]
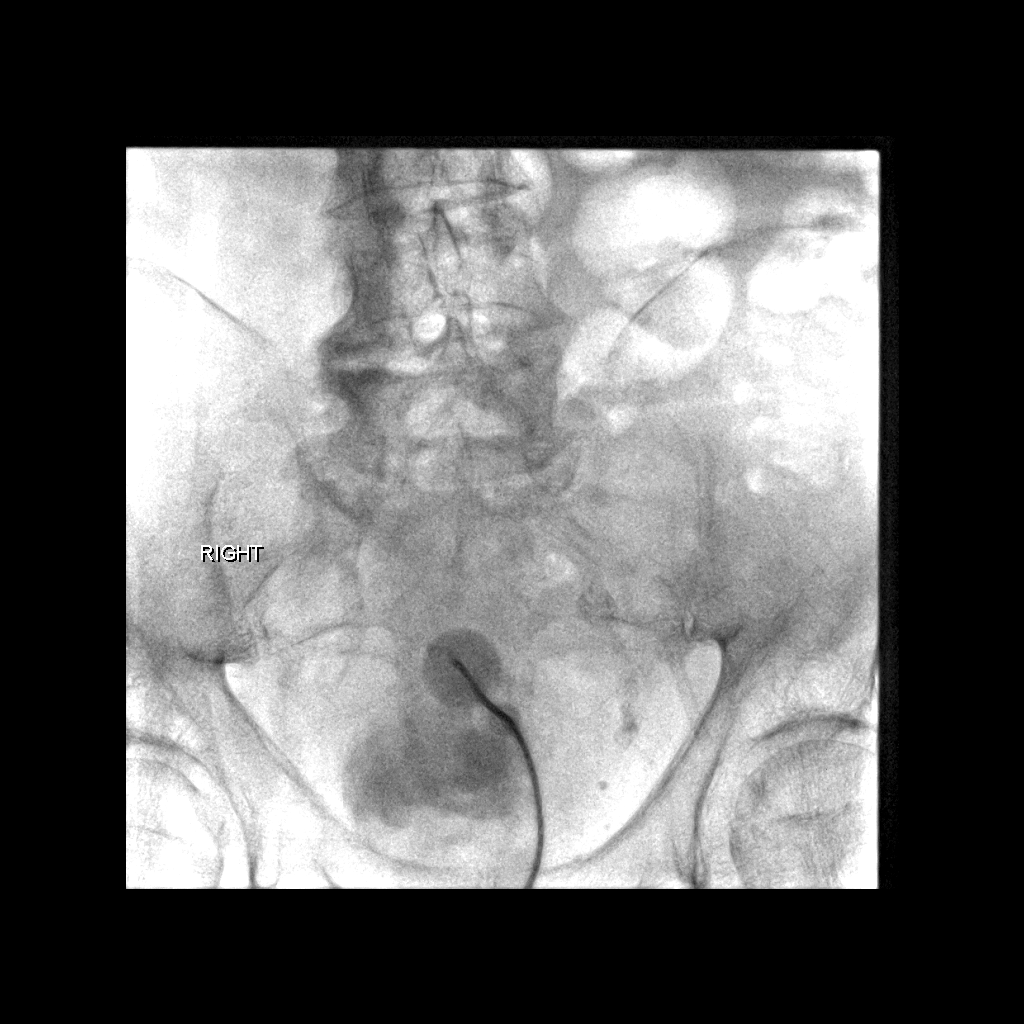
[im 3/3]
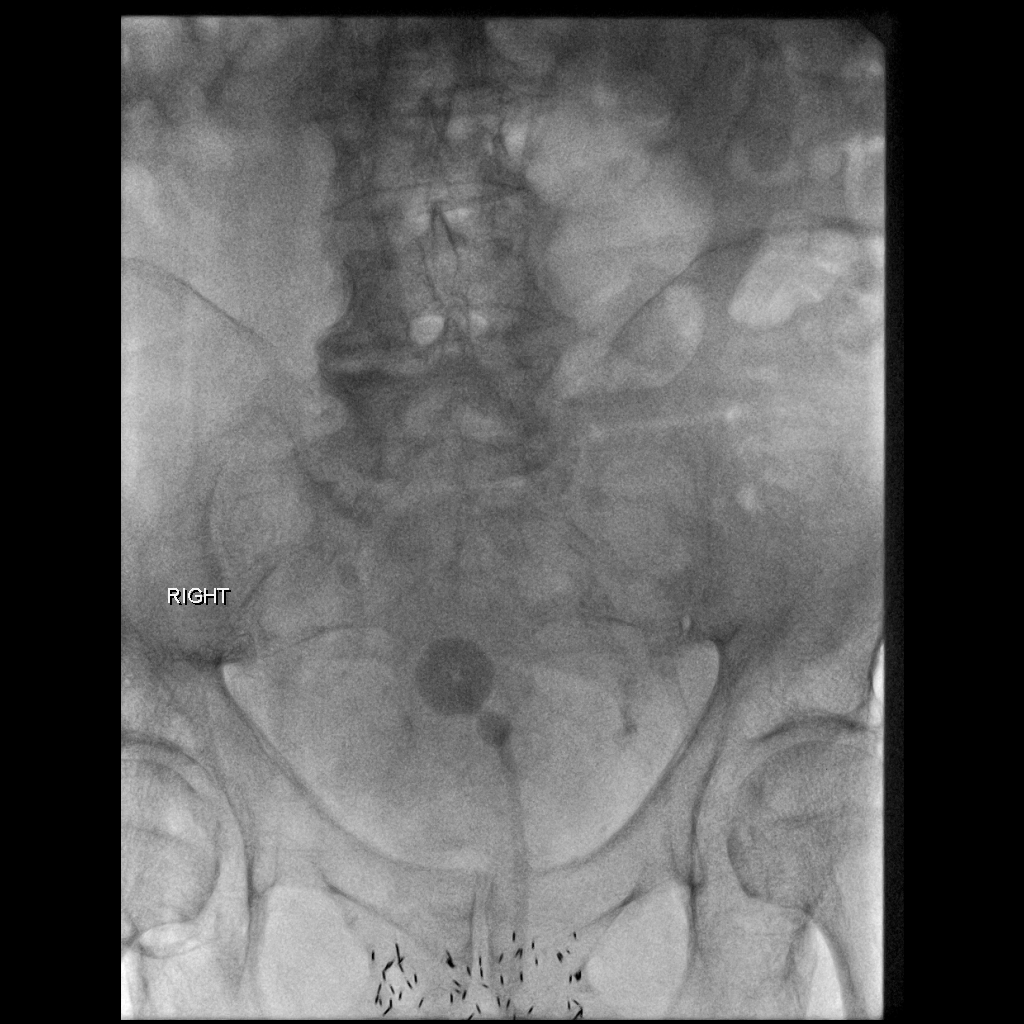

[11 of 11 positions shown; findings below may reference images not displayed]

MEDICATIONS:
The patient is currently admitted to the hospital and receiving
intravenous antibiotics. The antibiotics were administered within an
appropriate time frame prior to the initiation of the procedure.

ANESTHESIA/SEDATION:
None.

COMPLICATIONS:
None immediate.
FINDINGS: The suprapubic catheter is positioned within the lumen of the
bladder. Contrast fills the bladder. It is patent without evidence
of extravasation.
IMPRESSION: The suprapubic catheter is functioning and is appropriately
positioned within the bladder.

## 2018-03-20 ENCOUNTER — Ambulatory Visit: Payer: Medicare Other | Admitting: Podiatry

## 2018-03-21 ENCOUNTER — Ambulatory Visit: Payer: Medicare Other | Admitting: Podiatry

## 2018-03-21 ENCOUNTER — Ambulatory Visit (INDEPENDENT_AMBULATORY_CARE_PROVIDER_SITE_OTHER): Payer: Medicare Other | Admitting: Podiatry

## 2018-03-21 DIAGNOSIS — B351 Tinea unguium: Secondary | ICD-10-CM

## 2018-03-21 DIAGNOSIS — M79675 Pain in left toe(s): Secondary | ICD-10-CM

## 2018-03-21 DIAGNOSIS — M79674 Pain in right toe(s): Secondary | ICD-10-CM | POA: Diagnosis not present

## 2018-03-21 NOTE — Progress Notes (Signed)
Subjective: Henry Curtis is a 84 y.o. y.o. male who presents today for followup wof painful, discolored, thick toenails both feet which interfere with daily activities. Pain is aggravated when wearing enclosed shoe gear. Pain is relieved with periodic professional debridement.  He is accompanied by his sister today. She relates concern of edema both ankles. She is unsure how long he has had swelling as she does not live with him. She states she will call Hospice and inform them.    Objective: Vascular Examination: AAO 3, NAD No digital hair b/l DP/PT pulses palpable 1/4 b/l CRT less than 3 seconds x 10 digits +1 edema settling around both ankles and right leg He does have scars medial legs from vein grafts b/l for heart surgery  No pain with calf compression, swelling, warmth, erythema.  Dermatological Examination: Skin is thin, shiny and atrophic Toenails 1-5 b/l discolored, thick, dystrophic with subungual debris and pain with palpation to nailbeds due to thickness of nails.   Musculoskeletal: Muscle strength 5/5 to all LE muscle groups  Neurological: Sensation intact with 10 gram monofilament. Vibratory sensation intact.  Assessment: Painful onychomycosis toenails 1-5 b/l in patient on blood thinner.  Edema b/l LE without cellulitis  Plan: 1. Toenails 1-5 b/l were debrided in length and girth without iatrogenic bleeding. 2. Patient to continue soft, supportive shoe gear 3. Patient to report any pedal injuries to medical professional immediately. 4. Avoid self trimming due to use of blood thinner. 5. Follow up 3 months.  6. Patient/POA to call should there be a concern in the interim.

## 2018-03-24 ENCOUNTER — Encounter: Payer: Self-pay | Admitting: Podiatry

## 2018-05-30 ENCOUNTER — Ambulatory Visit (INDEPENDENT_AMBULATORY_CARE_PROVIDER_SITE_OTHER): Payer: Medicare Other | Admitting: Podiatry

## 2018-05-30 ENCOUNTER — Ambulatory Visit: Payer: Medicare Other | Admitting: Podiatry

## 2018-05-30 DIAGNOSIS — M79675 Pain in left toe(s): Secondary | ICD-10-CM | POA: Diagnosis not present

## 2018-05-30 DIAGNOSIS — B351 Tinea unguium: Secondary | ICD-10-CM

## 2018-05-30 DIAGNOSIS — M79674 Pain in right toe(s): Secondary | ICD-10-CM

## 2018-05-30 NOTE — Patient Instructions (Signed)

## 2018-06-16 ENCOUNTER — Encounter: Payer: Self-pay | Admitting: Podiatry

## 2018-06-16 NOTE — Progress Notes (Signed)
Subjective: Henry Curtis presents today with painful, thick toenails 1-5 b/l that he cannot cut and which interfere with daily activities.  Pain is aggravated when wearing enclosed shoe gear.  Objective: Vascular Examination: Capillary refill time <3 seconds x 10 digits Dorsalis pedis palpable b/l Posterior tibial pulses palpable b/l Digital hair x 10 digits absent Skin temperature gradient WNL b/l Scars medial legs from vein grafts b/l for heart surgery  Dermatological Examination: Skin is thin, shiny and atrophic b/l  Toenails 1-5 b/l discolored, thick, dystrophic with subungual debris and pain with palpation to nailbeds due to thickness of nails.  Musculoskeletal: Muscle strength 5/5 to all LE muscle groups  Neurological: Sensation intact with 10 gram monofilament. Vibratory sensation intact.  Assessment: Painful onychomycosis toenails 1-5 b/l   Plan: 1. Toenails 1-5 b/l were debrided in length and girth without iatrogenic bleeding. 2. Patient to continue soft, supportive shoe gear 3. Patient to report any pedal injuries to medical professional immediately. 4. Follow up 3 months. Patient/POA to call should there be a concern in the interim.

## 2018-08-01 ENCOUNTER — Ambulatory Visit: Payer: Medicare Other | Admitting: Podiatry

## 2018-08-05 ENCOUNTER — Ambulatory Visit: Payer: Self-pay | Admitting: Podiatry

## 2018-08-13 ENCOUNTER — Ambulatory Visit (INDEPENDENT_AMBULATORY_CARE_PROVIDER_SITE_OTHER): Payer: Medicare Other | Admitting: Podiatry

## 2018-08-13 DIAGNOSIS — M79675 Pain in left toe(s): Secondary | ICD-10-CM

## 2018-08-13 DIAGNOSIS — M79674 Pain in right toe(s): Secondary | ICD-10-CM

## 2018-08-13 DIAGNOSIS — B351 Tinea unguium: Secondary | ICD-10-CM

## 2018-08-13 NOTE — Patient Instructions (Signed)

## 2018-08-19 DIAGNOSIS — C7951 Secondary malignant neoplasm of bone: Secondary | ICD-10-CM | POA: Diagnosis not present

## 2018-08-19 DIAGNOSIS — N35014 Post-traumatic urethral stricture, male, unspecified: Secondary | ICD-10-CM | POA: Diagnosis not present

## 2018-08-19 DIAGNOSIS — Z8546 Personal history of malignant neoplasm of prostate: Secondary | ICD-10-CM | POA: Diagnosis not present

## 2018-08-19 DIAGNOSIS — C61 Malignant neoplasm of prostate: Secondary | ICD-10-CM | POA: Diagnosis not present

## 2018-08-19 DIAGNOSIS — R339 Retention of urine, unspecified: Secondary | ICD-10-CM | POA: Diagnosis not present

## 2018-08-19 DIAGNOSIS — R310 Gross hematuria: Secondary | ICD-10-CM | POA: Diagnosis not present

## 2018-08-25 ENCOUNTER — Encounter: Payer: Self-pay | Admitting: Podiatry

## 2018-08-25 NOTE — Progress Notes (Signed)
Subjective: Henry Curtis presents today with painful, thick toenails 1-5 b/l and which interfere with daily activities.  Pain is aggravated when wearing enclosed shoe gear.  Pain is relieved with periodic professional debridement.  Henry Curtis voices no new concerns on today's visit.  Henry Pretty, MD is his PCP.   Current Outpatient Medications:  .  aspirin 81 MG tablet, Take 81 mg by mouth daily., Disp: , Rfl:  .  atorvastatin (LIPITOR) 40 MG tablet, Take 40 mg by mouth daily., Disp: , Rfl:  .  Cholecalciferol (VITAMIN D-3 PO), Take 1,000 Units by mouth daily. , Disp: , Rfl:  .  Cyanocobalamin (VITAMIN B-12 PO), Take 1,000 mcg by mouth daily. , Disp: , Rfl:  .  donepezil (ARICEPT) 10 MG tablet, Take 1 tablet (10 mg total) by mouth at bedtime., Disp: 30 tablet, Rfl: 5 .  finasteride (PROSCAR) 5 MG tablet, Take 5 mg by mouth daily., Disp: , Rfl:  .  Melatonin 10 MG TABS, Take 10 mg by mouth at bedtime. , Disp: , Rfl:  .  Omega-3 Fatty Acids (FISH OIL) 1200 MG CAPS, Take 1,200 mg by mouth daily., Disp: , Rfl:  .  Probiotic Product (PROBIOTIC & ACIDOPHILUS EX ST PO), Take 1 tablet by mouth 2 (two) times daily. , Disp: , Rfl:   No Known Allergies  Objective:  Vascular Examination: Capillary refill time immediate x 10 digits  Dorsalis pedis and Posterior tibial pulses palpable b/l  Digital hair absent x 10 digits  Skin temperature gradient WNL b/l  Dermatological Examination: Skin is thin, shiny, and atrophic bilaterally.  Well-healed surgical scars medial aspect of both legs from vein grafts.  Toenails 1-5 b/l discolored, thick, dystrophic with subungual debris and pain with palpation to nailbeds due to thickness of nails.  Musculoskeletal: Muscle strength 5/5 to all LE muscle groups  No gross bony deformities b/l.  No pain, crepitus or joint limitation noted with ROM.   Neurological: Sensation intact with 10 gram monofilament.  Vibratory sensation  intact.  Assessment: Painful onychomycosis toenails 1-5 b/l   Plan: 1. Toenails 1-5 b/l were debrided in length and girth without iatrogenic bleeding. 2. Patient to continue soft, supportive shoe gear 3. Patient to report any pedal injuries to medical professional immediately. 4. Follow up 3 months. Patient/POA to call should there be a concern in the interim.

## 2018-09-03 ENCOUNTER — Ambulatory Visit: Payer: Medicare Other | Admitting: Podiatry

## 2018-10-13 ENCOUNTER — Ambulatory Visit: Payer: Self-pay | Admitting: Podiatry

## 2018-10-14 ENCOUNTER — Ambulatory Visit: Payer: Self-pay | Admitting: Podiatry

## 2018-10-16 ENCOUNTER — Ambulatory Visit: Payer: Self-pay | Admitting: Podiatry

## 2018-12-01 ENCOUNTER — Other Ambulatory Visit: Payer: Self-pay

## 2018-12-01 ENCOUNTER — Encounter: Payer: Self-pay | Admitting: Podiatry

## 2018-12-01 ENCOUNTER — Ambulatory Visit (INDEPENDENT_AMBULATORY_CARE_PROVIDER_SITE_OTHER): Payer: Medicare Other | Admitting: Podiatry

## 2018-12-01 DIAGNOSIS — B351 Tinea unguium: Secondary | ICD-10-CM

## 2018-12-01 DIAGNOSIS — M79674 Pain in right toe(s): Secondary | ICD-10-CM | POA: Diagnosis not present

## 2018-12-01 DIAGNOSIS — M79675 Pain in left toe(s): Secondary | ICD-10-CM

## 2018-12-07 ENCOUNTER — Encounter: Payer: Self-pay | Admitting: Podiatry

## 2018-12-07 NOTE — Progress Notes (Signed)
Subjective: Henry Curtis presents today with painful, thick toenails 1-5 b/l that he cannot cut and which interfere with daily activities.  Pain is aggravated when wearing enclosed shoe gear.   Current Outpatient Medications:  .  aspirin 81 MG tablet, Take 81 mg by mouth daily., Disp: , Rfl:  .  atorvastatin (LIPITOR) 40 MG tablet, Take 40 mg by mouth daily., Disp: , Rfl:  .  Cholecalciferol (VITAMIN D-3 PO), Take 1,000 Units by mouth daily. , Disp: , Rfl:  .  Cyanocobalamin (VITAMIN B-12 PO), Take 1,000 mcg by mouth daily. , Disp: , Rfl:  .  donepezil (ARICEPT) 10 MG tablet, Take 1 tablet (10 mg total) by mouth at bedtime., Disp: 30 tablet, Rfl: 5 .  finasteride (PROSCAR) 5 MG tablet, Take 5 mg by mouth daily., Disp: , Rfl:  .  Melatonin 10 MG TABS, Take 10 mg by mouth at bedtime. , Disp: , Rfl:  .  Omega-3 Fatty Acids (FISH OIL) 1200 MG CAPS, Take 1,200 mg by mouth daily., Disp: , Rfl:  .  Probiotic Product (PROBIOTIC & ACIDOPHILUS EX ST PO), Take 1 tablet by mouth 2 (two) times daily. , Disp: , Rfl:   No Known Allergies  Objective:  Vascular Examination: Capillary refill time immediate x 10 digits.  Dorsalis pedis and Posterior tibial pulses palpable b/l.  Digital hair absent x 10 digits.  Skin temperature gradient WNL b/l.  Dermatological Examination: Skin is thin, shiny and atrophic b/l.  Toenails 1-5 b/l discolored, thick, dystrophic with subungual debris and pain with palpation to nailbeds due to thickness of nails.  Musculoskeletal: Muscle strength 5/5 to all LE muscle groups.  No gross bony deformities b/l.  No pain, crepitus or joint limitation noted with ROM.   Neurological: Sensation intact with 10 gram monofilament.  Vibratory sensation intact.  Assessment: Painful onychomycosis toenails 1-5 b/l   Plan: 1. Toenails 1-5 b/l were debrided in length and girth without iatrogenic bleeding. 2. Patient to continue soft, supportive shoe gear daily. 3. Patient  to report any pedal injuries to medical professional immediately. 4. Follow up 3 months.  5. Patient/POA to call should there be a concern in the interim.

## 2019-03-04 ENCOUNTER — Ambulatory Visit: Payer: Medicare Other | Admitting: Podiatry

## 2019-05-24 DEATH — deceased
